# Patient Record
Sex: Male | Born: 1960
Health system: Southern US, Community
[De-identification: ages and names within clinical notes are randomized; demographics above are authoritative.]

## PROBLEM LIST (undated history)

## (undated) DIAGNOSIS — Z8709 Personal history of other diseases of the respiratory system: Secondary | ICD-10-CM

## (undated) DIAGNOSIS — J189 Pneumonia, unspecified organism: Secondary | ICD-10-CM

## (undated) DIAGNOSIS — C61 Malignant neoplasm of prostate: Secondary | ICD-10-CM

## (undated) DIAGNOSIS — A419 Sepsis, unspecified organism: Secondary | ICD-10-CM

## (undated) DIAGNOSIS — M1990 Unspecified osteoarthritis, unspecified site: Secondary | ICD-10-CM

## (undated) DIAGNOSIS — Z87440 Personal history of urinary (tract) infections: Secondary | ICD-10-CM

## (undated) DIAGNOSIS — K8020 Calculus of gallbladder without cholecystitis without obstruction: Secondary | ICD-10-CM

## (undated) DIAGNOSIS — R079 Chest pain, unspecified: Secondary | ICD-10-CM

## (undated) DIAGNOSIS — E7800 Pure hypercholesterolemia, unspecified: Secondary | ICD-10-CM

## (undated) DIAGNOSIS — Z87442 Personal history of urinary calculi: Secondary | ICD-10-CM

## (undated) DIAGNOSIS — G4730 Sleep apnea, unspecified: Secondary | ICD-10-CM

## (undated) HISTORY — DX: Chest pain, unspecified: R07.9

## (undated) HISTORY — DX: Pure hypercholesterolemia, unspecified: E78.00

---

## 2000-12-23 ENCOUNTER — Encounter: Admission: RE | Admit: 2000-12-23 | Discharge: 2000-12-23 | Payer: Self-pay | Admitting: Urology

## 2000-12-23 ENCOUNTER — Encounter: Payer: Self-pay | Admitting: Urology

## 2001-01-14 ENCOUNTER — Encounter: Payer: Self-pay | Admitting: Urology

## 2001-01-14 ENCOUNTER — Ambulatory Visit (HOSPITAL_COMMUNITY): Admission: RE | Admit: 2001-01-14 | Discharge: 2001-01-14 | Payer: Self-pay | Admitting: Urology

## 2006-07-14 ENCOUNTER — Ambulatory Visit (HOSPITAL_BASED_OUTPATIENT_CLINIC_OR_DEPARTMENT_OTHER): Admission: RE | Admit: 2006-07-14 | Discharge: 2006-07-14 | Payer: Self-pay | Admitting: Orthopedic Surgery

## 2013-07-03 ENCOUNTER — Encounter: Payer: Self-pay | Admitting: *Deleted

## 2013-07-03 ENCOUNTER — Ambulatory Visit (INDEPENDENT_AMBULATORY_CARE_PROVIDER_SITE_OTHER): Payer: 59 | Admitting: Cardiovascular Disease

## 2013-07-03 ENCOUNTER — Encounter: Payer: Self-pay | Admitting: Cardiovascular Disease

## 2013-07-03 ENCOUNTER — Ambulatory Visit (INDEPENDENT_AMBULATORY_CARE_PROVIDER_SITE_OTHER)
Admission: RE | Admit: 2013-07-03 | Discharge: 2013-07-03 | Disposition: A | Discharge status: Home / Self Care | Payer: 59 | Source: Ambulatory Visit | Attending: Cardiovascular Disease | Admitting: Cardiovascular Disease

## 2013-07-03 VITALS — BP 120/80 | HR 78 | Ht 73.0 in | Wt 234.0 lb

## 2013-07-03 DIAGNOSIS — R079 Chest pain, unspecified: Secondary | ICD-10-CM

## 2013-07-03 DIAGNOSIS — E7800 Pure hypercholesterolemia, unspecified: Secondary | ICD-10-CM | POA: Insufficient documentation

## 2013-07-03 DIAGNOSIS — F17200 Nicotine dependence, unspecified, uncomplicated: Secondary | ICD-10-CM

## 2013-07-03 NOTE — Assessment & Plan Note (Signed)
Cholesterol is at goal.  Continue current dose of statin and diet Rx.  No myalgias or side effects.  F/U  LFT's in 6 months. No results found for this basename: [place]  See HPI target ok if no vascular disease and ETT normal

## 2013-07-03 NOTE — Assessment & Plan Note (Signed)
Counseled for less than 10 minutes Difficult to quit f/u Alroy Dust CXR today

## 2013-07-03 NOTE — Progress Notes (Signed)
Patient ID: Xavier Goodwin, male   DOB: April 10, 1960, 53 y.o.   MRN: [id number]   53 yo referred by [name] for chest pain  Present for 2-3 weeks.  Sharp pressing pain radiates to left shoulder Occasionally accompanied by dyspnea Last only minutes.  Not always exertional No pleuritic or positional component No GI overtones.  CRF;s family history and elevated lipids on ASA and statin   Smoker has used e cigs to try to Quit but gave him headache.  Has not had CXR in a long time   Reviewed labs from 02/07/13  Normal LFTls  LDL 129  TC 191 HDL 45     ROS: Denies fever, malais, weight loss, blurry vision, decreased visual acuity, cough, sputum, SOB, hemoptysis, pleuritic pain, palpitaitons, heartburn, abdominal pain, melena, lower extremity edema, claudication, or rash.  All other systems reviewed and negative   General: Affect appropriate Healthy:  appears stated age 59: normal Neck supple with no adenopathy JVP normal no bruits no thyromegaly Lungs clear with no wheezing and good diaphragmatic motion Heart:  S1/S2 no murmur,rub, gallop or click PMI normal Abdomen: benighn, BS positve, no tenderness, no AAA no bruit.  No HSM or HJR Distal pulses intact with no bruits No edema Neuro non-focal Skin warm and dry No muscular weakness  Medications Current Outpatient Prescriptions  Medication Sig Dispense Refill  . aspirin 81 MG tablet Take 81 mg by mouth as needed for pain.      Marland Kitchen CRESTOR 40 MG tablet Take 1 tablet by mouth daily.       No current facility-administered medications for this visit.    Allergies Review of patient's allergies indicates no known allergies.  Family History: Family History  Problem Relation Age of Onset  . Hypertension Father   . Diabetes Mother   . Cancer Mother   . Lupus Sister     Social History: History   Social History  . Marital Status: Married    Spouse Name: N/A    Number of Children: N/A  . Years of Education: N/A    Occupational History  . Not on file.   Social History Main Topics  . Smoking status: Current Every Day Smoker  . Smokeless tobacco: Not on file  . Alcohol Use: Yes  . Drug Use: No  . Sexual Activity: Not on file   Other Topics Concern  . Not on file   Social History Narrative  . No narrative on file    Electrocardiogram:  SR rate 70 early repolarization   Assessment and Plan

## 2013-07-03 NOTE — Assessment & Plan Note (Signed)
Atypical multiple CRFls  F/u ETT since ECG is normal

## 2013-07-03 NOTE — Patient Instructions (Signed)
Your physician wants you to follow-up in: [place] will receive a reminder letter in the mail two months in advance. If you don't receive a letter, please call our office to schedule the follow-up appointment.  Your physician recommends that you continue on your current medications as directed. Please refer to the Current Medication list given to you today.  A chest x-ray takes a picture of the organs and structures inside the chest, including the heart, lungs, and blood vessels. This test can show several things, including, whether the heart is enlarges; whether fluid is building up in the lungs; and whether pacemaker / defibrillator leads are still in place.  Your physician has requested that you have an exercise tolerance test. For further information please visit [email or web address]. Please also follow instruction sheet, as given.

## 2013-07-05 ENCOUNTER — Ambulatory Visit (HOSPITAL_COMMUNITY)
Admission: RE | Admit: 2013-07-05 | Discharge: 2013-07-05 | Disposition: A | Discharge status: Home / Self Care | Payer: 59 | Source: Ambulatory Visit | Attending: Cardiovascular Disease | Admitting: Cardiovascular Disease

## 2013-07-05 DIAGNOSIS — R079 Chest pain, unspecified: Secondary | ICD-10-CM

## 2013-07-10 ENCOUNTER — Telehealth: Payer: Self-pay | Admitting: *Deleted

## 2013-07-10 DIAGNOSIS — R9439 Abnormal result of other cardiovascular function study: Secondary | ICD-10-CM

## 2013-07-10 NOTE — Telephone Encounter (Signed)
[name] ','<More Detail >>       Josue Hector, MD       Sent: Sun July 09, 2013 12:22 PM    To: Richmond Campbell, LPN                   Message     ETT abnormal F/U stress [facility] Xavier Goodwin  MRN: [id number]  07/05/2013 Hospital Visit         Patient Demographics and Encounter Information      Patient Demographics     Patient Name Sex DOB Age SSN Address Phone    Xavier Goodwin, Xavier Goodwin Male 1960-07-07 (53 y.o.) [id number] [place] [address] [phone] (Home) [phone] (Work)           Hospital Account# [id number]     [facility]           Progress Notes     No notes of this type exist for this encounter.          Encounter-Level Documents:     Electronic signature on 07/05/2013 3:13 PM    Electronic signature on 07/05/2013 3:14 PM    Electronic signature on 07/05/2013 3:14 PM           ED Chart Summary     ED Chart Summary Report           Reason for Admission     Chest pain  786.50           Admission Information     Attending Provider Admitting Provider Admission Type Admission Date/Time      Elective 07/05/13 1519     Discharge Date Hospital Service Auth/Cert Status Service Area    07/05/13   [place]     Unit Room/Bed Admission Status Referring Provider    [facility] Outpatient Visit (Completed) Josue Hector, MD     [facility]    Physician or Clinic Referral                Events     Date/Time Event Pt Class Unit Room/Bed Service    07/05/13 [facility] Outpatient Outpatient [facility]       07/05/13 2359 Discharge Outpatient [facility] [place]              Discharge Information - Patient Record Only     Discharge Date/Time Discharge Disposition Discharge Destination Discharge Provider Unit    07/05/2013  2359 01-home Or Self Care None None [facility] Im[facility]           Problem List Never Reviewed         ICD-9-CM Priority Class Noted - Resolved    Hypercholesteremia 272.0   Unknown - Present         Entered by Burnett Kanaris, RMA    Smoker 305.1   07/03/2013 - Present         Entered by Jenkins Rouge, MD    Chest pain 786.50   07/03/2013 - Present         Entered by Jenkins Rouge, MD           Final Diagnoses (ICD-9-CM)     Principal Code Name  POA CC HAC Affects DRG    [P] 786.50 Chest pain, unspecified               Treatment Team     Treatment Team            Documentation and Orders      Allergies as of 07/05/2013 Review Complete On: 07/03/2013 By: Burnett Kanaris, RMA     No Known Allergies          PTA Med History     PTA Med History Report           Medical Past Medical History Date Comments Source   Hypercholesteremia [272.0]   Provider   Chest pain [786.50]   Provider          Surgical **None**            Family Problem Relation Name Age of Onset Comments Source   Hypertension Father    Provider   Diabetes Mother    Provider   Cancer Mother    Provider   Lupus Sister    Provider          Family Status **None**            Tobacco Use Smoking Status Source Types Packs/day Years Used Comments Smoking Start Date Smoking Quit Date Smokeless Tobacco Status Smokeless Tobacco Quit Date   Current Every Day Smoker Provider  0.0 0.0    Unknown           Alcohol Use Alcohol Use Source Drinks/Week Alcohol/Wk Comments   Yes Provider             Drug Use Drug Use Source Types Frequency Comments   No Provider  0.00           Sexual Activity Sexually Active Source Birth Control Partners Comments   Not Asked Provider             Social ADL ADL Question Response Comments Source   **None**          Social Doc **None**            Occupational **None**            Socioeconomic Marital Status  Spouse Name Num of Children Years Education Source   Married       Preferred Language Ethnicity Race     English Not Hispanic or Latino Black or African American          Birth **None**         Discharge Summary Note     Discharge Summary          H&P Notes     No notes of this type exist for this encounter.          Procedure Notes     No notes of this type exist for this encounter.          Consult Notes     No notes of this type exist for this encounter.          Note Information     No notes recorded in this admission.          OR Perioperative Record     OR Perioperative Record          Anesthesia Record Report     Anesthesia Record          All Flowsheet Templates (all recorded)     None  Order Information     Orders Report Results Report          MAR History by Date Range      All administrations    No administration data available           Isolation     No Isolation          Care Plan     Care Plan Report          Patient Education     Patient Education Report          Documents on File       Status Date Received Description    Documents for the Patient    [place] HIPAA NOTICE OF PRIVACY - Scanned Not Received      [place] E-Signature HIPAA Notice of Privacy       Driver's License Not Received      Insurance Card Not Received      Advance Directives/Living Will/HCPOA/POA Not Received      Insurance Card       Insurance Card Received 07/03/13     [place] E-Signature HIPAA Notice of Privacy Received 07/03/13 [facility]/CARD    Documents for the Encounter    AOB (Assignment of Insurance Benefits) Not Received      E-signature AOB Received 07/05/13     MEDICARE RIGHTS Not Received      E-signature Medicare       E-Signature Physician Office Financial Release of Information Received 07/05/13     [facility] Received 07/05/13              Discharge Documentation and Orders      Discharge Orders     Discharge Orders          Discharge Instructions     Discharge Instructions          Discharge AVS Without Meds     Discharge AVS: Without Discharge Medications Frozen at Time of Last AVS Print           Scanned Information      Encounter-Level Documents:     There are no encounter-level documents.          Order-Level Documents - 07/05/13:     Stress Test - Scan on 07/06/2013 10:42 AM : protocol and patient data sheetsStress Test - Scan on 07/06/2013 10:42 AM : protocol and patient data sheets    Stress Test - Scan on 07/06/2013 10:42 AM : consent formStress Test - Scan on 07/06/2013 10:42 AM : consent form    Stress Test - Scan on 07/06/2013 10:43 AM : resting EKGStress Test - Scan on 07/06/2013 10:43 AM : resting EKG           Patient-Level Documents:     Insurance Card - Scan on 07/03/2013 10:32 AMInsurance Card - Scan on 07/03/2013 10:32 AM            Left message  For pt  To call back res  Stress results ./cy

## 2013-07-11 NOTE — Telephone Encounter (Signed)
Patient is returning your call, Please call back on his cell phone [phone]

## 2013-07-11 NOTE — Telephone Encounter (Signed)
Mail box full at [phone], unable to leave msg

## 2013-07-11 NOTE — Telephone Encounter (Signed)
Pt was called and stress test was explained, accepting of plan.

## 2013-07-11 NOTE — Telephone Encounter (Signed)
Called home number, wife took msg, our office will call today or tomorrow to schedule a stress test. She will update spouse. His mailbox was full/ unable to leave msg.  Paperwork taken to check out to schedule.

## 2013-07-11 NOTE — Telephone Encounter (Signed)
New problem   Pt returning a call from [employer] from yesterday.

## 2013-07-25 ENCOUNTER — Ambulatory Visit (HOSPITAL_COMMUNITY): Payer: 59 | Attending: Cardiovascular Disease | Admitting: Radiology

## 2013-07-25 VITALS — BP 98/73 | Ht 73.0 in | Wt 230.0 lb

## 2013-07-25 DIAGNOSIS — R079 Chest pain, unspecified: Secondary | ICD-10-CM

## 2013-07-25 DIAGNOSIS — R0602 Shortness of breath: Secondary | ICD-10-CM

## 2013-07-25 DIAGNOSIS — R9439 Abnormal result of other cardiovascular function study: Secondary | ICD-10-CM | POA: Insufficient documentation

## 2013-07-25 MED ORDER — TECHNETIUM TC 99M SESTAMIBI GENERIC - CARDIOLITE
30.0000 | Freq: Once | INTRAVENOUS | Status: AC | PRN
  Administered 2013-07-25: 30 via INTRAVENOUS

## 2013-07-25 MED ORDER — TECHNETIUM TC 99M SESTAMIBI GENERIC - CARDIOLITE
10.0000 | Freq: Once | INTRAVENOUS | Status: AC | PRN
  Administered 2013-07-25: 10 via INTRAVENOUS

## 2013-07-25 NOTE — Progress Notes (Signed)
Xavier Goodwin, Xavier Goodwin is a 53 y.o. male     MRN : [id number]     DOB: 1960-06-10  Procedure Date: 07/25/2013  Nuclear Med Background Indication for Stress Test:  Evaluation for Ischemia and 4/15 Abnormal GXT History:  No Hx of CAD Cardiac Risk Factors: Family History - CAD, Lipids and Smoker  Symptoms:  Chest Pain, DOE and SOB   Nuclear Pre-Procedure Caffeine/Decaff Intake:  None NPO After: 8:00am   Lungs:  clear O2 Sat: 95% on room air. IV 0.9% NS with Angio Cath:  22g  IV Site: R Antecubital  IV Started by:  Crissie Figures, RN  Chest Size (in):  46 Cup Size: n/a  Height: 6\' 1"  (1.854 m)  Weight:  230 lb (104.327 kg)  BMI:  Body mass index is 30.35 kg/(m^2). Tech Comments:  N/A    Nuclear Med Study 1 or 2 day study: 1 day  Stress Test Type:  Stress  Reading MD: N/A  Order Authorizing Provider:  Jenkins Rouge, MD  Resting Radionuclide: Technetium 76m Sestamibi  Resting Radionuclide Dose: 11.0 mCi   Stress Radionuclide:  Technetium 59m Sestamibi  Stress Radionuclide Dose: 33.0 mCi           Stress Protocol Rest HR: 72 Stress HR: 150  Rest BP: 98/73 Stress BP: 143/77  Exercise Time (min): 10:00 METS: 11.70   Predicted Max HR: 166 bpm % Max HR: 90.36 bpm Rate Pressure Product: 22500   Dose of Adenosine (mg):  n/a Dose of Lexiscan: n/a mg  Dose of Atropine (mg): n/a Dose of Dobutamine: n/a mcg/kg/min (at max HR)  Stress Test Technologist: Perrin Maltese, EMT-P  Nuclear Technologist:     Rest Procedure:  Myocardial perfusion imaging was performed at rest 45 minutes following the intravenous administration of Technetium 63m Sestamibi. Rest ECG: NSR - Normal EKG  Stress Procedure:  The patient exercised on the treadmill utilizing the Bruce Protocol for 10:00 minutes. The patient stopped due to fatigue and denied any chest pain.  Technetium 1m  Sestamibi was injected at peak exercise and myocardial perfusion imaging was performed after a brief delay. Stress ECG: No significant change from baseline ECG  QPS Raw Data Images:  There is interference from nuclear activity from structures below the diaphragm. This does not affect the ability to read the study. Stress Images:  Normal homogeneous uptake in all areas of the myocardium. Rest Images:  Normal homogeneous uptake in all areas of the myocardium. Subtraction (SDS):  No evidence of ischemia. Transient Ischemic Dilatation (Normal <1.22):  0.95 Lung/Heart Ratio (Normal <0.45):  0.34  Quantitative Gated Spect Images QGS EDV:  141 ml QGS ESV:  70 ml  Impression Exercise Capacity:  Good exercise capacity. BP Response:  Normal blood pressure response. Clinical Symptoms:  No significant symptoms noted. ECG Impression:  No significant ST segment change suggestive of ischemia. Comparison with Prior Nuclear Study: No images to compare  Overall Impression:  Normal stress nuclear study.  LV Ejection Fraction: 51%.  LV Wall Motion:  NL LV Function; NL Wall Motion  Xavier Goodwin 07/25/2013

## 2014-06-28 ENCOUNTER — Ambulatory Visit: Payer: Self-pay | Admitting: Cardiovascular Disease

## 2014-07-11 NOTE — Progress Notes (Signed)
Patient ID: Xavier Goodwin, male   DOB: 1960/07/21, 54 y.o.   MRN: [id number]   54 y.o.  referred by [name] for chest pain in 2015    Present for 2-3 weeks.  Sharp pressing pain radiates to left shoulder Occasionally accompanied by dyspnea Last only minutes.  Not always exertional No pleuritic or positional component No GI overtones.  CRF;s family history and elevated lipids on ASA and statin   Smoker has used e cigs to try to Quit but gave him headache.  Has not had CXR in a long time   Reviewed labs from 02/07/13  Normal LFTls  LDL 129  TC 191 HDL 45  Recent ones with Francesca Oman  F/U stress myovue  422/2015 reviewed and normal with EF 51%    Son lives in [place]  Will have first grand baby boy due next week   ROS: Denies fever, malais, weight loss, blurry vision, decreased visual acuity, cough, sputum, SOB, hemoptysis, pleuritic pain, palpitaitons, heartburn, abdominal pain, melena, lower extremity edema, claudication, or rash.  All other systems reviewed and negative   General: Affect appropriate Healthy:  appears stated age 54: normal Neck supple with no adenopathy JVP normal no bruits no thyromegaly Lungs clear with no wheezing and good diaphragmatic motion Heart:  S1/S2 no murmur,rub, gallop or click PMI normal Abdomen: benighn, BS positve, no tenderness, no AAA no bruit.  No HSM or HJR Distal pulses intact with no bruits No edema Neuro non-focal Skin warm and dry No muscular weakness  Medications Current Outpatient Prescriptions  Medication Sig Dispense Refill  . aspirin 81 MG tablet Take 81 mg by mouth as needed for pain.    Marland Kitchen CRESTOR 40 MG tablet Take 1 tablet by mouth daily.     No current facility-administered medications for this visit.    Allergies Review of patient's allergies indicates no known allergies.  Family History: Family History  Problem Relation Age of Onset  . Hypertension Father   . Diabetes Mother   . Cancer Mother   .  Lupus Sister     Social History: History   Social History  . Marital Status: Married    Spouse Name: N/A  . Number of Children: N/A  . Years of Education: N/A   Occupational History  . Not on file.   Social History Main Topics  . Smoking status: Current Every Day Smoker  . Smokeless tobacco: Not on file  . Alcohol Use: Yes  . Drug Use: No  . Sexual Activity: Not on file   Other Topics Concern  . Not on file   Social History Narrative  . No narrative on file    Electrocardiogram:   07/26/13  SR rate 70 early repolarization   07/12/14  NSR rate 79 normal   Assessment and Plan

## 2014-07-12 ENCOUNTER — Encounter: Payer: Self-pay | Admitting: Cardiovascular Disease

## 2014-07-12 ENCOUNTER — Ambulatory Visit (INDEPENDENT_AMBULATORY_CARE_PROVIDER_SITE_OTHER): Payer: 59 | Admitting: Cardiovascular Disease

## 2014-07-12 VITALS — BP 108/70 | HR 79 | Ht 73.0 in | Wt 236.6 lb

## 2014-07-12 DIAGNOSIS — R079 Chest pain, unspecified: Secondary | ICD-10-CM | POA: Diagnosis not present

## 2014-07-12 DIAGNOSIS — E7800 Pure hypercholesterolemia, unspecified: Secondary | ICD-10-CM

## 2014-07-12 NOTE — Assessment & Plan Note (Signed)
Resolved atypical  Normal exam and ECG  Stable no need for testing

## 2014-07-12 NOTE — Assessment & Plan Note (Signed)
Cholesterol is at goal.  Continue current dose of statin and diet Rx.  No myalgias or side effects.  F/U  LFT's in 6 months. No results found for: LDLCALC           

## 2014-07-12 NOTE — Patient Instructions (Signed)
Your physician wants you to follow-up in: YEAR WITH DR NISHAN  You will receive a reminder letter in the mail two months in advance. If you don't receive a letter, please call our office to schedule the follow-up appointment.  Your physician recommends that you continue on your current medications as directed. Please refer to the Current Medication list given to you today. 

## 2015-10-08 NOTE — Progress Notes (Signed)
Patient ID: Xavier Goodwin, male   DOB: Mar 02, 1961, 55 y.o.   MRN: [id number]   55 y.o.  referred by [name] for chest pain in 2015    Present for 2-3 weeks.  Sharp pressing pain radiates to left shoulder Occasionally accompanied by dyspnea Last only minutes.  Not always exertional No pleuritic or positional component No GI overtones.  CRF;s family history and elevated lipids on ASA and statin   Smoker has used e cigs to try to Quit but gave him headache.  Has not had CXR in a long time   Reviewed labs from 02/07/13  Normal LFTls  LDL 129  TC 191 HDL 45  Recent ones with Francesca Oman  F/U stress myovue  07/26/2013 reviewed and normal with EF 51%    Son lives in [place]  First grand baby 2016   ROS: Denies fever, malais, weight loss, blurry vision, decreased visual acuity, cough, sputum, SOB, hemoptysis, pleuritic pain, palpitaitons, heartburn, abdominal pain, melena, lower extremity edema, claudication, or rash.  All other systems reviewed and negative   General: Affect appropriate Healthy:  appears stated age 55: normal Neck supple with no adenopathy JVP normal no bruits no thyromegaly Lungs clear with no wheezing and good diaphragmatic motion Heart:  S1/S2 no murmur,rub, gallop or click PMI normal Abdomen: benighn, BS positve, no tenderness, no AAA no bruit.  No HSM or HJR Distal pulses intact with no bruits No edema Neuro non-focal Skin warm and dry No muscular weakness  Medications Current Outpatient Prescriptions  Medication Sig Dispense Refill  . aspirin 81 MG tablet Take 81 mg by mouth as needed for pain.    Marland Kitchen CRESTOR 40 MG tablet Take 1 tablet by mouth daily.    Marland Kitchen VIAGRA 100 MG tablet Take 25 mg by mouth daily as needed. For ED     No current facility-administered medications for this visit.    Allergies Review of patient's allergies indicates no known allergies.  Family History: Family History  Problem Relation Age of Onset  . Hypertension Father    . Heart attack Father   . Diabetes Mother   . Cancer Mother   . Lupus Sister     Social History: Social History   Social History  . Marital Status: Married    Spouse Name: N/A  . Number of Children: N/A  . Years of Education: N/A   Occupational History  . Not on file.   Social History Main Topics  . Smoking status: Current Every Day Smoker  . Smokeless tobacco: Not on file  . Alcohol Use: Yes  . Drug Use: No  . Sexual Activity: Not on file   Other Topics Concern  . Not on file   Social History Narrative    Electrocardiogram:   07/26/13  SR rate 70 early repolarization   07/12/14  NSR rate 79 normal 10/10/15 SR rate 73 insig Q waves infer and lateral   Assessment and Plan Chol:  On statin labs with Dr Alroy Dust Chest Pain: resolved normal stress myovue 07/25/13  see above discussed utility of calcium score he will schedule at future date Smoking:  counseled on cessation and risk of cancer and vascular disease  Jenkins Rouge

## 2015-10-10 ENCOUNTER — Ambulatory Visit (INDEPENDENT_AMBULATORY_CARE_PROVIDER_SITE_OTHER): Payer: Commercial Managed Care - HMO | Admitting: Cardiovascular Disease

## 2015-10-10 ENCOUNTER — Encounter: Payer: Self-pay | Admitting: Cardiovascular Disease

## 2015-10-10 VITALS — BP 110/80 | HR 82 | Ht 73.0 in | Wt 243.0 lb

## 2015-10-10 DIAGNOSIS — R0789 Other chest pain: Secondary | ICD-10-CM

## 2015-10-10 DIAGNOSIS — Z09 Encounter for follow-up examination after completed treatment for conditions other than malignant neoplasm: Secondary | ICD-10-CM

## 2015-10-10 NOTE — Patient Instructions (Addendum)
Medication Instructions:  Your physician recommends that you continue on your current medications as directed. Please refer to the Current Medication list given to you today.  Labwork: NONE  Testing/Procedures: Cardiac Ca Score CT scanning, (CAT scanning), is a noninvasive, special x-ray that produces cross-sectional images of the body using x-rays and a computer.   Follow-Up: Your physician wants you to follow-up in: 12 months with Dr. Johnsie Cancel. You will receive a reminder letter in the mail two months in advance. If you don't receive a letter, please call our office to schedule the follow-up appointment.   If you need a refill on your cardiac medications before your next appointment, please call your pharmacy.

## 2015-10-16 ENCOUNTER — Ambulatory Visit (INDEPENDENT_AMBULATORY_CARE_PROVIDER_SITE_OTHER)
Admission: RE | Admit: 2015-10-16 | Discharge: 2015-10-16 | Disposition: A | Discharge status: Home / Self Care | Payer: Self-pay | Source: Ambulatory Visit | Attending: Cardiovascular Disease | Admitting: Cardiovascular Disease

## 2015-10-16 DIAGNOSIS — R0789 Other chest pain: Secondary | ICD-10-CM

## 2015-10-22 ENCOUNTER — Telehealth: Payer: Self-pay | Admitting: Cardiovascular Disease

## 2015-10-22 NOTE — Telephone Encounter (Signed)
Follow Up:   Returning call from yesterday,she did not have a name.

## 2015-10-22 NOTE — Telephone Encounter (Signed)
Can f/u with me to discuss possible cath  I will be in office 10/29/15 as there are no TAVR;s

## 2015-10-22 NOTE — Telephone Encounter (Signed)
Called patient's wife (DPR) with results of Ca Score. Patient's wife stated patient is still having chest pain as often as twice a week, and she would like to know if there is anything else he can do about this chest pain. Wife states that patient is usually just sitting around when chest pain comes on. Patient is very active, and has not complained of chest pain during activity.  Will forward to Dr. Johnsie Cancel for further recommendations.   Notes Recorded by Josue Hector, MD on 10/21/2015 at 11:44 AM Calcium score 266 which is above average for age. Had normal stress test 07/2013 will repeat next year Continue statin and baby aspirin

## 2015-10-22 NOTE — Telephone Encounter (Addendum)
Called patient's wife back. Wife would like for me to call her back tomorrow, so they can discuss what they would like to do.   Called patient's wife back and patient is scheduled to come in 7/25 to discuss possible cath.

## 2015-10-28 NOTE — Progress Notes (Signed)
Patient ID: Xavier Goodwin, male   DOB: Dec 27, 1960, 55 y.o.   MRN: [id number]   55 y.o.  referred by [name] for chest pain in 2015    Present for 2-3 weeks.  Sharp pressing pain radiates to left shoulder Occasionally accompanied by dyspnea Last only minutes.  Not always exertional No pleuritic or positional component No GI overtones.  CRF;s family history and elevated lipids on ASA and statin   Smoker has used e cigs to try to Quit but gave him headache.  Has not had CXR in a long time   Reviewed labs from 02/07/13  Normal LFTls  LDL 129  TC 191 HDL 45  Recent ones with Francesca Oman  F/U stress myovue  07/26/2013 reviewed and normal with EF 51%    Son lives in [place]  First grand baby 2016   Calcium Score done 10/16/15 reviewed: 266 which was 81st percentile for age and sex  Drinks lots of sweet tea Discussed non sugar alternatives like crystal light    ROS: Denies fever, malais, weight loss, blurry vision, decreased visual acuity, cough, sputum, SOB, hemoptysis, pleuritic pain, palpitaitons, heartburn, abdominal pain, melena, lower extremity edema, claudication, or rash.  All other systems reviewed and negative   General: Affect appropriate Healthy:  appears stated age 56: normal Neck supple with no adenopathy JVP normal no bruits no thyromegaly Lungs clear with no wheezing and good diaphragmatic motion Heart:  S1/S2 no murmur,rub, gallop or click PMI normal Abdomen: benighn, BS positve, no tenderness, no AAA no bruit.  No HSM or HJR Distal pulses intact with no bruits No edema Neuro non-focal Skin warm and dry No muscular weakness  Medications Current Outpatient Prescriptions  Medication Sig Dispense Refill  . aspirin 81 MG tablet Take 81 mg by mouth as needed for pain.    Marland Kitchen CRESTOR 40 MG tablet Take 1 tablet by mouth daily.    Marland Kitchen VIAGRA 100 MG tablet Take 25 mg by mouth daily as needed. For ED     No current facility-administered medications for this  visit.     Allergies Review of patient's allergies indicates no known allergies.  Family History: Family History  Problem Relation Age of Onset  . Hypertension Father   . Heart attack Father   . Diabetes Mother   . Cancer Mother   . Lupus Sister     Social History: Social History   Social History  . Marital status: Married    Spouse name: N/A  . Number of children: N/A  . Years of education: N/A   Occupational History  . Not on file.   Social History Main Topics  . Smoking status: Current Every Day Smoker  . Smokeless tobacco: Not on file  . Alcohol use Yes  . Drug use: No  . Sexual activity: Not on file   Other Topics Concern  . Not on file   Social History Narrative  . No narrative on file    Electrocardiogram:   07/26/13  SR rate 70 early repolarization   07/12/14  NSR rate 79 normal 10/10/15 SR rate 73 insig Q waves infer and lateral   Assessment and Plan Chol:  On statin labs with Dr Alroy Dust Chest Pain: Atypical normal stress myovue 07/25/13  Elevated calcium score on statin and ASA F/u stress myovue  Smoking:  counseled on cessation and risk of cancer and vascular disease  Jenkins Rouge

## 2015-10-29 ENCOUNTER — Encounter: Payer: Self-pay | Admitting: Cardiovascular Disease

## 2015-10-29 ENCOUNTER — Ambulatory Visit (INDEPENDENT_AMBULATORY_CARE_PROVIDER_SITE_OTHER): Payer: Commercial Managed Care - HMO | Admitting: Cardiovascular Disease

## 2015-10-29 VITALS — BP 112/78 | HR 78 | Ht 73.0 in | Wt 245.0 lb

## 2015-10-29 DIAGNOSIS — R0789 Other chest pain: Secondary | ICD-10-CM

## 2015-10-29 NOTE — Patient Instructions (Addendum)

## 2015-11-05 ENCOUNTER — Telehealth (HOSPITAL_COMMUNITY): Payer: Self-pay | Admitting: *Deleted

## 2015-11-05 NOTE — Telephone Encounter (Signed)
Patient given detailed instructions per Myocardial Perfusion Study Information Sheet for the test on  11/07/15 Patient notified to arrive 15 minutes early and that it is imperative to arrive on time for appointment to keep from having the test rescheduled.  If you need to cancel or reschedule your appointment, please call the office within 24 hours of your appointment. Failure to do so may result in a cancellation of your appointment, and a $50 no show fee. Patient verbalized understanding. Hubbard Robinson, RN

## 2015-11-07 ENCOUNTER — Telehealth: Payer: Self-pay

## 2015-11-07 ENCOUNTER — Ambulatory Visit (HOSPITAL_COMMUNITY): Payer: Commercial Managed Care - HMO | Attending: Cardiovascular Disease

## 2015-11-07 DIAGNOSIS — R0789 Other chest pain: Secondary | ICD-10-CM | POA: Insufficient documentation

## 2015-11-07 DIAGNOSIS — R0609 Other forms of dyspnea: Secondary | ICD-10-CM | POA: Diagnosis not present

## 2015-11-07 DIAGNOSIS — R9439 Abnormal result of other cardiovascular function study: Secondary | ICD-10-CM | POA: Insufficient documentation

## 2015-11-07 DIAGNOSIS — Z01812 Encounter for preprocedural laboratory examination: Secondary | ICD-10-CM

## 2015-11-07 LAB — MYOCARDIAL PERFUSION IMAGING
CHL CUP NUCLEAR SDS: 7
CHL CUP RESTING HR STRESS: 62 {beats}/min
CSEPED: 10 min
CSEPEDS: 15 s
CSEPHR: 90 %
Estimated workload: 12.1 METS
LVDIAVOL: 159 mL (ref 62–150)
LVSYSVOL: 83 mL
MPHR: 163 {beats}/min
Peak HR: 148 {beats}/min
RATE: 0.23
SRS: 2
SSS: 9
TID: 1.03

## 2015-11-07 MED ORDER — NITROGLYCERIN 0.4 MG SL SUBL: 0.4000 mg | SUBLINGUAL_TABLET | SUBLINGUAL | 3 refills | Status: DC | PRN

## 2015-11-07 MED ORDER — TECHNETIUM TC 99M TETROFOSMIN IV KIT
10.8000 | PACK | Freq: Once | INTRAVENOUS | Status: AC | PRN
  Administered 2015-11-07: 11 via INTRAVENOUS
  Filled 2015-11-07: qty 11

## 2015-11-07 MED ORDER — TECHNETIUM TC 99M TETROFOSMIN IV KIT
32.1000 | PACK | Freq: Once | INTRAVENOUS | Status: AC | PRN
  Administered 2015-11-07: 32.1 via INTRAVENOUS
  Filled 2015-11-07: qty 32

## 2015-11-07 NOTE — Telephone Encounter (Signed)
Patient is aware of results of stress test. Sent in prescription for nitroglycerin. Informed patient that he cannot take Viagra with this medication. Patient verbalized understanding. Will call patient tomorrow with some dates to schedule heart cath.

## 2015-11-07 NOTE — Telephone Encounter (Signed)
-----   Message from Josue Hector, MD sent at 11/07/2015  5:46 PM EDT ----- Abnormal myovue call in nitro can arrange cath or have him see me to discuss

## 2015-11-08 NOTE — Telephone Encounter (Signed)
Left message for patient to call back. Need to go over instructions for heart cath with patient. Patient will need an appointment to have lab work done.

## 2015-11-11 NOTE — Telephone Encounter (Signed)
Follow Up Call  Returning Call . Please call Cel number

## 2015-11-11 NOTE — Telephone Encounter (Signed)
Spoke with pt and his wife. Pt given date and time of scheduled cardiac cath with Dr.Nishan 11/25/15 @ 7:30am. Pt adv to arrive at [facility] at 5:30am. Pt will come to the office on 8/16 for pre-procedure labs. Pt given verbal pre-procedure instructions, pt will pick up written instructions when he comes in for his lab appt.   Pt and pt's wife verbalized understanding to all instruction given, they have no questions at this time

## 2015-11-12 NOTE — Telephone Encounter (Signed)
Sent message to pre cert and Dr. Johnsie Cancel of date and procedure.

## 2015-11-13 ENCOUNTER — Other Ambulatory Visit: Payer: Self-pay | Admitting: Cardiovascular Disease

## 2015-11-13 DIAGNOSIS — I209 Angina pectoris, unspecified: Secondary | ICD-10-CM

## 2015-11-20 ENCOUNTER — Other Ambulatory Visit: Payer: Commercial Managed Care - HMO | Admitting: *Deleted

## 2015-11-20 DIAGNOSIS — Z01812 Encounter for preprocedural laboratory examination: Secondary | ICD-10-CM

## 2015-11-20 LAB — BASIC METABOLIC PANEL
BUN: 11 mg/dL (ref 7–25)
CO2: 25 mmol/L (ref 20–31)
Calcium: 9.7 mg/dL (ref 8.6–10.3)
Chloride: 110 mmol/L (ref 98–110)
Creat: 0.89 mg/dL (ref 0.70–1.33)
GLUCOSE: 96 mg/dL (ref 65–99)
Potassium: 3.9 mmol/L (ref 3.5–5.3)
Sodium: 141 mmol/L (ref 135–146)

## 2015-11-20 LAB — CBC WITH DIFFERENTIAL/PLATELET
BASOS ABS: 0 {cells}/uL (ref 0–200)
Basophils Relative: 0 %
EOS ABS: 85 {cells}/uL (ref 15–500)
Eosinophils Relative: 1 %
HEMATOCRIT: 40.9 % (ref 38.5–50.0)
Hemoglobin: 14.1 g/dL (ref 13.2–17.1)
LYMPHS PCT: 29 %
Lymphs Abs: 2465 cells/uL (ref 850–3900)
MCH: 34 pg — AB (ref 27.0–33.0)
MCHC: 34.5 g/dL (ref 32.0–36.0)
MCV: 98.6 fL (ref 80.0–100.0)
MONOS PCT: 8 %
MPV: 10.7 fL (ref 7.5–12.5)
Monocytes Absolute: 680 cells/uL (ref 200–950)
NEUTROS PCT: 62 %
Neutro Abs: 5270 cells/uL (ref 1500–7800)
Platelets: 182 10*3/uL (ref 140–400)
RBC: 4.15 MIL/uL — ABNORMAL LOW (ref 4.20–5.80)
RDW: 13.7 % (ref 11.0–15.0)
WBC: 8.5 10*3/uL (ref 3.8–10.8)

## 2015-11-20 LAB — PROTIME-INR
INR: 1
Prothrombin Time: 10.5 s (ref 9.0–11.5)

## 2015-11-21 LAB — BRAIN NATRIURETIC PEPTIDE: Brain Natriuretic Peptide: 10.5 pg/mL (ref <100)

## 2015-11-25 ENCOUNTER — Ambulatory Visit (HOSPITAL_COMMUNITY)
Admission: RE | Admit: 2015-11-25 | Discharge: 2015-11-25 | Disposition: A | Discharge status: Home / Self Care | Payer: Commercial Managed Care - HMO | Source: Ambulatory Visit | Attending: Cardiovascular Disease | Admitting: Cardiovascular Disease

## 2015-11-25 ENCOUNTER — Encounter (HOSPITAL_COMMUNITY): Payer: Self-pay | Admitting: Cardiovascular Disease

## 2015-11-25 ENCOUNTER — Ambulatory Visit (HOSPITAL_COMMUNITY)
Admission: RE | Disposition: A | Discharge status: Home / Self Care | Payer: Self-pay | Source: Ambulatory Visit | Attending: Cardiovascular Disease

## 2015-11-25 DIAGNOSIS — I2510 Atherosclerotic heart disease of native coronary artery without angina pectoris: Secondary | ICD-10-CM | POA: Insufficient documentation

## 2015-11-25 DIAGNOSIS — F17210 Nicotine dependence, cigarettes, uncomplicated: Secondary | ICD-10-CM | POA: Insufficient documentation

## 2015-11-25 DIAGNOSIS — Z833 Family history of diabetes mellitus: Secondary | ICD-10-CM | POA: Diagnosis not present

## 2015-11-25 DIAGNOSIS — I209 Angina pectoris, unspecified: Secondary | ICD-10-CM

## 2015-11-25 DIAGNOSIS — R0789 Other chest pain: Secondary | ICD-10-CM | POA: Diagnosis not present

## 2015-11-25 DIAGNOSIS — I2584 Coronary atherosclerosis due to calcified coronary lesion: Secondary | ICD-10-CM | POA: Insufficient documentation

## 2015-11-25 DIAGNOSIS — Z7982 Long term (current) use of aspirin: Secondary | ICD-10-CM | POA: Insufficient documentation

## 2015-11-25 DIAGNOSIS — Z8249 Family history of ischemic heart disease and other diseases of the circulatory system: Secondary | ICD-10-CM | POA: Diagnosis not present

## 2015-11-25 HISTORY — PX: CARDIAC CATHETERIZATION: SHX172

## 2015-11-25 LAB — GLUCOSE, CAPILLARY: GLUCOSE-CAPILLARY: 102 mg/dL — AB (ref 65–99)

## 2015-11-25 SURGERY — LEFT HEART CATH AND CORONARY ANGIOGRAPHY
Anesthesia: LOCAL

## 2015-11-25 MED ORDER — MIDAZOLAM HCL 2 MG/2ML IJ SOLN
INTRAMUSCULAR | Status: AC
  Filled 2015-11-25: qty 2

## 2015-11-25 MED ORDER — DIAZEPAM 2 MG PO TABS: 2.0000 mg | ORAL_TABLET | Freq: Four times a day (QID) | ORAL | Status: DC | PRN

## 2015-11-25 MED ORDER — FENTANYL CITRATE (PF) 100 MCG/2ML IJ SOLN
INTRAMUSCULAR | Status: DC | PRN
  Administered 2015-11-25: 50 ug via INTRAVENOUS

## 2015-11-25 MED ORDER — SODIUM CHLORIDE 0.9% FLUSH
3.0000 mL | Freq: Two times a day (BID) | INTRAVENOUS | Status: DC
Start: 2015-11-25 — End: 2015-11-25

## 2015-11-25 MED ORDER — HEPARIN SODIUM (PORCINE) 1000 UNIT/ML IJ SOLN
INTRAMUSCULAR | Status: AC
  Filled 2015-11-25: qty 1

## 2015-11-25 MED ORDER — HEPARIN (PORCINE) IN NACL 2-0.9 UNIT/ML-% IJ SOLN
INTRAMUSCULAR | Status: DC | PRN
  Administered 2015-11-25: 1500 mL

## 2015-11-25 MED ORDER — ASPIRIN 81 MG PO CHEW: 81.0000 mg | CHEWABLE_TABLET | ORAL | Status: DC

## 2015-11-25 MED ORDER — VERAPAMIL HCL 2.5 MG/ML IV SOLN
INTRAVENOUS | Status: DC | PRN
  Administered 2015-11-25: 10 mL via INTRA_ARTERIAL

## 2015-11-25 MED ORDER — IOPAMIDOL (ISOVUE-370) INJECTION 76%
INTRAVENOUS | Status: DC | PRN
  Administered 2015-11-25: 60 mL via INTRA_ARTERIAL

## 2015-11-25 MED ORDER — FENTANYL CITRATE (PF) 100 MCG/2ML IJ SOLN
INTRAMUSCULAR | Status: AC
  Filled 2015-11-25: qty 2

## 2015-11-25 MED ORDER — LIDOCAINE HCL (PF) 1 % IJ SOLN
INTRAMUSCULAR | Status: DC | PRN
  Administered 2015-11-25: 2 mL via INTRADERMAL

## 2015-11-25 MED ORDER — OXYCODONE-ACETAMINOPHEN 5-325 MG PO TABS: 1.0000 | ORAL_TABLET | ORAL | Status: DC | PRN

## 2015-11-25 MED ORDER — SODIUM CHLORIDE 0.9% FLUSH: 3.0000 mL | Freq: Two times a day (BID) | INTRAVENOUS | Status: DC

## 2015-11-25 MED ORDER — SODIUM CHLORIDE 0.9 % IV SOLN: 250.0000 mL | INTRAVENOUS | Status: DC | PRN

## 2015-11-25 MED ORDER — NITROGLYCERIN 1 MG/10 ML FOR IR/CATH LAB
INTRA_ARTERIAL | Status: AC
  Filled 2015-11-25: qty 10

## 2015-11-25 MED ORDER — SODIUM CHLORIDE 0.9 % WEIGHT BASED INFUSION
3.0000 mL/kg/h | INTRAVENOUS | Status: AC
  Administered 2015-11-25: 3 mL/kg/h via INTRAVENOUS

## 2015-11-25 MED ORDER — SODIUM CHLORIDE 0.9 % WEIGHT BASED INFUSION: 3.0000 mL/kg/h | INTRAVENOUS | Status: DC

## 2015-11-25 MED ORDER — SODIUM CHLORIDE 0.9 % WEIGHT BASED INFUSION: 1.0000 mL/kg/h | INTRAVENOUS | Status: DC

## 2015-11-25 MED ORDER — MIDAZOLAM HCL 2 MG/2ML IJ SOLN
INTRAMUSCULAR | Status: DC | PRN
  Administered 2015-11-25: 2 mg via INTRAVENOUS

## 2015-11-25 MED ORDER — SODIUM CHLORIDE 0.9% FLUSH: 3.0000 mL | INTRAVENOUS | Status: DC | PRN

## 2015-11-25 MED ORDER — LIDOCAINE HCL (PF) 1 % IJ SOLN
INTRAMUSCULAR | Status: AC
  Filled 2015-11-25: qty 30

## 2015-11-25 MED ORDER — HEPARIN SODIUM (PORCINE) 1000 UNIT/ML IJ SOLN
INTRAMUSCULAR | Status: DC | PRN
  Administered 2015-11-25: 4500 [IU] via INTRAVENOUS

## 2015-11-25 MED ORDER — HEPARIN (PORCINE) IN NACL 2-0.9 UNIT/ML-% IJ SOLN
INTRAMUSCULAR | Status: AC
  Filled 2015-11-25: qty 1000

## 2015-11-25 SURGICAL SUPPLY — 11 items
CATH INFINITI 5 FR JL3.5 (CATHETERS) ×1 IMPLANT
CATH INFINITI 5FR ANG PIGTAIL (CATHETERS) ×1 IMPLANT
CATH INFINITI JR4 5F (CATHETERS) ×1 IMPLANT
DEVICE RAD COMP TR BAND LRG (VASCULAR PRODUCTS) ×1 IMPLANT
GLIDESHEATH SLEND SS 6F .021 (SHEATH) ×1 IMPLANT
KIT HEART LEFT (KITS) ×2 IMPLANT
PACK CARDIAC CATHETERIZATION (CUSTOM PROCEDURE TRAY) ×2 IMPLANT
SYR MEDRAD MARK V 150ML (SYRINGE) ×2 IMPLANT
TRANSDUCER W/STOPCOCK (MISCELLANEOUS) ×2 IMPLANT
TUBING CIL FLEX 10 FLL-RA (TUBING) ×2 IMPLANT
WIRE SAFE-T 1.5MM-J .035X260CM (WIRE) ×1 IMPLANT

## 2015-11-25 NOTE — Discharge Instructions (Signed)
Radial Site Care °Refer to this sheet in the next few weeks. These instructions provide you with information about caring for yourself after your procedure. Your health care provider may also give you more specific instructions. Your treatment has been planned according to current medical practices, but problems sometimes occur. Call your health care provider if you have any problems or questions after your procedure. °WHAT TO EXPECT AFTER THE PROCEDURE °After your procedure, it is typical to have the following: °· Bruising at the radial site that usually fades within 1-2 weeks. °· Blood collecting in the tissue (hematoma) that may be painful to the touch. It should usually decrease in size and tenderness within 1-2 weeks. °HOME CARE INSTRUCTIONS °· Take medicines only as directed by your health care provider. °· You may shower 24-48 hours after the procedure or as directed by your health care provider. Remove the bandage (dressing) and gently wash the site with plain soap and water. Pat the area dry with a clean towel. Do not rub the site, because this may cause bleeding. °· Do not take baths, swim, or use a hot tub until your health care provider approves. °· Check your insertion site every day for redness, swelling, or drainage. °· Do not apply powder or lotion to the site. °· Do not flex or bend the affected arm for 24 hours or as directed by your health care provider. °· Do not push or pull heavy objects with the affected arm for 24 hours or as directed by your health care provider. °· Do not lift over 10 lb (4.5 kg) for 5 days after your procedure or as directed by your health care provider. °· Ask your health care provider when it is okay to: °¨ Return to work or school. °¨ Resume usual physical activities or sports. °¨ Resume sexual activity. °· Do not drive home if you are discharged the same day as the procedure. Have someone else drive you. °· You may drive 24 hours after the procedure unless otherwise  instructed by your health care provider. °· Do not operate machinery or power tools for 24 hours after the procedure. °· If your procedure was done as an outpatient procedure, which means that you went home the same day as your procedure, a responsible adult should be with you for the first 24 hours after you arrive home. °· Keep all follow-up visits as directed by your health care provider. This is important. °SEEK MEDICAL CARE IF: °· You have a fever. °· You have chills. °· You have increased bleeding from the radial site. Hold pressure on the site. °SEEK IMMEDIATE MEDICAL CARE IF: °· You have unusual pain at the radial site. °· You have redness, warmth, or swelling at the radial site. °· You have drainage (other than a small amount of blood on the dressing) from the radial site. °· The radial site is bleeding, and the bleeding does not stop after 30 minutes of holding steady pressure on the site. °· Your arm or hand becomes pale, cool, tingly, or numb. °  °This information is not intended to replace advice given to you by your health care provider. Make sure you discuss any questions you have with your health care provider. °  °Document Released: 04/25/2010 Document Revised: 04/13/2014 Document Reviewed: 10/09/2013 °Elsevier Interactive Patient Education ©2016 [employer] ° °

## 2015-11-25 NOTE — Interval H&P Note (Signed)
History and Physical Interval Note:  11/25/2015 7:28 AM  Xavier Goodwin  has presented today for surgery, with the diagnosis of abnormal myoview  The various methods of treatment have been discussed with the patient and family. After consideration of risks, benefits and other options for treatment, the patient has consented to  Procedure(s): Left Heart Cath and Coronary Angiography (N/A) as a surgical intervention .  The patient's history has been reviewed, patient examined, no change in status, stable for surgery.  I have reviewed the patient's chart and labs.  Questions were answered to the patient's satisfaction.     Jenkins Rouge

## 2015-11-25 NOTE — H&P (View-Only) (Signed)
Patient ID: Xavier Goodwin, male   DOB: 1961/04/01, 55 y.o.   MRN: [id number]   55 y.o.  referred by [name] for chest pain in 2015    Present for 2-3 weeks.  Sharp pressing pain radiates to left shoulder Occasionally accompanied by dyspnea Last only minutes.  Not always exertional No pleuritic or positional component No GI overtones.  CRF;s family history and elevated lipids on ASA and statin   Smoker has used e cigs to try to Quit but gave him headache.  Has not had CXR in a long time   Reviewed labs from 02/07/13  Normal LFTls  LDL 129  TC 191 HDL 45  Recent ones with Francesca Oman  F/U stress myovue  07/26/2013 reviewed and normal with EF 51%    Son lives in [place]  First grand baby 2016   Calcium Score done 10/16/15 reviewed: 266 which was 81st percentile for age and sex  Drinks lots of sweet tea Discussed non sugar alternatives like crystal light    ROS: Denies fever, malais, weight loss, blurry vision, decreased visual acuity, cough, sputum, SOB, hemoptysis, pleuritic pain, palpitaitons, heartburn, abdominal pain, melena, lower extremity edema, claudication, or rash.  All other systems reviewed and negative   General: Affect appropriate Healthy:  appears stated age 23: normal Neck supple with no adenopathy JVP normal no bruits no thyromegaly Lungs clear with no wheezing and good diaphragmatic motion Heart:  S1/S2 no murmur,rub, gallop or click PMI normal Abdomen: benighn, BS positve, no tenderness, no AAA no bruit.  No HSM or HJR Distal pulses intact with no bruits No edema Neuro non-focal Skin warm and dry No muscular weakness  Medications Current Outpatient Prescriptions  Medication Sig Dispense Refill  . aspirin 81 MG tablet Take 81 mg by mouth as needed for pain.    Marland Kitchen CRESTOR 40 MG tablet Take 1 tablet by mouth daily.    Marland Kitchen VIAGRA 100 MG tablet Take 25 mg by mouth daily as needed. For ED     No current facility-administered medications for this  visit.     Allergies Review of patient's allergies indicates no known allergies.  Family History: Family History  Problem Relation Age of Onset  . Hypertension Father   . Heart attack Father   . Diabetes Mother   . Cancer Mother   . Lupus Sister     Social History: Social History   Social History  . Marital status: Married    Spouse name: N/A  . Number of children: N/A  . Years of education: N/A   Occupational History  . Not on file.   Social History Main Topics  . Smoking status: Current Every Day Smoker  . Smokeless tobacco: Not on file  . Alcohol use Yes  . Drug use: No  . Sexual activity: Not on file   Other Topics Concern  . Not on file   Social History Narrative  . No narrative on file    Electrocardiogram:   07/26/13  SR rate 70 early repolarization   07/12/14  NSR rate 79 normal 10/10/15 SR rate 73 insig Q waves infer and lateral   Assessment and Plan Chol:  On statin labs with Dr Alroy Dust Chest Pain: Atypical normal stress myovue 07/25/13  Elevated calcium score on statin and ASA F/u stress myovue  Smoking:  counseled on cessation and risk of cancer and vascular disease  Jenkins Rouge

## 2015-11-25 NOTE — CV Procedure (Signed)
See full note in Epic cath section During this procedure the patient is administered a total of Versed 2 mg and Fentanyl 50 mg to achieve and maintain moderate conscious sedation.  The patient's heart rate, blood pressure, and oxygen saturation are monitored continuously during the procedure. The period of conscious sedation is 25 minutes, of which I was present face-to-face 100% of this time.

## 2015-11-25 NOTE — Research (Signed)
[facility] STUDY Informed Consent   Subject Name: Xavier Goodwin [facility]  Subject met inclusion and exclusion criteria.  The informed consent form, study requirements and expectations were reviewed with the subject and questions and concerns were addressed prior to the signing of the consent form.  The subject verbalized understanding of the trial requirements.  The subject agreed to participate in the [place] trial and signed the informed consent.  The informed consent was obtained prior to performance of any protocol-specific procedures for the subject.  A copy of the signed informed consent was given to the subject and a copy was placed in the subject's medical record.  Marlana Salvage 11/25/2015, 7:05 AM

## 2015-11-28 ENCOUNTER — Encounter: Payer: Self-pay | Admitting: *Deleted

## 2015-11-28 MED FILL — Nitroglycerin IV Soln 100 MCG/ML in D5W: INTRA_ARTERIAL | Qty: 10 | Status: AC

## 2016-04-20 DIAGNOSIS — N401 Enlarged prostate with lower urinary tract symptoms: Secondary | ICD-10-CM | POA: Diagnosis not present

## 2016-04-20 DIAGNOSIS — R350 Frequency of micturition: Secondary | ICD-10-CM | POA: Diagnosis not present

## 2016-04-20 DIAGNOSIS — R3915 Urgency of urination: Secondary | ICD-10-CM | POA: Diagnosis not present

## 2016-04-20 DIAGNOSIS — R972 Elevated prostate specific antigen [PSA]: Secondary | ICD-10-CM | POA: Diagnosis not present

## 2016-05-18 DIAGNOSIS — R972 Elevated prostate specific antigen [PSA]: Secondary | ICD-10-CM | POA: Diagnosis not present

## 2016-05-25 ENCOUNTER — Other Ambulatory Visit (HOSPITAL_COMMUNITY): Payer: Self-pay | Admitting: Urology

## 2016-05-25 DIAGNOSIS — C61 Malignant neoplasm of prostate: Secondary | ICD-10-CM | POA: Diagnosis not present

## 2016-05-27 DIAGNOSIS — C61 Malignant neoplasm of prostate: Secondary | ICD-10-CM | POA: Diagnosis not present

## 2016-06-02 ENCOUNTER — Other Ambulatory Visit: Payer: Self-pay | Admitting: Urology

## 2016-06-03 ENCOUNTER — Encounter (HOSPITAL_COMMUNITY)
Admission: RE | Admit: 2016-06-03 | Discharge: 2016-06-03 | Disposition: A | Discharge status: Home / Self Care | Payer: Commercial Managed Care - HMO | Source: Ambulatory Visit | Attending: Urology | Admitting: Urology

## 2016-06-03 DIAGNOSIS — C61 Malignant neoplasm of prostate: Secondary | ICD-10-CM

## 2016-06-03 MED ORDER — TECHNETIUM TC 99M MEDRONATE IV KIT
20.5000 | PACK | Freq: Once | INTRAVENOUS | Status: AC | PRN
  Administered 2016-06-03: 20.5 via INTRAVENOUS

## 2016-06-08 DIAGNOSIS — C61 Malignant neoplasm of prostate: Secondary | ICD-10-CM | POA: Diagnosis not present

## 2016-06-08 DIAGNOSIS — M6281 Muscle weakness (generalized): Secondary | ICD-10-CM | POA: Diagnosis not present

## 2016-06-22 DIAGNOSIS — R278 Other lack of coordination: Secondary | ICD-10-CM | POA: Diagnosis not present

## 2016-06-22 DIAGNOSIS — M6281 Muscle weakness (generalized): Secondary | ICD-10-CM | POA: Diagnosis not present

## 2016-06-22 DIAGNOSIS — C61 Malignant neoplasm of prostate: Secondary | ICD-10-CM | POA: Diagnosis not present

## 2016-06-23 DIAGNOSIS — C61 Malignant neoplasm of prostate: Secondary | ICD-10-CM | POA: Diagnosis not present

## 2016-06-26 NOTE — Progress Notes (Signed)
LOV cardio Dr Johnsie Cancel 10-29-15 epic Stress test 11-07-15 epic EKG 11-25-15 epic

## 2016-06-26 NOTE — Patient Instructions (Addendum)
Xavier Goodwin  06/26/2016   Your procedure is scheduled on: 07-02-16  Report to [facility] Main  Entrance take [facility]  elevators to 3rd floor to  [place] at 500AM.  Call this number if you have problems the morning of surgery [phone]   Remember: ONLY 1 PERSON MAY GO WITH YOU TO SHORT STAY TO GET  READY MORNING OF [place].  Do not eat food After Midnight Tuesday 06-30-16. Drink plenty of clear liquids all day Wednesday 07-01-16 and follow all of Dr. Lynne Logan bowel prep instructions. Nothing by mouth after midnight Wednesday!!      Take these medicines the morning of surgery with A SIP OF WATER: rosuvastatin(Crestor)                                 You may not have any metal on your body including hair pins and              piercings  Do not wear jewelry, make-up, lotions, powders or perfumes, deodorant             Do not wear nail polish.  Do not shave  48 hours prior to surgery.              Men may shave face and neck.   Do not bring valuables to the hospital. [place].  Contacts, dentures or bridgework may not be worn into surgery.  Leave suitcase in the car. After surgery it may be brought to your room.                Please read over the following fact sheets you were given: _____________________________________________________________________     CLEAR LIQUID DIET   Foods Allowed                                                                     Foods Excluded  Coffee and tea, regular and decaf                             liquids that you cannot  Plain Jell-O in any flavor                                             see through such as: Fruit ices (not with fruit pulp)                                     milk, soups, orange juice  Iced Popsicles                                    All solid food Carbonated beverages, regular and diet  Cranberry, grape and apple juices Sports drinks like Gatorade Lightly seasoned clear broth or consume(fat free) Sugar, honey syrup  Sample Menu Breakfast                                Lunch                                     Supper Cranberry juice                    Beef broth                            Chicken broth Jell-O                                     Grape juice                           Apple juice Coffee or tea                        Jell-O                                      Popsicle                                                Coffee or tea                        Coffee or tea  _____________________________________________________________________  [facility] - Preparing for Surgery Before surgery, you can play an important role.  Because skin is not sterile, your skin needs to be as free of germs as possible.  You can reduce the number of germs on your skin by washing with CHG (chlorahexidine gluconate) soap before surgery.  CHG is an antiseptic cleaner which kills germs and bonds with the skin to continue killing germs even after washing. Please DO NOT use if you have an allergy to CHG or antibacterial soaps.  If your skin becomes reddened/irritated stop using the CHG and inform your nurse when you arrive at Short Stay. Do not shave (including legs and underarms) for at least 48 hours prior to the first CHG shower.  You may shave your face/neck. Please follow these instructions carefully:  1.  Shower with CHG Soap the night before surgery and the  morning of Surgery.  2.  If you choose to wash your hair, wash your hair first as usual with your  normal  shampoo.  3.  After you shampoo, rinse your hair and body thoroughly to remove the  shampoo.                           4.  Use CHG as you would any other liquid soap.  You can apply chg directly  to the skin and wash  Gently with a scrungie or clean washcloth.  5.  Apply the CHG Soap to your body ONLY  FROM THE NECK DOWN.   Do not use on face/ open                           Wound or open sores. Avoid contact with eyes, ears mouth and genitals (private parts).                       Wash face,  Genitals (private parts) with your normal soap.             6.  Wash thoroughly, paying special attention to the area where your surgery  will be performed.  7.  Thoroughly rinse your body with warm water from the neck down.  8.  DO NOT shower/wash with your normal soap after using and rinsing off  the CHG Soap.                9.  Pat yourself dry with a clean towel.            10.  Wear clean pajamas.            11.  Place clean sheets on your bed the night of your first shower and do not  sleep with pets. Day of Surgery : Do not apply any lotions/deodorants the morning of surgery.  Please wear clean clothes to the hospital/surgery center.  FAILURE TO FOLLOW THESE INSTRUCTIONS MAY RESULT IN THE CANCELLATION OF YOUR SURGERY PATIENT SIGNATURE_________________________________  NURSE SIGNATURE__________________________________  ________________________________________________________________________   Xavier Goodwin  An incentive spirometer is a tool that can help keep your lungs clear and active. This tool measures how well you are filling your lungs with each breath. Taking long deep breaths may help reverse or decrease the chance of developing breathing (pulmonary) problems (especially infection) following:  A long period of time when you are unable to move or be active. BEFORE THE PROCEDURE   If the spirometer includes an indicator to show your best effort, your nurse or respiratory therapist will set it to a desired goal.  If possible, sit up straight or lean slightly forward. Try not to slouch.  Hold the incentive spirometer in an upright position. INSTRUCTIONS FOR USE  1. Sit on the edge of your bed if possible, or sit up as far as you can in bed or on a chair. 2. Hold the incentive  spirometer in an upright position. 3. Breathe out normally. 4. Place the mouthpiece in your mouth and seal your lips tightly around it. 5. Breathe in slowly and as deeply as possible, raising the piston or the ball toward the top of the column. 6. Hold your breath for 3-5 seconds or for as long as possible. Allow the piston or ball to fall to the bottom of the column. 7. Remove the mouthpiece from your mouth and breathe out normally. 8. Rest for a few seconds and repeat Steps 1 through 7 at least 10 times every 1-2 hours when you are awake. Take your time and take a few normal breaths between deep breaths. 9. The spirometer may include an indicator to show your best effort. Use the indicator as a goal to work toward during each repetition. 10. After each set of 10 deep breaths, practice coughing to be sure your lungs are clear. If you have an incision (the cut made at the time of  surgery), support your incision when coughing by placing a pillow or rolled up towels firmly against it. Once you are able to get out of bed, walk around indoors and cough well. You may stop using the incentive spirometer when instructed by your caregiver.  RISKS AND COMPLICATIONS  Take your time so you do not get dizzy or light-headed.  If you are in pain, you may need to take or ask for pain medication before doing incentive spirometry. It is harder to take a deep breath if you are having pain. AFTER USE  Rest and breathe slowly and easily.  It can be helpful to keep track of a log of your progress. Your caregiver can provide you with a simple table to help with this. If you are using the spirometer at home, follow these instructions: [place] IF:   You are having difficultly using the spirometer.  You have trouble using the spirometer as often as instructed.  Your pain medication is not giving enough relief while using the spirometer.  You develop fever of 100.5 F (38.1 C) or higher. SEEK  IMMEDIATE MEDICAL CARE IF:   You cough up bloody sputum that had not been present before.  You develop fever of 102 F (38.9 C) or greater.  You develop worsening pain at or near the incision site. MAKE SURE YOU:   Understand these instructions.  Will watch your condition.  Will get help right away if you are not doing well or get worse. Document Released: 08/03/2006 Document Revised: 06/15/2011 Document Reviewed: 10/04/2006 ExitCare Patient Information 2014 ExitCare, [place].   ________________________________________________________________________  WHAT IS A BLOOD TRANSFUSION? Blood Transfusion Information  A transfusion is the replacement of blood or some of its parts. Blood is made up of multiple cells which provide different functions.  Red blood cells carry oxygen and are used for blood loss replacement.  White blood cells fight against infection.  Platelets control bleeding.  Plasma helps clot blood.  Other blood products are available for specialized needs, such as hemophilia or other clotting disorders. BEFORE THE TRANSFUSION  Who gives blood for transfusions?   Healthy volunteers who are fully evaluated to make sure their blood is safe. This is blood bank blood. Transfusion therapy is the safest it has ever been in the practice of medicine. Before blood is taken from a donor, a complete history is taken to make sure that person has no history of diseases nor engages in risky social behavior (examples are intravenous drug use or sexual activity with multiple partners). The donor's travel history is screened to minimize risk of transmitting infections, such as malaria. The donated blood is tested for signs of infectious diseases, such as HIV and hepatitis. The blood is then tested to be sure it is compatible with you in order to minimize the chance of a transfusion reaction. If you or a relative donates blood, this is often done in anticipation of surgery and is not  appropriate for emergency situations. It takes many days to process the donated blood. RISKS AND COMPLICATIONS Although transfusion therapy is very safe and saves many lives, the main dangers of transfusion include:   Getting an infectious disease.  Developing a transfusion reaction. This is an allergic reaction to something in the blood you were given. Every precaution is taken to prevent this. The decision to have a blood transfusion has been considered carefully by your caregiver before blood is given. Blood is not given unless the benefits outweigh the risks. AFTER THE  TRANSFUSION  Right after receiving a blood transfusion, you will usually feel much better and more energetic. This is especially true if your red blood cells have gotten low (anemic). The transfusion raises the level of the red blood cells which carry oxygen, and this usually causes an energy increase.  The nurse administering the transfusion will monitor you carefully for complications. HOME CARE INSTRUCTIONS  No special instructions are needed after a transfusion. You may find your energy is better. Speak with your caregiver about any limitations on activity for underlying diseases you may have. SEEK MEDICAL CARE IF:   Your condition is not improving after your transfusion.  You develop redness or irritation at the intravenous (IV) site. SEEK IMMEDIATE MEDICAL CARE IF:  Any of the following symptoms occur over the next 12 hours:  Shaking chills.  You have a temperature by mouth above 102 F (38.9 C), not controlled by medicine.  Chest, back, or muscle pain.  People around you feel you are not acting correctly or are confused.  Shortness of breath or difficulty breathing.  Dizziness and fainting.  You get a rash or develop hives.  You have a decrease in urine output.  Your urine turns a dark color or changes to pink, red, or brown. Any of the following symptoms occur over the next 10 days:  You have a  temperature by mouth above 102 F (38.9 C), not controlled by medicine.  Shortness of breath.  Weakness after normal activity.  The white part of the eye turns yellow (jaundice).  You have a decrease in the amount of urine or are urinating less often.  Your urine turns a dark color or changes to pink, red, or brown. Document Released: 03/20/2000 Document Revised: 06/15/2011 Document Reviewed: 11/07/2007 [facility] Patient Information 2014 [place], [place].  _______________________________________________________________________

## 2016-06-29 ENCOUNTER — Encounter (HOSPITAL_COMMUNITY): Payer: Self-pay

## 2016-06-29 ENCOUNTER — Ambulatory Visit (HOSPITAL_COMMUNITY)
Admission: RE | Admit: 2016-06-29 | Discharge: 2016-06-29 | Disposition: A | Discharge status: Home / Self Care | Payer: Commercial Managed Care - HMO | Source: Ambulatory Visit | Attending: Urology | Admitting: Urology

## 2016-06-29 ENCOUNTER — Encounter (HOSPITAL_COMMUNITY)
Admission: RE | Admit: 2016-06-29 | Discharge: 2016-06-29 | Disposition: A | Discharge status: Home / Self Care | Payer: Commercial Managed Care - HMO | Source: Ambulatory Visit | Attending: Urology | Admitting: Urology

## 2016-06-29 DIAGNOSIS — Z01818 Encounter for other preprocedural examination: Secondary | ICD-10-CM | POA: Insufficient documentation

## 2016-06-29 DIAGNOSIS — R9389 Abnormal findings on diagnostic imaging of other specified body structures: Secondary | ICD-10-CM

## 2016-06-29 DIAGNOSIS — R938 Abnormal findings on diagnostic imaging of other specified body structures: Secondary | ICD-10-CM | POA: Diagnosis present

## 2016-06-29 DIAGNOSIS — C61 Malignant neoplasm of prostate: Secondary | ICD-10-CM | POA: Insufficient documentation

## 2016-06-29 DIAGNOSIS — Z01812 Encounter for preprocedural laboratory examination: Secondary | ICD-10-CM | POA: Insufficient documentation

## 2016-06-29 HISTORY — DX: Personal history of urinary calculi: Z87.442

## 2016-06-29 LAB — BASIC METABOLIC PANEL
ANION GAP: 4 — AB (ref 5–15)
BUN: 9 mg/dL (ref 6–20)
CO2: 27 mmol/L (ref 22–32)
Calcium: 9.2 mg/dL (ref 8.9–10.3)
Chloride: 111 mmol/L (ref 101–111)
Creatinine, Ser: 0.9 mg/dL (ref 0.61–1.24)
GFR calc Af Amer: >60 mL/min (ref >60)
GLUCOSE: 106 mg/dL — AB (ref 65–99)
POTASSIUM: 3.7 mmol/L (ref 3.5–5.1)
SODIUM: 142 mmol/L (ref 135–145)

## 2016-06-29 LAB — CBC
HEMATOCRIT: 39.7 % (ref 39.0–52.0)
HEMOGLOBIN: 13.6 g/dL (ref 13.0–17.0)
MCH: 32.3 pg (ref 26.0–34.0)
MCHC: 34.3 g/dL (ref 30.0–36.0)
MCV: 94.3 fL (ref 78.0–100.0)
Platelets: 192 10*3/uL (ref 150–400)
RBC: 4.21 MIL/uL — ABNORMAL LOW (ref 4.22–5.81)
RDW: 13.3 % (ref 11.5–15.5)
WBC: 8.3 10*3/uL (ref 4.0–10.5)

## 2016-06-29 LAB — ABO/RH: ABO/RH(D): O POS

## 2016-07-01 NOTE — H&P (Signed)
Office Visit Report     06/23/2016   --------------------------------------------------------------------------------   Xavier Goodwin. Schaber  MRN: [id number]  PRIMARY CARE:  Donavan Burnet, MD  DOB: [birth year], 56 year old Male  REFERRING:  L [name], MD  SSN: [id number]  PROVIDER:  Rana Snare, M.D.    TREATING:  Raynelle Bring, M.D.    LOCATION:  [facility] [phone]   --------------------------------------------------------------------------------   CC/HPI: CC: Prostate Cancer   Physician requesting consult: Dr. Rana Snare  PCP: Dr. [name]   Xavier Goodwin is a 56 year old gentleman who was found to have an elevated PSA of 13.7 prompting a TRUS biopsy of the prostate on 05/18/16 that confirmed Gleason 4+3=7 adenocarcinoma with 3 out of 12 biopsy cores positive for malignancy.   Family history: None.   Imaging studies:  CT scan of the pelvis (07/03/16) - Non-pathologically enlarged left pelvic lymph nodes. No concerning findings for metastatic disease.  Bone scan (2016-07-03) - Negative for metastatic disease.   PMH: He has a history of hypercholesterolemia. He did develop atypical chest pain last summer and had an abnormal stress test. This prompted a cardiac catheterization that did not demonstrate any clinically significant stenoses. His calculated ejection fraction on his stress test was 48%.  PSH: No abdominal surgeries.   TNM stage: cT1c Nx Mx  PSA: 13.7  Gleason score: 4+3=7  Biopsy (05/18/16): 3/12 cores positive  Left: L mid (60%, 4+3=7), L lateral base (20%, 4+3=7, PNI), L base 90%, 4+3=7, PNI)  Right: Benign  Prostate volume: 42 cc   Nomogram  OC disease: 30%  EPE: 65%  SVI: 8%  LNI: 9%  PFS (5 year, 10 year): 54%,39%   Urinary function: IPSS is 9. He was recently started on alpha-blocker therapy and has found this to be quite helpful with his voiding symptoms.  Erectile function: SHIM score is 23.     ALLERGIES: No Known  Allergies    MEDICATIONS: Aspirin 81 mg tablet, chewable  Rapaflo 4 mg capsule 1 capsule PO Daily  Tamsulosin Hcl 0.4 mg capsule, ext release 24 hr 1 capsule PO Q HS  Rosuvastatin Calcium 20 mg tablet     GU PSH: ESWL, [place] - 2006 Locm 300-399Mg /Ml Iodine,1Ml - July 03, 2016 Prostate Needle Biopsy - 05/18/2016    NON-GU PSH: Colonoscopy - 2016 Elbow Surgery (Unspecified), Left - 1976 Surgical Pathology, Gross And Microscopic Examination For Prostate Needle - 05/18/2016    GU PMH: Prostate Cancer - 05/25/2016 BPH w/LUTS - 04/20/2016 Elevated PSA - 04/20/2016 Urinary Frequency - 04/20/2016 Urinary Urgency - 04/20/2016 Kidney Stone      PMH Notes: Sickle Cell Trait   NON-GU PMH: Muscle weakness (generalized) - 06/08/2016 Hypercholesterolemia    FAMILY HISTORY: Atrial Fibrillation - Mother Congestive Heart Failure - Mother Diabetes - Mother father deceased at age 58 - Father Heart Attack - Mother, Father Hypertension - Father, Mother kidney disease - Mother Lung Cancer - Sister lupus - Sister sickle cell trait - Runs in Family   SOCIAL HISTORY: Marital Status: Married Current Smoking Status: Patient smokes. Has smoked since 04/06/1986. Smokes 1 pack per day.   Tobacco Use Assessment Completed: Used Tobacco in last 30 days? Has never drank.  Does not drink caffeine. Patient's occupation is/was Retired- [employer].    REVIEW OF SYSTEMS:    GU Review Male:   Patient denies frequent urination, hard to postpone urination, burning/ pain with urination, get up at night to urinate, leakage of urine,  stream starts and stops, trouble starting your streams, and have to strain to urinate .  Gastrointestinal (Upper):   Patient denies nausea and vomiting.  Gastrointestinal (Lower):   Patient denies diarrhea and constipation.  Constitutional:   Patient denies fever, night sweats, weight loss, and fatigue.  Skin:   Patient denies itching and skin rash/ lesion.  Eyes:   Patient denies  blurred vision and double vision.  Ears/ Nose/ Throat:   Patient denies sore throat and sinus problems.  Hematologic/Lymphatic:   Patient denies swollen glands and easy bruising.  Cardiovascular:   Patient denies leg swelling and chest pains.  Respiratory:   Patient denies cough and shortness of breath.  Endocrine:   Patient denies excessive thirst.  Musculoskeletal:   Patient denies back pain and joint pain.  Neurological:   Patient denies headaches and dizziness.  Psychologic:   Patient denies depression and anxiety.   VITAL SIGNS:      06/23/2016 08:36 AM  Weight 235 lb / 106.59 kg  Height 73 in / 185.42 cm  BP 114/78 mmHg  Pulse 63 /min  BMI 31.0 kg/m   GU PHYSICAL EXAMINATION:    Prostate: His exam is difficult due to his body habitus. However, no nodularity or induration is appreciated for the apical aspect of the prostate.   MULTI-SYSTEM PHYSICAL EXAMINATION:    Constitutional: Well-nourished. No physical deformities. Normally developed. Good grooming.  Neck: Neck symmetrical, not swollen. Normal tracheal position.  Respiratory: No labored breathing, no use of accessory muscles. Clear bilaterally.  Cardiovascular: Normal temperature, normal extremity pulses, no swelling, no varicosities. Regular rate and rhythm.  Lymphatic: No enlargement of neck, axillae, groin.  Skin: No paleness, no jaundice, no cyanosis. No lesion, no ulcer, no rash.  Neurologic / Psychiatric: Oriented to time, oriented to place, oriented to person. No depression, no anxiety, no agitation.  Gastrointestinal: No mass, no tenderness, no rigidity, non obese abdomen.  Eyes: Normal conjunctivae. Normal eyelids.  Ears, Nose, Mouth, and Throat: Left ear no scars, no lesions, no masses. Right ear no scars, no lesions, no masses. Nose no scars, no lesions, no masses. Normal hearing. Normal lips.  Musculoskeletal: Normal gait and station of head and neck.     PAST DATA REVIEWED:  Source Of History:  Patient   Lab Test Review:   PSA  Records Review:   Pathology Reports  Urine Test Review:   Urinalysis  X-Ray Review: C.T. Pelvis: Reviewed Films.  Bone Scan: Reviewed Films.     04/20/16  PSA  Total PSA 13.70 ng/dl    PROCEDURES:          Urinalysis Dipstick Dipstick Cont'd  Color: Yellow Bilirubin: Neg  Appearance: Clear Ketones: Neg  Specific Gravity: 1.025 Blood: Neg  pH: 6.0 Protein: Neg  Glucose: Neg Urobilinogen: 0.2    Nitrites: Neg    Leukocyte Esterase: Neg    ASSESSMENT:      ICD-10 Details  1 GU:   Prostate Cancer - C61    PLAN:           Schedule Return Visit/Planned Activity: Keep Scheduled Appointment          Document Letter(s):  Created for Patient: Clinical Summary         Notes:   1. Prostate cancer: I had a detailed discussion with Mr. Coor and his wife today regarding his prostate cancer diagnosis in the context of his life expectancy and disease parameters. He has made the decision to proceed with surgical therapy  and is seen today in preparation for that treatment next week.   The patient was counseled about the natural history of prostate cancer and the standard treatment options that are available for prostate cancer. It was explained to him how his age and life expectancy, clinical stage, Gleason score, and PSA affect his prognosis, the decision to proceed with additional staging studies, as well as how that information influences recommended treatment strategies. We discussed the roles for active surveillance, radiation therapy, surgical therapy, androgen deprivation, as well as ablative therapy options for the treatment of prostate cancer as appropriate to his individual cancer situation. We discussed the risks and benefits of these options with regard to their impact on cancer control and also in terms of potential adverse events, complications, and impact on quality of life particularly related to urinary and sexual function. The patient was encouraged  to ask questions throughout the discussion today and all questions were answered to his stated satisfaction. In addition, the patient was provided with and/or directed to appropriate resources and literature for further education about prostate cancer and treatment options.   We discussed surgical therapy for prostate cancer including the different available surgical approaches. We discussed, in detail, the risks and expectations of surgery with regard to cancer control, urinary control, and erectile function as well as the expected postoperative recovery process. Additional risks of surgery including but not limited to bleeding, infection, hernia formation, nerve damage, lymphocele formation, bowel/rectal injury potentially necessitating colostomy, damage to the urinary tract resulting in urine leakage, urethral stricture, and the cardiopulmonary risks such as myocardial infarction, stroke, death, venothromboembolism, etc. were explained. The risk of open surgical conversion for robotic/laparoscopic prostatectomy was also discussed.   He is scheduled to proceed with a bilateral nerve sparing robot-assisted laparoscopic radical prostatectomy and bilateral pelvic lymphadenectomy next week.   Cc: Dr. [name]  Dr. Rana Snare          E & M CODE: I spent at least 60 minutes face to face with the patient, more than 50% of that time was spent on counseling and/or coordinating care.     * Signed by Raynelle Bring, M.D. on 06/23/16 at 11:19 AM (EDT)*

## 2016-07-02 ENCOUNTER — Ambulatory Visit (HOSPITAL_COMMUNITY): Payer: Commercial Managed Care - HMO | Admitting: Anesthesiology

## 2016-07-02 ENCOUNTER — Observation Stay (HOSPITAL_COMMUNITY)
Admission: RE | Admit: 2016-07-02 | Discharge: 2016-07-03 | Disposition: A | Discharge status: Home / Self Care | Payer: Commercial Managed Care - HMO | Source: Ambulatory Visit | Attending: Urology | Admitting: Urology

## 2016-07-02 ENCOUNTER — Encounter (HOSPITAL_COMMUNITY): Payer: Self-pay | Admitting: *Deleted

## 2016-07-02 ENCOUNTER — Encounter (HOSPITAL_COMMUNITY)
Admission: RE | Disposition: A | Discharge status: Home / Self Care | Payer: Self-pay | Source: Ambulatory Visit | Attending: Urology

## 2016-07-02 DIAGNOSIS — C61 Malignant neoplasm of prostate: Secondary | ICD-10-CM | POA: Diagnosis not present

## 2016-07-02 DIAGNOSIS — Z7982 Long term (current) use of aspirin: Secondary | ICD-10-CM | POA: Insufficient documentation

## 2016-07-02 DIAGNOSIS — E7800 Pure hypercholesterolemia, unspecified: Secondary | ICD-10-CM | POA: Insufficient documentation

## 2016-07-02 DIAGNOSIS — F17210 Nicotine dependence, cigarettes, uncomplicated: Secondary | ICD-10-CM | POA: Insufficient documentation

## 2016-07-02 DIAGNOSIS — Z79899 Other long term (current) drug therapy: Secondary | ICD-10-CM | POA: Insufficient documentation

## 2016-07-02 DIAGNOSIS — R079 Chest pain, unspecified: Secondary | ICD-10-CM | POA: Diagnosis not present

## 2016-07-02 HISTORY — PX: ROBOT ASSISTED LAPAROSCOPIC RADICAL PROSTATECTOMY: SHX5141

## 2016-07-02 HISTORY — PX: PELVIC LYMPH NODE DISSECTION: SHX6543

## 2016-07-02 LAB — TYPE AND SCREEN
ABO/RH(D): O POS
Antibody Screen: NEGATIVE

## 2016-07-02 LAB — HEMOGLOBIN AND HEMATOCRIT, BLOOD
HEMATOCRIT: 39.5 % (ref 39.0–52.0)
Hemoglobin: 13.9 g/dL (ref 13.0–17.0)

## 2016-07-02 SURGERY — XI ROBOTIC ASSISTED LAPAROSCOPIC RADICAL PROSTATECTOMY LEVEL 2
Anesthesia: General

## 2016-07-02 MED ORDER — LIDOCAINE 2% (20 MG/ML) 5 ML SYRINGE
INTRAMUSCULAR | Status: AC
  Filled 2016-07-02: qty 5

## 2016-07-02 MED ORDER — KETOROLAC TROMETHAMINE 30 MG/ML IJ SOLN
INTRAMUSCULAR | Status: DC | PRN
  Administered 2016-07-02: 30 mg via INTRAVENOUS

## 2016-07-02 MED ORDER — MEPERIDINE HCL 50 MG/ML IJ SOLN: 6.2500 mg | INTRAMUSCULAR | Status: DC | PRN

## 2016-07-02 MED ORDER — CEFAZOLIN SODIUM-DEXTROSE 2-4 GM/100ML-% IV SOLN
INTRAVENOUS | Status: AC
  Filled 2016-07-02: qty 100

## 2016-07-02 MED ORDER — ROCURONIUM BROMIDE 50 MG/5ML IV SOSY
PREFILLED_SYRINGE | INTRAVENOUS | Status: AC
  Filled 2016-07-02: qty 5

## 2016-07-02 MED ORDER — DEXAMETHASONE SODIUM PHOSPHATE 10 MG/ML IJ SOLN
INTRAMUSCULAR | Status: AC
  Filled 2016-07-02: qty 1

## 2016-07-02 MED ORDER — ONDANSETRON HCL 4 MG/2ML IJ SOLN
INTRAMUSCULAR | Status: DC | PRN
  Administered 2016-07-02: 4 mg via INTRAVENOUS

## 2016-07-02 MED ORDER — ROCURONIUM BROMIDE 50 MG/5ML IV SOSY
PREFILLED_SYRINGE | INTRAVENOUS | Status: DC | PRN
  Administered 2016-07-02: 10 mg via INTRAVENOUS
  Administered 2016-07-02: 5 mg via INTRAVENOUS
  Administered 2016-07-02 (×2): 10 mg via INTRAVENOUS
  Administered 2016-07-02: 45 mg via INTRAVENOUS
  Administered 2016-07-02: 10 mg via INTRAVENOUS

## 2016-07-02 MED ORDER — FENTANYL CITRATE (PF) 100 MCG/2ML IJ SOLN
25.0000 ug | INTRAMUSCULAR | Status: DC | PRN
  Administered 2016-07-02 (×3): 50 ug via INTRAVENOUS

## 2016-07-02 MED ORDER — ACETAMINOPHEN 325 MG PO TABS: 650.0000 mg | ORAL_TABLET | ORAL | Status: DC | PRN

## 2016-07-02 MED ORDER — HYDROCODONE-ACETAMINOPHEN 5-325 MG PO TABS: 1.0000 | ORAL_TABLET | Freq: Four times a day (QID) | ORAL | 0 refills | Status: DC | PRN

## 2016-07-02 MED ORDER — LACTATED RINGERS IV SOLN
INTRAVENOUS | Status: DC | PRN
  Administered 2016-07-02 (×2): via INTRAVENOUS

## 2016-07-02 MED ORDER — KCL IN DEXTROSE-NACL 20-5-0.45 MEQ/L-%-% IV SOLN
INTRAVENOUS | Status: DC
  Administered 2016-07-02 – 2016-07-03 (×3): via INTRAVENOUS
  Filled 2016-07-02 (×3): qty 1000

## 2016-07-02 MED ORDER — CEFAZOLIN SODIUM-DEXTROSE 2-4 GM/100ML-% IV SOLN
2.0000 g | INTRAVENOUS | Status: AC
  Administered 2016-07-02: 2 g via INTRAVENOUS

## 2016-07-02 MED ORDER — BELLADONNA ALKALOIDS-OPIUM 16.2-60 MG RE SUPP
1.0000 | Freq: Once | RECTAL | Status: AC
  Administered 2016-07-02: 1 via RECTAL
  Filled 2016-07-02: qty 1

## 2016-07-02 MED ORDER — SODIUM CHLORIDE 0.9 % IR SOLN
Status: DC | PRN
  Administered 2016-07-02: 1000 mL

## 2016-07-02 MED ORDER — KETOROLAC TROMETHAMINE 30 MG/ML IJ SOLN
INTRAMUSCULAR | Status: AC
  Filled 2016-07-02: qty 1

## 2016-07-02 MED ORDER — BUPIVACAINE-EPINEPHRINE 0.25% -1:200000 IJ SOLN
INTRAMUSCULAR | Status: DC | PRN
  Administered 2016-07-02: 25 mL

## 2016-07-02 MED ORDER — SULFAMETHOXAZOLE-TRIMETHOPRIM 800-160 MG PO TABS: 1.0000 | ORAL_TABLET | Freq: Two times a day (BID) | ORAL | 0 refills | Status: DC

## 2016-07-02 MED ORDER — HYDROMORPHONE HCL 1 MG/ML IJ SOLN
INTRAMUSCULAR | Status: DC | PRN
  Administered 2016-07-02 (×5): .2 mg via INTRAVENOUS

## 2016-07-02 MED ORDER — DOCUSATE SODIUM 100 MG PO CAPS
100.0000 mg | ORAL_CAPSULE | Freq: Two times a day (BID) | ORAL | Status: DC
  Administered 2016-07-02 – 2016-07-03 (×2): 100 mg via ORAL
  Filled 2016-07-02 (×2): qty 1

## 2016-07-02 MED ORDER — PROPOFOL 10 MG/ML IV BOLUS
INTRAVENOUS | Status: AC
  Filled 2016-07-02: qty 20

## 2016-07-02 MED ORDER — DIPHENHYDRAMINE HCL 12.5 MG/5ML PO ELIX: 12.5000 mg | ORAL_SOLUTION | Freq: Four times a day (QID) | ORAL | Status: DC | PRN

## 2016-07-02 MED ORDER — FENTANYL CITRATE (PF) 250 MCG/5ML IJ SOLN
INTRAMUSCULAR | Status: DC | PRN
  Administered 2016-07-02: 100 ug via INTRAVENOUS
  Administered 2016-07-02 (×3): 50 ug via INTRAVENOUS

## 2016-07-02 MED ORDER — ROSUVASTATIN CALCIUM 20 MG PO TABS
20.0000 mg | ORAL_TABLET | Freq: Every day | ORAL | Status: DC
  Administered 2016-07-03: 20 mg via ORAL
  Filled 2016-07-02: qty 1

## 2016-07-02 MED ORDER — HEPARIN SODIUM (PORCINE) 1000 UNIT/ML IJ SOLN
INTRAMUSCULAR | Status: AC
  Filled 2016-07-02: qty 1

## 2016-07-02 MED ORDER — MIDAZOLAM HCL 2 MG/2ML IJ SOLN
INTRAMUSCULAR | Status: DC | PRN
  Administered 2016-07-02: 2 mg via INTRAVENOUS

## 2016-07-02 MED ORDER — DIPHENHYDRAMINE HCL 50 MG/ML IJ SOLN: 12.5000 mg | Freq: Four times a day (QID) | INTRAMUSCULAR | Status: DC | PRN

## 2016-07-02 MED ORDER — ONDANSETRON HCL 4 MG/2ML IJ SOLN
INTRAMUSCULAR | Status: AC
  Filled 2016-07-02: qty 2

## 2016-07-02 MED ORDER — SUGAMMADEX SODIUM 500 MG/5ML IV SOLN
INTRAVENOUS | Status: DC | PRN
  Administered 2016-07-02: 250 mg via INTRAVENOUS

## 2016-07-02 MED ORDER — METOCLOPRAMIDE HCL 5 MG/ML IJ SOLN
INTRAMUSCULAR | Status: AC
  Filled 2016-07-02: qty 2

## 2016-07-02 MED ORDER — METOCLOPRAMIDE HCL 5 MG/ML IJ SOLN: 10.0000 mg | Freq: Once | INTRAMUSCULAR | Status: DC | PRN

## 2016-07-02 MED ORDER — DEXAMETHASONE SODIUM PHOSPHATE 10 MG/ML IJ SOLN
INTRAMUSCULAR | Status: DC | PRN
  Administered 2016-07-02: 10 mg via INTRAVENOUS

## 2016-07-02 MED ORDER — SUGAMMADEX SODIUM 200 MG/2ML IV SOLN
INTRAVENOUS | Status: AC
  Filled 2016-07-02: qty 2

## 2016-07-02 MED ORDER — KETOROLAC TROMETHAMINE 15 MG/ML IJ SOLN
15.0000 mg | Freq: Four times a day (QID) | INTRAMUSCULAR | Status: DC
  Administered 2016-07-02 – 2016-07-03 (×4): 15 mg via INTRAVENOUS
  Filled 2016-07-02 (×4): qty 1

## 2016-07-02 MED ORDER — MIDAZOLAM HCL 2 MG/2ML IJ SOLN
INTRAMUSCULAR | Status: AC
  Filled 2016-07-02: qty 2

## 2016-07-02 MED ORDER — SODIUM CHLORIDE 0.9 % IJ SOLN
INTRAMUSCULAR | Status: AC
  Filled 2016-07-02: qty 10

## 2016-07-02 MED ORDER — LIDOCAINE 2% (20 MG/ML) 5 ML SYRINGE
INTRAMUSCULAR | Status: DC | PRN
  Administered 2016-07-02: 100 mg via INTRAVENOUS

## 2016-07-02 MED ORDER — LACTATED RINGERS IV SOLN
INTRAVENOUS | Status: DC | PRN
  Administered 2016-07-02: 1 mL

## 2016-07-02 MED ORDER — SUGAMMADEX SODIUM 500 MG/5ML IV SOLN
INTRAVENOUS | Status: AC
  Filled 2016-07-02: qty 5

## 2016-07-02 MED ORDER — MORPHINE SULFATE (PF) 4 MG/ML IV SOLN
2.0000 mg | INTRAVENOUS | Status: DC | PRN
Start: 2016-07-02 — End: 2016-07-03
  Administered 2016-07-02 (×3): 2 mg via INTRAVENOUS
  Administered 2016-07-03: 4 mg via INTRAVENOUS
  Filled 2016-07-02 (×4): qty 1

## 2016-07-02 MED ORDER — SUCCINYLCHOLINE CHLORIDE 200 MG/10ML IV SOSY
PREFILLED_SYRINGE | INTRAVENOUS | Status: AC
  Filled 2016-07-02: qty 10

## 2016-07-02 MED ORDER — FENTANYL CITRATE (PF) 100 MCG/2ML IJ SOLN
INTRAMUSCULAR | Status: AC
  Filled 2016-07-02: qty 2

## 2016-07-02 MED ORDER — LACTATED RINGERS IV SOLN: INTRAVENOUS | Status: DC

## 2016-07-02 MED ORDER — SODIUM CHLORIDE 0.9 % IV BOLUS (SEPSIS)
1000.0000 mL | Freq: Once | INTRAVENOUS | Status: AC
  Administered 2016-07-02: 1000 mL via INTRAVENOUS

## 2016-07-02 MED ORDER — CEFAZOLIN IN D5W 1 GM/50ML IV SOLN
1.0000 g | Freq: Three times a day (TID) | INTRAVENOUS | Status: AC
  Administered 2016-07-02 (×2): 1 g via INTRAVENOUS
  Filled 2016-07-02 (×2): qty 50

## 2016-07-02 MED ORDER — HYDROMORPHONE HCL 2 MG/ML IJ SOLN
INTRAMUSCULAR | Status: AC
  Filled 2016-07-02: qty 1

## 2016-07-02 MED ORDER — FENTANYL CITRATE (PF) 250 MCG/5ML IJ SOLN
INTRAMUSCULAR | Status: AC
  Filled 2016-07-02: qty 5

## 2016-07-02 MED ORDER — BUPIVACAINE HCL (PF) 0.25 % IJ SOLN
INTRAMUSCULAR | Status: AC
  Filled 2016-07-02: qty 30

## 2016-07-02 MED ORDER — PROPOFOL 10 MG/ML IV BOLUS
INTRAVENOUS | Status: DC | PRN
  Administered 2016-07-02: 200 mg via INTRAVENOUS

## 2016-07-02 SURGICAL SUPPLY — 52 items
ADH SKN CLS APL DERMABOND .7 (GAUZE/BANDAGES/DRESSINGS) ×2
APPLICATOR COTTON TIP 6IN STRL (MISCELLANEOUS) ×4 IMPLANT
CATH FOLEY 2WAY SLVR 18FR 30CC (CATHETERS) ×4 IMPLANT
CATH ROBINSON RED A/P 16FR (CATHETERS) ×4 IMPLANT
CATH ROBINSON RED A/P 8FR (CATHETERS) ×4 IMPLANT
CATH TIEMANN FOLEY 18FR 5CC (CATHETERS) ×4 IMPLANT
CHLORAPREP W/TINT 26ML (MISCELLANEOUS) ×4 IMPLANT
CLIP LIGATING HEM O LOK PURPLE (MISCELLANEOUS) ×8 IMPLANT
COVER SURGICAL LIGHT HANDLE (MISCELLANEOUS) ×4 IMPLANT
COVER TIP SHEARS 8 DVNC (MISCELLANEOUS) ×2 IMPLANT
COVER TIP SHEARS 8MM DA VINCI (MISCELLANEOUS) ×2
CUTTER ECHEON FLEX ENDO 45 340 (ENDOMECHANICALS) ×4 IMPLANT
DECANTER SPIKE VIAL GLASS SM (MISCELLANEOUS) ×4 IMPLANT
DERMABOND ADVANCED (GAUZE/BANDAGES/DRESSINGS) ×2
DERMABOND ADVANCED .7 DNX12 (GAUZE/BANDAGES/DRESSINGS) IMPLANT
DRAPE ARM DVNC X/XI (DISPOSABLE) ×8 IMPLANT
DRAPE COLUMN DVNC XI (DISPOSABLE) ×2 IMPLANT
DRAPE DA VINCI XI ARM (DISPOSABLE) ×8
DRAPE DA VINCI XI COLUMN (DISPOSABLE) ×2
DRAPE SURG IRRIG POUCH 19X23 (DRAPES) ×4 IMPLANT
DRSG TEGADERM 4X4.75 (GAUZE/BANDAGES/DRESSINGS) ×4 IMPLANT
ELECT REM PT RETURN 15FT ADLT (MISCELLANEOUS) ×4 IMPLANT
GLOVE BIO SURGEON STRL SZ 6.5 (GLOVE) ×3 IMPLANT
GLOVE BIO SURGEONS STRL SZ 6.5 (GLOVE) ×1
GLOVE BIOGEL M STRL SZ7.5 (GLOVE) ×8 IMPLANT
GOWN STRL REUS W/TWL LRG LVL3 (GOWN DISPOSABLE) ×12 IMPLANT
HOLDER FOLEY CATH W/STRAP (MISCELLANEOUS) ×4 IMPLANT
IRRIG SUCT STRYKERFLOW 2 WTIP (MISCELLANEOUS) ×4
IRRIGATION SUCT STRKRFLW 2 WTP (MISCELLANEOUS) ×2 IMPLANT
IV LACTATED RINGERS 1000ML (IV SOLUTION) ×4 IMPLANT
NDL SAFETY ECLIPSE 18X1.5 (NEEDLE) ×2 IMPLANT
NEEDLE HYPO 18GX1.5 SHARP (NEEDLE) ×4
PACK ROBOT UROLOGY CUSTOM (CUSTOM PROCEDURE TRAY) ×4 IMPLANT
RELOAD GREEN ECHELON 45 (STAPLE) ×4 IMPLANT
SEAL CANN UNIV 5-8 DVNC XI (MISCELLANEOUS) ×8 IMPLANT
SEAL XI 5MM-8MM UNIVERSAL (MISCELLANEOUS) ×8
SOLUTION ELECTROLUBE (MISCELLANEOUS) ×4 IMPLANT
SUT ETHILON 3 0 PS 1 (SUTURE) ×4 IMPLANT
SUT MNCRL 3 0 RB1 (SUTURE) ×2 IMPLANT
SUT MNCRL 3 0 VIOLET RB1 (SUTURE) ×2 IMPLANT
SUT MNCRL AB 4-0 PS2 18 (SUTURE) ×10 IMPLANT
SUT MONOCRYL 3 0 RB1 (SUTURE) ×4
SUT VIC AB 0 CT1 27 (SUTURE) ×4
SUT VIC AB 0 CT1 27XBRD ANTBC (SUTURE) ×2 IMPLANT
SUT VIC AB 0 UR5 27 (SUTURE) ×4 IMPLANT
SUT VIC AB 2-0 SH 27 (SUTURE) ×4
SUT VIC AB 2-0 SH 27X BRD (SUTURE) ×2 IMPLANT
SUT VICRYL 0 UR6 27IN ABS (SUTURE) ×8 IMPLANT
SYR 27GX1/2 1ML LL SAFETY (SYRINGE) ×4 IMPLANT
TOWEL OR 17X26 10 PK STRL BLUE (TOWEL DISPOSABLE) ×4 IMPLANT
TOWEL OR NON WOVEN STRL DISP B (DISPOSABLE) ×4 IMPLANT
WATER STERILE IRR 1500ML POUR (IV SOLUTION) ×8 IMPLANT

## 2016-07-02 NOTE — Interval H&P Note (Signed)
History and Physical Interval Note:  07/02/2016 6:59 AM  Xavier Goodwin  has presented today for surgery, with the diagnosis of PROSTATE CANCER  The various methods of treatment have been discussed with the patient and family. After consideration of risks, benefits and other options for treatment, the patient has consented to  Procedure(s): XI ROBOTIC ASSISTED LAPAROSCOPIC RADICAL PROSTATECTOMY LEVEL 2 (N/A) BILATERAL PELVIC LYMPH NODE DISSECTION (Bilateral) as a surgical intervention .  The patient's history has been reviewed, patient examined, no change in status, stable for surgery.  I have reviewed the patient's chart and labs.  Questions were answered to the patient's satisfaction.     Daxtyn Rottenberg,LES

## 2016-07-02 NOTE — Progress Notes (Signed)
Post-op note  Subjective: The patient is doing well.  No complaints. Sleepy  Objective: Vital signs in last 24 hours: Temp:  [97.9 F (36.6 C)-99.1 F (37.3 C)] 98.3 F (36.8 C) (03/29 1233) Pulse Rate:  [77-95] 77 (03/29 1233) Resp:  [12-23] 14 (03/29 1233) BP: (119-159)/(72-98) 125/87 (03/29 1233) SpO2:  [97 %-100 %] 99 % (03/29 1233) Weight:  [111.6 kg (246 lb)] 111.6 kg (246 lb) (03/29 0528)  Intake/Output from previous day: No intake/output data recorded. Intake/Output this shift: Total I/O In: 2400 [I.V.:1400; IV Piggyback:1000] Out: 560 [Urine:300; Drains:60; Blood:200]  Physical Exam:  General: Alert and oriented. Abdomen: Soft, Nondistended. Incisions: Clean and dry. Urine: red  Lab Results:  Recent Labs  07/02/16 1119  HGB 13.9  HCT 39.5    Assessment/Plan: POD#0   1) Continue to monitor  2) DVT prophy, clears, IS, amb, pain control   LOS: 0 days   Xavier Goodwin 07/02/2016, 2:37 PM

## 2016-07-02 NOTE — Anesthesia Postprocedure Evaluation (Signed)
Anesthesia Post Note  Patient: Xavier Goodwin  Procedure(s) Performed: Procedure(s) (LRB): XI ROBOTIC ASSISTED LAPAROSCOPIC RADICAL PROSTATECTOMY LEVEL 2 (N/A) BILATERAL PELVIC LYMPH NODE DISSECTION (Bilateral)  Patient location during evaluation: PACU Anesthesia Type: General Level of consciousness: awake and alert Pain management: pain level controlled Vital Signs Assessment: post-procedure vital signs reviewed and stable Respiratory status: spontaneous breathing, nonlabored ventilation, respiratory function stable and patient connected to nasal cannula oxygen Cardiovascular status: blood pressure returned to baseline and stable Postop Assessment: no signs of nausea or vomiting Anesthetic complications: no       Last Vitals:  Vitals:   07/02/16 1233 07/02/16 1624  BP: 125/87 113/74  Pulse: 77 78  Resp: 14 18  Temp: 36.8 C 37.2 C    Last Pain:  Vitals:   07/02/16 1624  TempSrc: Oral  PainSc:                  Xavier Goodwin

## 2016-07-02 NOTE — Discharge Instructions (Signed)

## 2016-07-02 NOTE — Anesthesia Procedure Notes (Addendum)
Procedure Name: Intubation Date/Time: 07/02/2016 7:31 AM Performed by: Dione Booze Pre-anesthesia Checklist: Patient identified, Emergency Drugs available, Suction available and Patient being monitored Patient Re-evaluated:Patient Re-evaluated prior to inductionOxygen Delivery Method: Circle system utilized Preoxygenation: Pre-oxygenation with 100% oxygen Intubation Type: IV induction Ventilation: Oral airway inserted - appropriate to patient size and Two handed mask ventilation required Laryngoscope Size: Mac and 4 Grade View: Grade II Tube type: Oral Tube size: 7.5 mm Number of attempts: 1 Placement Confirmation: ETT inserted through vocal cords under direct vision,  positive ETCO2 and breath sounds checked- equal and bilateral Secured at: 23 cm Tube secured with: Tape Dental Injury: Teeth and Oropharynx as per pre-operative assessment  Comments: Intubated by Laurey Arrow, SRNA

## 2016-07-02 NOTE — Op Note (Signed)
Preoperative diagnosis: Clinically localized adenocarcinoma of the prostate (clinical stage T1c Nx Mx)  Postoperative diagnosis: Clinically localized adenocarcinoma of the prostate (clinical stage T1c Nx Mx)  Procedure:  1. Robotic assisted laparoscopic radical prostatectomy (bilateral nerve sparing) 2. Bilateral robotic assisted laparoscopic pelvic lymphadenectomy  Surgeon: Pryor Curia. M.D.  Assistant: Debbrah Alar, PA-C  An assistant was required for this surgical procedure.  The duties of the assistant included but were not limited to suctioning, passing suture, camera manipulation, retraction. This procedure would not be able to be performed without an Environmental consultant.  Anesthesia: General  Complications: None  EBL: 200 mL  IVF:  1200 mL crystalloid  Specimens: 1. Prostate and seminal vesicles 2. Right pelvic lymph nodes 3. Left pelvic lymph nodes  Disposition of specimens: Pathology  Drains: 1. 20 Fr coude catheter 2. # 19 Blake pelvic drain  Indication: Xavier Goodwin is a 56 y.o. year old patient with clinically localized prostate cancer.  After a thorough review of the management options for treatment of prostate cancer, he elected to proceed with surgical therapy and the above procedure(s).  We have discussed the potential benefits and risks of the procedure, side effects of the proposed treatment, the likelihood of the patient achieving the goals of the procedure, and any potential problems that might occur during the procedure or recuperation. Informed consent has been obtained.  Description of procedure:  The patient was taken to the operating room and a general anesthetic was administered. He was given preoperative antibiotics, placed in the dorsal lithotomy position, and prepped and draped in the usual sterile fashion. Next a preoperative timeout was performed. A urethral catheter was placed into the bladder and a site was selected near the umbilicus for  placement of the camera port. This was placed using a standard open Hassan technique which allowed entry into the peritoneal cavity under direct vision and without difficulty. An 8 mm robotic port was placed and a pneumoperitoneum established. The camera was then used to inspect the abdomen and there was no evidence of any intra-abdominal injuries or other abnormalities. The remaining abdominal ports were then placed. 8 mm robotic ports were placed in the right lower quadrant, left lower quadrant, and far left lateral abdominal wall. A 5 mm port was placed in the right upper quadrant and a 12 mm port was placed in the right lateral abdominal wall for laparoscopic assistance. All ports were placed under direct vision without difficulty. The surgical cart was then docked.   Utilizing the cautery scissors, the bladder was reflected posteriorly allowing entry into the space of Retzius and identification of the endopelvic fascia and prostate. The periprostatic fat was then removed from the prostate allowing full exposure of the endopelvic fascia. The endopelvic fascia was then incised from the apex back to the base of the prostate bilaterally and the underlying levator muscle fibers were swept laterally off the prostate thereby isolating the dorsal venous complex. The dorsal vein was then stapled and divided with a 45 mm Flex Echelon stapler. Attention then turned to the bladder neck which was divided anteriorly thereby allowing entry into the bladder and exposure of the urethral catheter. The catheter balloon was deflated and the catheter was brought into the operative field and used to retract the prostate anteriorly. The posterior bladder neck was then examined and was divided allowing further dissection between the bladder and prostate posteriorly until the vasa deferentia and seminal vessels were identified. The vasa deferentia were isolated, divided, and lifted anteriorly.  The seminal vesicles were dissected down  to their tips with care to control the seminal vascular arterial blood supply. These structures were then lifted anteriorly and the space between Denonvillier's fascia and the anterior rectum was developed with a combination of sharp and blunt dissection. This isolated the vascular pedicles of the prostate.  The lateral prostatic fascia was then sharply incised allowing release of the neurovascular bundles bilaterally. The vascular pedicles of the prostate were then ligated with Weck clips between the prostate and neurovascular bundles and divided with sharp cold scissor dissection resulting in neurovascular bundle preservation. The neurovascular bundles were then separated off the apex of the prostate and urethra bilaterally.  The urethra was then sharply transected allowing the prostate specimen to be disarticulated. The pelvis was copiously irrigated and hemostasis was ensured. There was no evidence for rectal injury.  Attention then turned to the right pelvic sidewall. The fibrofatty tissue between the external iliac vein, confluence of the iliac vessels, hypogastric artery, and Cooper's ligament was dissected free from the pelvic sidewall with care to preserve the obturator nerve. Weck clips were used for lymphostasis and hemostasis. An identical procedure was performed on the contralateral side and the lymphatic packets were removed for permanent pathologic analysis.  Attention then turned to the urethral anastomosis. A 2-0 Vicryl slip knot was placed between Denonvillier's fascia, the posterior bladder neck, and the posterior urethra to reapproximate these structures. A double-armed 3-0 Monocryl suture was then used to perform a 360 running tension-free anastomosis between the bladder neck and urethra. A new urethral catheter was then placed into the bladder and irrigated. There were no blood clots within the bladder and the anastomosis appeared to be watertight. A #19 Blake drain was then brought  through the left lateral 8 mm port site and positioned appropriately within the pelvis. It was secured to the skin with a nylon suture. The surgical cart was then undocked. The right lateral 12 mm port site was closed at the fascial level with a 0 Vicryl suture placed laparoscopically. All remaining ports were then removed under direct vision. The prostate specimen was removed intact within the Endopouch retrieval bag via the periumbilical camera port site. This fascial opening was closed with two running 0 Vicryl sutures. 0.25% Marcaine was then injected into all port sites and all incisions were reapproximated at the skin level with 4-0 Monocryl subcuticular sutures and Liquiband. The patient appeared to tolerate the procedure well and without complications. The patient was able to be extubated and transferred to the recovery unit in satisfactory condition.   Pryor Curia MD

## 2016-07-02 NOTE — Transfer of Care (Signed)
Immediate Anesthesia Transfer of Care Note  Patient: Xavier Goodwin  Procedure(s) Performed: Procedure(s): XI ROBOTIC ASSISTED LAPAROSCOPIC RADICAL PROSTATECTOMY LEVEL 2 (N/A) BILATERAL PELVIC LYMPH NODE DISSECTION (Bilateral)  Patient Location: PACU  Anesthesia Type:General  Level of Consciousness: awake, alert , oriented and patient cooperative  Airway & Oxygen Therapy: Patient Spontanous Breathing and Patient connected to face mask oxygen  Post-op Assessment: Report given to RN, Post -op Vital signs reviewed and stable and Patient moving all extremities  Post vital signs: Reviewed and stable  Last Vitals:  Vitals:   07/02/16 0511  BP: 119/72  Pulse: 86  Resp: 18  Temp: 36.6 C    Last Pain:  Vitals:   07/02/16 0528  TempSrc:   PainSc: 0-No pain      Patients Stated Pain Goal: 3 (15/86/82 5749)  Complications: No apparent anesthesia complications

## 2016-07-02 NOTE — Progress Notes (Signed)
Hgb. And Hct. Drawn by lab. 

## 2016-07-02 NOTE — Progress Notes (Signed)
Hgb. 13.9- Hct. 39.5- results noted

## 2016-07-02 NOTE — Anesthesia Preprocedure Evaluation (Addendum)
Anesthesia Evaluation  Patient identified by MRN, date of birth, ID band Patient awake    Reviewed: Allergy & Precautions, NPO status , Patient's Chart, lab work & pertinent test results  Airway Mallampati: II  TM Distance: >3 FB Neck ROM: Full    Dental no notable dental hx.    Pulmonary Current Smoker,    Pulmonary exam normal breath sounds clear to auscultation       Cardiovascular negative cardio ROS Normal cardiovascular exam Rhythm:Regular Rate:Normal     Neuro/Psych negative neurological ROS  negative psych ROS   GI/Hepatic negative GI ROS, Neg liver ROS,   Endo/Other  negative endocrine ROS  Renal/GU negative Renal ROS  negative genitourinary   Musculoskeletal negative musculoskeletal ROS (+)   Abdominal   Peds negative pediatric ROS (+)  Hematology negative hematology ROS (+)   Anesthesia Other Findings   Reproductive/Obstetrics negative OB ROS                            Anesthesia Physical Anesthesia Plan  ASA: II  Anesthesia Plan: General   Post-op Pain Management:    Induction: Intravenous  Airway Management Planned: Oral ETT  Additional Equipment:   Intra-op Plan:   Post-operative Plan: Extubation in OR  Informed Consent: I have reviewed the patients History and Physical, chart, labs and discussed the procedure including the risks, benefits and alternatives for the proposed anesthesia with the patient or authorized representative who has indicated his/her understanding and acceptance.   Dental advisory given  Plan Discussed with: CRNA  Anesthesia Plan Comments:         Anesthesia Quick Evaluation

## 2016-07-03 ENCOUNTER — Encounter (HOSPITAL_COMMUNITY): Payer: Self-pay | Admitting: Urology

## 2016-07-03 DIAGNOSIS — C61 Malignant neoplasm of prostate: Secondary | ICD-10-CM | POA: Diagnosis not present

## 2016-07-03 LAB — HEMOGLOBIN AND HEMATOCRIT, BLOOD
HEMATOCRIT: 35.9 % — AB (ref 39.0–52.0)
Hemoglobin: 12.9 g/dL — ABNORMAL LOW (ref 13.0–17.0)

## 2016-07-03 MED ORDER — HYDROCODONE-ACETAMINOPHEN 5-325 MG PO TABS
1.0000 | ORAL_TABLET | Freq: Four times a day (QID) | ORAL | Status: DC | PRN
  Administered 2016-07-03: 1 via ORAL
  Filled 2016-07-03: qty 1

## 2016-07-03 MED ORDER — BISACODYL 10 MG RE SUPP
10.0000 mg | Freq: Once | RECTAL | Status: AC
  Administered 2016-07-03: 10 mg via RECTAL
  Filled 2016-07-03: qty 1

## 2016-07-03 NOTE — Progress Notes (Signed)
Patient is stable for discharge. Discharge instructions and medications have been reviewed with the patient and his wife and all questions answered. Reviewed foley/leg bag teaching with the patient and his wife. They both demonstrated and verbalized understanding of foley care and post-op discharge instructions.   Othella Boyer [facility]

## 2016-07-03 NOTE — Care Management Note (Signed)
Case Management Note  Patient Details  Name: Xavier Goodwin MRN: [id number] Date of Birth: 03/24/1961  Subjective/Objective:55 y/o m admitted w/Prostate Ca. s/p Lap rad prostatectomy, From home.                    Action/Plan:d/c home.   Expected Discharge Date:  07/03/16               Expected Discharge Plan:  Home/Self Care  In-House Referral:     Discharge planning Services  CM Consult  Post Acute Care Choice:    Choice offered to:     DME Arranged:    DME Agency:     HH Arranged:    HH Agency:     Status of Service:  Completed, signed off  If discussed at [employer] of Stay Meetings, dates discussed:    Additional Comments:  Dessa Phi, RN 07/03/2016, 10:28 AM

## 2016-07-03 NOTE — Discharge Summary (Signed)
Date of admission: 07/02/2016  Date of discharge: 07/03/2016  Admission diagnosis: Prostate Cancer  Discharge diagnosis: Prostate Cancer  History and Physical: For full details, please see admission history and physical. Briefly, Xavier Goodwin is a 55 y.o. gentleman with localized prostate cancer.  After discussing management/treatment options, he elected to proceed with surgical treatment.  Hospital Course: Xavier Goodwin was taken to the operating room on 07/02/2016 and underwent a robotic assisted laparoscopic radical prostatectomy. He tolerated this procedure well and without complications. Postoperatively, he was able to be transferred to a regular hospital room following recovery from anesthesia.  He was able to begin ambulating the night of surgery. He remained hemodynamically stable overnight.  He had excellent urine output with appropriately minimal output from his pelvic drain and his pelvic drain was removed on POD #1.  He was transitioned to oral pain medication, tolerated a clear liquid diet, and had met all discharge criteria and was able to be discharged home later on POD#1.  Laboratory values:  Recent Labs  07/02/16 1119 07/03/16 0449  HGB 13.9 12.9*  HCT 39.5 35.9*    Disposition: Home  Discharge instruction: He was instructed to be ambulatory but to refrain from heavy lifting, strenuous activity, or driving. He was instructed on urethral catheter care.  Discharge medications:  Allergies as of 07/03/2016   No Known Allergies     Medication List    STOP taking these medications   aspirin 81 MG tablet   RAPAFLO 4 MG Caps capsule Generic drug:  silodosin     TAKE these medications   HYDROcodone-acetaminophen 5-325 MG tablet Commonly known as:  NORCO Take 1-2 tablets by mouth every 6 (six) hours as needed for moderate pain or severe pain.   nitroGLYCERIN 0.4 MG SL tablet Commonly known as:  NITROSTAT Place 1 tablet (0.4 mg total) under the tongue every 5  (five) minutes as needed for chest pain. Do not take more than 3 tablets   rosuvastatin 20 MG tablet Commonly known as:  CRESTOR Take 20 mg by mouth daily.   sulfamethoxazole-trimethoprim 800-160 MG tablet Commonly known as:  BACTRIM DS,SEPTRA DS Take 1 tablet by mouth 2 (two) times daily. Start the day prior to foley removal appointment       Followup: He will followup in 1 week for catheter removal and to discuss his surgical pathology results.   

## 2016-07-14 DIAGNOSIS — C61 Malignant neoplasm of prostate: Secondary | ICD-10-CM | POA: Diagnosis not present

## 2016-08-03 DIAGNOSIS — M6281 Muscle weakness (generalized): Secondary | ICD-10-CM | POA: Diagnosis not present

## 2016-08-04 DIAGNOSIS — K8020 Calculus of gallbladder without cholecystitis without obstruction: Secondary | ICD-10-CM

## 2016-08-04 DIAGNOSIS — A419 Sepsis, unspecified organism: Secondary | ICD-10-CM

## 2016-08-04 HISTORY — DX: Sepsis, unspecified organism: A41.9

## 2016-08-04 HISTORY — DX: Calculus of gallbladder without cholecystitis without obstruction: K80.20

## 2016-08-06 DIAGNOSIS — H524 Presbyopia: Secondary | ICD-10-CM | POA: Diagnosis not present

## 2016-08-09 ENCOUNTER — Inpatient Hospital Stay (HOSPITAL_COMMUNITY): Payer: Commercial Managed Care - HMO

## 2016-08-09 ENCOUNTER — Inpatient Hospital Stay (HOSPITAL_COMMUNITY)
Admission: EM | Admit: 2016-08-09 | Discharge: 2016-08-12 | DRG: 871 | Disposition: A | Discharge status: Home / Self Care | Payer: Commercial Managed Care - HMO | Attending: Internal Medicine | Admitting: Internal Medicine

## 2016-08-09 ENCOUNTER — Emergency Department (HOSPITAL_COMMUNITY): Payer: Commercial Managed Care - HMO

## 2016-08-09 ENCOUNTER — Encounter (HOSPITAL_COMMUNITY): Payer: Self-pay

## 2016-08-09 DIAGNOSIS — R109 Unspecified abdominal pain: Secondary | ICD-10-CM | POA: Diagnosis not present

## 2016-08-09 DIAGNOSIS — C61 Malignant neoplasm of prostate: Secondary | ICD-10-CM | POA: Diagnosis not present

## 2016-08-09 DIAGNOSIS — N1330 Unspecified hydronephrosis: Secondary | ICD-10-CM

## 2016-08-09 DIAGNOSIS — N179 Acute kidney failure, unspecified: Secondary | ICD-10-CM | POA: Diagnosis not present

## 2016-08-09 DIAGNOSIS — A419 Sepsis, unspecified organism: Secondary | ICD-10-CM | POA: Diagnosis not present

## 2016-08-09 DIAGNOSIS — R7989 Other specified abnormal findings of blood chemistry: Secondary | ICD-10-CM

## 2016-08-09 DIAGNOSIS — A4159 Other Gram-negative sepsis: Principal | ICD-10-CM | POA: Diagnosis present

## 2016-08-09 DIAGNOSIS — D649 Anemia, unspecified: Secondary | ICD-10-CM | POA: Diagnosis present

## 2016-08-09 DIAGNOSIS — R740 Nonspecific elevation of levels of transaminase and lactic acid dehydrogenase [LDH]: Secondary | ICD-10-CM | POA: Diagnosis not present

## 2016-08-09 DIAGNOSIS — Z79899 Other long term (current) drug therapy: Secondary | ICD-10-CM | POA: Diagnosis not present

## 2016-08-09 DIAGNOSIS — N19 Unspecified kidney failure: Secondary | ICD-10-CM

## 2016-08-09 DIAGNOSIS — D696 Thrombocytopenia, unspecified: Secondary | ICD-10-CM | POA: Diagnosis present

## 2016-08-09 DIAGNOSIS — B9689 Other specified bacterial agents as the cause of diseases classified elsewhere: Secondary | ICD-10-CM

## 2016-08-09 DIAGNOSIS — Z9079 Acquired absence of other genital organ(s): Secondary | ICD-10-CM | POA: Diagnosis not present

## 2016-08-09 DIAGNOSIS — E7800 Pure hypercholesterolemia, unspecified: Secondary | ICD-10-CM | POA: Diagnosis not present

## 2016-08-09 DIAGNOSIS — R17 Unspecified jaundice: Secondary | ICD-10-CM | POA: Diagnosis not present

## 2016-08-09 DIAGNOSIS — R6520 Severe sepsis without septic shock: Secondary | ICD-10-CM | POA: Diagnosis present

## 2016-08-09 DIAGNOSIS — N136 Pyonephrosis: Secondary | ICD-10-CM | POA: Diagnosis present

## 2016-08-09 DIAGNOSIS — F17210 Nicotine dependence, cigarettes, uncomplicated: Secondary | ICD-10-CM | POA: Diagnosis present

## 2016-08-09 DIAGNOSIS — R112 Nausea with vomiting, unspecified: Secondary | ICD-10-CM | POA: Diagnosis not present

## 2016-08-09 DIAGNOSIS — E876 Hypokalemia: Secondary | ICD-10-CM | POA: Diagnosis not present

## 2016-08-09 DIAGNOSIS — E872 Acidosis: Secondary | ICD-10-CM | POA: Diagnosis not present

## 2016-08-09 DIAGNOSIS — N132 Hydronephrosis with renal and ureteral calculous obstruction: Secondary | ICD-10-CM | POA: Diagnosis not present

## 2016-08-09 DIAGNOSIS — R7881 Bacteremia: Secondary | ICD-10-CM | POA: Diagnosis not present

## 2016-08-09 DIAGNOSIS — K8020 Calculus of gallbladder without cholecystitis without obstruction: Secondary | ICD-10-CM | POA: Diagnosis not present

## 2016-08-09 DIAGNOSIS — N170 Acute kidney failure with tubular necrosis: Secondary | ICD-10-CM | POA: Diagnosis present

## 2016-08-09 DIAGNOSIS — N390 Urinary tract infection, site not specified: Secondary | ICD-10-CM | POA: Diagnosis not present

## 2016-08-09 DIAGNOSIS — N201 Calculus of ureter: Secondary | ICD-10-CM | POA: Diagnosis not present

## 2016-08-09 LAB — BLOOD CULTURE ID PANEL (REFLEXED)
ACINETOBACTER BAUMANNII: NOT DETECTED
CANDIDA ALBICANS: NOT DETECTED
CANDIDA GLABRATA: NOT DETECTED
CANDIDA TROPICALIS: NOT DETECTED
Candida krusei: NOT DETECTED
Candida parapsilosis: NOT DETECTED
Carbapenem resistance: NOT DETECTED
ENTEROBACTER CLOACAE COMPLEX: DETECTED — AB
ESCHERICHIA COLI: NOT DETECTED
Enterobacteriaceae species: DETECTED — AB
Enterococcus species: NOT DETECTED
Haemophilus influenzae: NOT DETECTED
Klebsiella oxytoca: NOT DETECTED
Klebsiella pneumoniae: NOT DETECTED
LISTERIA MONOCYTOGENES: NOT DETECTED
NEISSERIA MENINGITIDIS: NOT DETECTED
PROTEUS SPECIES: NOT DETECTED
Pseudomonas aeruginosa: NOT DETECTED
STREPTOCOCCUS AGALACTIAE: NOT DETECTED
STREPTOCOCCUS PNEUMONIAE: NOT DETECTED
Serratia marcescens: NOT DETECTED
Staphylococcus aureus (BCID): NOT DETECTED
Staphylococcus species: NOT DETECTED
Streptococcus pyogenes: NOT DETECTED
Streptococcus species: NOT DETECTED

## 2016-08-09 LAB — URINALYSIS, ROUTINE W REFLEX MICROSCOPIC
BILIRUBIN URINE: NEGATIVE
Glucose, UA: NEGATIVE mg/dL
Ketones, ur: NEGATIVE mg/dL
NITRITE: NEGATIVE
PH: 5 (ref 5.0–8.0)
Protein, ur: 30 mg/dL — AB
SPECIFIC GRAVITY, URINE: 1.015 (ref 1.005–1.030)

## 2016-08-09 LAB — COMPREHENSIVE METABOLIC PANEL
ALK PHOS: 74 U/L (ref 38–126)
ALT: 25 U/L (ref 17–63)
AST: 31 U/L (ref 15–41)
Albumin: 3.5 g/dL (ref 3.5–5.0)
Anion gap: 10 (ref 5–15)
BILIRUBIN TOTAL: 1.9 mg/dL — AB (ref 0.3–1.2)
BUN: 14 mg/dL (ref 6–20)
CALCIUM: 9.1 mg/dL (ref 8.9–10.3)
CO2: 19 mmol/L — AB (ref 22–32)
CREATININE: 1.76 mg/dL — AB (ref 0.61–1.24)
Chloride: 108 mmol/L (ref 101–111)
GFR, EST AFRICAN AMERICAN: 48 mL/min — AB (ref >60)
GFR, EST NON AFRICAN AMERICAN: 41 mL/min — AB (ref >60)
Glucose, Bld: 193 mg/dL — ABNORMAL HIGH (ref 65–99)
Potassium: 3.2 mmol/L — ABNORMAL LOW (ref 3.5–5.1)
Sodium: 137 mmol/L (ref 135–145)
Total Protein: 7.1 g/dL (ref 6.5–8.1)

## 2016-08-09 LAB — CBC WITH DIFFERENTIAL/PLATELET
BASOS PCT: 0 %
Basophils Absolute: 0 10*3/uL (ref 0.0–0.1)
EOS ABS: 0 10*3/uL (ref 0.0–0.7)
Eosinophils Relative: 0 %
HCT: 38 % — ABNORMAL LOW (ref 39.0–52.0)
Hemoglobin: 13.3 g/dL (ref 13.0–17.0)
Lymphocytes Relative: 7 %
Lymphs Abs: 0.8 10*3/uL (ref 0.7–4.0)
MCH: 33 pg (ref 26.0–34.0)
MCHC: 35 g/dL (ref 30.0–36.0)
MCV: 94.3 fL (ref 78.0–100.0)
MONO ABS: 0.2 10*3/uL (ref 0.1–1.0)
MONOS PCT: 2 %
Neutro Abs: 10 10*3/uL — ABNORMAL HIGH (ref 1.7–7.7)
Neutrophils Relative %: 91 %
PLATELETS: 170 10*3/uL (ref 150–400)
RBC: 4.03 MIL/uL — ABNORMAL LOW (ref 4.22–5.81)
RDW: 13.2 % (ref 11.5–15.5)
WBC: 11 10*3/uL — ABNORMAL HIGH (ref 4.0–10.5)

## 2016-08-09 LAB — I-STAT CG4 LACTIC ACID, ED
LACTIC ACID, VENOUS: 1.4 mmol/L (ref 0.5–1.9)
Lactic Acid, Venous: 2.4 mmol/L (ref 0.5–1.9)

## 2016-08-09 LAB — PROTIME-INR
INR: 1.26
PROTHROMBIN TIME: 15.9 s — AB (ref 11.4–15.2)

## 2016-08-09 MED ORDER — ACETAMINOPHEN 325 MG PO TABS
650.0000 mg | ORAL_TABLET | Freq: Four times a day (QID) | ORAL | Status: DC | PRN
  Administered 2016-08-09 – 2016-08-10 (×3): 650 mg via ORAL
  Filled 2016-08-09 (×3): qty 2

## 2016-08-09 MED ORDER — KETOROLAC TROMETHAMINE 15 MG/ML IJ SOLN
15.0000 mg | Freq: Once | INTRAMUSCULAR | Status: AC
Start: 2016-08-09 — End: 2016-08-09
  Administered 2016-08-09: 15 mg via INTRAVENOUS
  Filled 2016-08-09: qty 1

## 2016-08-09 MED ORDER — ACETAMINOPHEN 650 MG RE SUPP: 650.0000 mg | Freq: Four times a day (QID) | RECTAL | Status: DC | PRN

## 2016-08-09 MED ORDER — HEPARIN SODIUM (PORCINE) 5000 UNIT/ML IJ SOLN
5000.0000 [IU] | Freq: Three times a day (TID) | INTRAMUSCULAR | Status: DC
  Administered 2016-08-09 – 2016-08-10 (×5): 5000 [IU] via SUBCUTANEOUS
  Filled 2016-08-09 (×6): qty 1

## 2016-08-09 MED ORDER — NICOTINE 21 MG/24HR TD PT24
21.0000 mg | MEDICATED_PATCH | Freq: Every day | TRANSDERMAL | Status: DC
  Administered 2016-08-09 – 2016-08-12 (×4): 21 mg via TRANSDERMAL
  Filled 2016-08-09 (×4): qty 1

## 2016-08-09 MED ORDER — SODIUM CHLORIDE 0.9 % IV SOLN
INTRAVENOUS | Status: DC
  Administered 2016-08-09: 11:00:00 via INTRAVENOUS
  Administered 2016-08-10: 100 mL/h via INTRAVENOUS
  Administered 2016-08-10: 04:00:00 via INTRAVENOUS

## 2016-08-09 MED ORDER — SODIUM CHLORIDE 0.9 % IV BOLUS (SEPSIS)
1000.0000 mL | Freq: Once | INTRAVENOUS | Status: AC
  Administered 2016-08-09: 1000 mL via INTRAVENOUS

## 2016-08-09 MED ORDER — ROSUVASTATIN CALCIUM 10 MG PO TABS
20.0000 mg | ORAL_TABLET | Freq: Every day | ORAL | Status: DC
  Administered 2016-08-09 – 2016-08-11 (×2): 20 mg via ORAL
  Filled 2016-08-09 (×2): qty 2

## 2016-08-09 MED ORDER — SODIUM CHLORIDE 0.9 % IV SOLN
1000.0000 mL | INTRAVENOUS | Status: DC
  Administered 2016-08-09: 1000 mL via INTRAVENOUS

## 2016-08-09 MED ORDER — ONDANSETRON HCL 4 MG/2ML IJ SOLN
4.0000 mg | Freq: Four times a day (QID) | INTRAMUSCULAR | Status: DC | PRN
Start: 2016-08-09 — End: 2016-08-12
  Administered 2016-08-09: 4 mg via INTRAVENOUS
  Filled 2016-08-09: qty 2

## 2016-08-09 MED ORDER — ASPIRIN EC 81 MG PO TBEC
81.0000 mg | DELAYED_RELEASE_TABLET | Freq: Every day | ORAL | Status: DC
  Administered 2016-08-09 – 2016-08-12 (×4): 81 mg via ORAL
  Filled 2016-08-09 (×4): qty 1

## 2016-08-09 MED ORDER — SODIUM CHLORIDE 0.9 % IV BOLUS (SEPSIS)
2000.0000 mL | Freq: Once | INTRAVENOUS | Status: AC
  Administered 2016-08-09: 2000 mL via INTRAVENOUS

## 2016-08-09 MED ORDER — DEXTROSE 5 % IV SOLN
2.0000 g | Freq: Two times a day (BID) | INTRAVENOUS | Status: DC
  Administered 2016-08-09 – 2016-08-11 (×4): 2 g via INTRAVENOUS
  Filled 2016-08-09 (×4): qty 2

## 2016-08-09 MED ORDER — DEXTROSE 5 % IV SOLN
2.0000 g | Freq: Once | INTRAVENOUS | Status: AC
  Administered 2016-08-09: 2 g via INTRAVENOUS
  Filled 2016-08-09: qty 2

## 2016-08-09 MED ORDER — FAMOTIDINE 20 MG PO TABS
20.0000 mg | ORAL_TABLET | Freq: Two times a day (BID) | ORAL | Status: DC
  Administered 2016-08-09 – 2016-08-11 (×4): 20 mg via ORAL
  Filled 2016-08-09 (×4): qty 1

## 2016-08-09 MED ORDER — POTASSIUM CHLORIDE CRYS ER 20 MEQ PO TBCR
40.0000 meq | EXTENDED_RELEASE_TABLET | Freq: Once | ORAL | Status: AC
  Administered 2016-08-09: 40 meq via ORAL
  Filled 2016-08-09: qty 2

## 2016-08-09 NOTE — Progress Notes (Signed)
Pharmacy Antibiotic Note  Xavier Goodwin is a 56 y.o. male s/p prostatectomy on 3/9 now with abdominal pain and vomiting admitted on 08/09/2016 with UTI.  Pharmacy has been consulted for cefepime dosing.  Plan: Cefepime 2 Gm IV q12h  Height: 6\' 1"  (185.4 cm) Weight: 235 lb (106.6 kg) IBW/kg (Calculated) : 79.9  Temp (24hrs), Avg:99.7 F (37.6 C), Min:99.2 F (37.3 C), Max:100.1 F (37.8 C)   Recent Labs Lab 08/09/16 0354  WBC 11.0*  CREATININE 1.76*    Estimated Creatinine Clearance: 59.3 mL/min (A) (by C-G formula based on SCr of 1.76 mg/dL (H)).    No Known Allergies  Antimicrobials this admission: 5/6 cefepime >>    >>   Dose adjustments this admission:   Microbiology results:  BCx:   UCx:    Sputum:   MRSA PCR:   Thank you for allowing pharmacy to be a part of this patient's care.  Dorrene German 08/09/2016 5:32 AM

## 2016-08-09 NOTE — ED Notes (Signed)
Pt took 2 extra strength tylenol about 70 minutes ago

## 2016-08-09 NOTE — Progress Notes (Signed)
Notified physician patient Temp 100.6, BP 96/57, and P 107.  Physician putting in order for bolus.

## 2016-08-09 NOTE — ED Notes (Signed)
Hospitalist at bedside 

## 2016-08-09 NOTE — ED Provider Notes (Signed)
[facility] DEPT Provider Note   CSN: [id number] Arrival date & time: 08/09/16  4008     History   Chief Complaint Fever  HPI Xavier Goodwin is a 56 y.o. male.  He had onset about 10:30 AM yesterday of subjective fever with associated chills. He also had some suprapubic abdominal pain which did radiate up to the upper abdomen. Pain is rated at 9/10. This was also associated with nausea and vomiting. Pain and nausea have since resolved. However, he continues to have chills and sweats. He is 5 weeks status post radical prostatectomy. He did not notice any dysuria or urinary urgency or frequency. He denies cough. He did have a brief episode of lightheadedness which has resolved. He did take a dose of hydrocodone at home which did give relief of pain.   The history is provided by the patient and the spouse.    Past Medical History:  Diagnosis Date  . Cancer [facility])    prostate cancer  . Chest pain    none recently since "i stopped drinking the "big gulp" of caffeine   . History of kidney stones   . Hypercholesteremia     Patient Active Problem List   Diagnosis Date Noted  . Prostate cancer ([place]) 07/02/2016  . Smoker 07/03/2013  . Chest pain 07/03/2013  . Hypercholesteremia     Past Surgical History:  Procedure Laterality Date  . CARDIAC CATHETERIZATION N/A 11/25/2015   Procedure: Left Heart Cath and Coronary Angiography;  Surgeon: Josue Hector, MD;  Location: [facility];  Service: Cardiovascular;  Laterality: N/A;  . NO PAST SURGERIES    . PELVIC LYMPH NODE DISSECTION Bilateral 07/02/2016   Procedure: BILATERAL PELVIC LYMPH NODE DISSECTION;  Surgeon: Raynelle Bring, MD;  Location: [facility];  Service: Urology;  Laterality: Bilateral;  . ROBOT ASSISTED LAPAROSCOPIC RADICAL PROSTATECTOMY N/A 07/02/2016   Procedure: XI ROBOTIC ASSISTED LAPAROSCOPIC RADICAL PROSTATECTOMY LEVEL 2;  Surgeon: Raynelle Bring, MD;  Location: [facility];  Service: Urology;  Laterality: N/A;        Home Medications    Prior to Admission medications   Medication Sig Start Date End Date Taking? Authorizing Provider  aspirin EC 81 MG tablet Take 81 mg by mouth daily.   Yes [provider]  HYDROcodone-acetaminophen (NORCO) 5-325 MG tablet Take 1-2 tablets by mouth every 6 (six) hours as needed for moderate pain or severe pain. 07/02/16  Yes Dancy, Estill Bamberg, PA-C  nitroGLYCERIN (NITROSTAT) 0.4 MG SL tablet Place 1 tablet (0.4 mg total) under the tongue every 5 (five) minutes as needed for chest pain. Do not take more than 3 tablets 11/07/15 08/09/16 Yes Josue Hector, MD  rosuvastatin (CRESTOR) 20 MG tablet Take 20 mg by mouth daily.   Yes [provider]  sildenafil (VIAGRA) 25 MG tablet Take 25 mg by mouth daily as needed for erectile dysfunction.   Yes [provider]  sulfamethoxazole-trimethoprim (BACTRIM DS,SEPTRA DS) 800-160 MG tablet Take 1 tablet by mouth 2 (two) times daily. Start the day prior to foley removal appointment Patient not taking: Reported on 08/09/2016 07/02/16   Debbrah Alar, PA-C    Family History Family History  Problem Relation Age of Onset  . Hypertension Father   . Heart attack Father   . Diabetes Mother   . Cancer Mother   . Lupus Sister     Social History Social History  Substance Use Topics  . Smoking status: Current Every Day Smoker    Packs/day: 1.00  Types: Cigarettes  . Smokeless tobacco: Never Used     Comment: greater than 20 years   . Alcohol use No     Allergies   Patient has no known allergies.   Review of Systems Review of Systems  All other systems reviewed and are negative.    Physical Exam Updated Vital Signs BP (!) 113/99 (BP Location: Right Arm)   Pulse 100   Temp 99.2 F (37.3 C) (Oral)   Resp 18   Ht 6\' 1"  (1.854 m)   Wt 235 lb (106.6 kg)   SpO2 100%   BMI 31.00 kg/m   Physical Exam  Nursing note and vitals reviewed.  56 year old male, resting comfortably and in no acute  distress. Vital signs are normal. Oxygen saturation is 100%, which is normal. He is totally nontoxic in appearance. Head is normocephalic and atraumatic. PERRLA, EOMI. Oropharynx is clear. Neck is nontender and supple without adenopathy or JVD. Back is nontender and there is no CVA tenderness. Lungs are clear without rales, wheezes, or rhonchi. Chest is nontender. Heart has regular rate and rhythm without murmur. Abdomen is soft, flat, with mild suprapubic tenderness. There is no rebound or guarding. There are no masses or hepatosplenomegaly and peristalsis is normoactive. Extremities have no cyanosis or edema, full range of motion is present. Skin is warm and dry without rash. Neurologic: Mental status is normal, cranial nerves are intact, there are no motor or sensory deficits.  ED Treatments / Results  Labs (all labs ordered are listed, but only abnormal results are displayed) Labs Reviewed  COMPREHENSIVE METABOLIC PANEL - Abnormal; Notable for the following:       Result Value   Potassium 3.2 (*)    CO2 19 (*)    Glucose, Bld 193 (*)    Creatinine, Ser 1.76 (*)    Total Bilirubin 1.9 (*)    GFR calc non Af Amer 41 (*)    GFR calc Af Amer 48 (*)    All other components within normal limits  CBC WITH DIFFERENTIAL/PLATELET - Abnormal; Notable for the following:    WBC 11.0 (*)    RBC 4.03 (*)    HCT 38.0 (*)    Neutro Abs 10.0 (*)    All other components within normal limits  PROTIME-INR - Abnormal; Notable for the following:    Prothrombin Time 15.9 (*)    All other components within normal limits  URINALYSIS, ROUTINE W REFLEX MICROSCOPIC - Abnormal; Notable for the following:    APPearance CLOUDY (*)    Hgb urine dipstick MODERATE (*)    Protein, ur 30 (*)    Leukocytes, UA LARGE (*)    Bacteria, UA RARE (*)    Squamous Epithelial / LPF 0-5 (*)    All other components within normal limits  I-STAT CG4 LACTIC ACID, ED - Abnormal; Notable for the following:    Lactic Acid,  Venous 2.40 (*)    All other components within normal limits  CULTURE, BLOOD (ROUTINE X 2)  CULTURE, BLOOD (ROUTINE X 2)  URINE CULTURE  I-STAT CG4 LACTIC ACID, ED  I-STAT CG4 LACTIC ACID, ED   Radiology Dg Chest 2 View  Result Date: 08/09/2016 CLINICAL DATA:  Mid abdominal pain and vomiting since 10:00 a.m. EXAM: CHEST  2 VIEW COMPARISON:  06/29/2016 FINDINGS: Minimal linear scarring or atelectasis in the left lung. The right lung is clear. No pleural effusions. Normal hilar, mediastinal and cardiac contours. IMPRESSION: Minimal linear scarring or atelectasis in  the left base. Otherwise unremarkable. Electronically Signed   By: Andreas Newport M.D.   On: 08/09/2016 04:47    Procedures Procedures (including critical care time) CRITICAL CARE Performed by: XBMWU,[email or web address] Total critical care time: 50 minutes Critical care time was exclusive of separately billable procedures and treating other patients. Critical care was necessary to treat or prevent imminent or life-threatening deterioration. Critical care was time spent personally by me on the following activities: development of treatment plan with patient and/or surrogate as well as nursing, discussions with consultants, evaluation of patient's response to treatment, examination of patient, obtaining history from patient or surrogate, ordering and performing treatments and interventions, ordering and review of laboratory studies, ordering and review of radiographic studies, pulse oximetry and re-evaluation of patient's condition.  Medications Ordered in ED Medications  0.9 %  sodium chloride infusion (1,000 mLs Intravenous New Bag/Given 08/09/16 0543)  ceFEPIme (MAXIPIME) 2 g in dextrose 5 % 50 mL IVPB (not administered)  sodium chloride 0.9 % bolus 2,000 mL (not administered)  sodium chloride 0.9 % bolus 1,000 mL (1,000 mLs Intravenous New Bag/Given 08/09/16 0543)  ceFEPIme (MAXIPIME) 2 g in dextrose 5 % 50 mL IVPB (0 g Intravenous Stopped  08/09/16 0629)  potassium chloride SA (K-DUR,KLOR-CON) CR tablet 40 mEq (40 mEq Oral Given 08/09/16 0544)     Initial Impression / Assessment and Plan / ED Course  I have reviewed the triage vital signs and the nursing notes.  Pertinent labs & imaging results that were available during my care of the patient were reviewed by me and considered in my medical decision making (see chart for details).  Fever and chills in patient who is postop radical prostatectomy. Old records are reviewed confirming radical prostatectomy done approximately 5 weeks ago. Although his current vital signs are normal, he did have 2 blood pressures recorded with systolic blood pressure in the mid 80s. He has never been tachycardic. Screening labs to show a rise in serum creatinine over baseline and leukocytosis with left shift. Also incidentally noted is mild elevation of bilirubin of uncertain cause - possible Gilbert's Disease. Other hepatic function tests are normal. Urinalysis is consistent with UTI. Lactic acid level is pending. Initial hypotension is concerning for sepsis, but has normalized without any treatment. He is given an initial bolus of normal saline. If lactic acid level is significantly elevated, will proceed with full early goal-directed fluid that treatment. He is started on antibiotics for healthcare associated urinary tract infection.  Actiq acid level is come back mildly elevated at 2.4. He continues to run mildly low blood pressures but not in the overtly hypotensive range. He is not tachycardic. However, based on initial vital signs in ongoing borderline hypertension, it is elected to give him the rest of the fluid bolus for early goal-directed therapy. Case is discussed with Dr. Cruzita Lederer [facility] who agrees to admit the patient.  Final Clinical Impressions(s) / ED Diagnoses   Final diagnoses:  Urinary tract infection without hematuria, site unspecified  Acute kidney injury (nontraumatic)  (HCC)  Elevated lactic acid level    New Prescriptions New Prescriptions   No medications on file     Delora Fuel, MD 04/07/70 [phone]

## 2016-08-09 NOTE — H&P (Addendum)
History and Physical    Xavier Goodwin DOB: 1960-07-23 DOA: 08/09/2016  I have briefly reviewed the patient's prior medical records in [facility]  PCP: Alroy Dust, L.Marlou Sa, MD  Patient coming from: Home  Chief Complaint: Lower abdominal pain, fever and chills  HPI: Xavier Goodwin is a 56 y.o. male with medical history significant of hyperlipidemia, tobacco abuse, prostate cancer status post recent robotic assisted radical prostatectomy about 6 weeks ago by Dr. Alinda Money, presents to the emergency room with chief complaint of lower abdominal pain, fever and chills over the last 24 hours as well as nausea and vomiting.  Patient states that after his surgery, he had a Foley catheter for about a week, and has been having intermittent dysuria and discomfort over the last several weeks.  He denies any fever or chills at that time.  He has no chest pain or shortness of breath currently.  His symptoms started Saturday morning.  He has been feeling lightheaded with ambulation throughout the day yesterday.  His wife, who is at bedside, so the patient that was breathing fast this morning and had Reiger's, and decided to bring him to the emergency room.  Patient denies prior history of urinary tract infections.  ED Course: On presentation in the ED, he was febrile to 100.1, his heart rate was 120s and he was hypotensive with a blood pressure of 84/50.  He was satting well on room air.  With fluids, his blood pressure improved within normal range. He was given cefepime, cultures were obtained as per sepsis protocol and TRH was asked for admission.  Review of Systems: As per HPI otherwise 10 point review of systems negative.   Past Medical History:  Diagnosis Date  . Cancer [facility])    prostate cancer  . Chest pain    none recently since "i stopped drinking the "big gulp" of caffeine   . History of kidney stones   . Hypercholesteremia     Past Surgical History:  Procedure Laterality Date   . CARDIAC CATHETERIZATION N/A 11/25/2015   Procedure: Left Heart Cath and Coronary Angiography;  Surgeon: Josue Hector, MD;  Location: [facility];  Service: Cardiovascular;  Laterality: N/A;  . NO PAST SURGERIES    . PELVIC LYMPH NODE DISSECTION Bilateral 07/02/2016   Procedure: BILATERAL PELVIC LYMPH NODE DISSECTION;  Surgeon: Raynelle Bring, MD;  Location: [facility];  Service: Urology;  Laterality: Bilateral;  . ROBOT ASSISTED LAPAROSCOPIC RADICAL PROSTATECTOMY N/A 07/02/2016   Procedure: XI ROBOTIC ASSISTED LAPAROSCOPIC RADICAL PROSTATECTOMY LEVEL 2;  Surgeon: Raynelle Bring, MD;  Location: [facility];  Service: Urology;  Laterality: N/A;     reports that he has been smoking Cigarettes.  He has been smoking about 1.00 pack per day. He has never used smokeless tobacco. He reports that he does not drink alcohol or use drugs.  No Known Allergies  Family History  Problem Relation Age of Onset  . Hypertension Father   . Heart attack Father   . Diabetes Mother   . Cancer Mother   . Lupus Sister     Prior to Admission medications   Medication Sig Start Date End Date Taking? Authorizing Provider  aspirin EC 81 MG tablet Take 81 mg by mouth daily.   Yes [provider]  HYDROcodone-acetaminophen (NORCO) 5-325 MG tablet Take 1-2 tablets by mouth every 6 (six) hours as needed for moderate pain or severe pain. 07/02/16  Yes Dancy, Estill Bamberg, PA-C  nitroGLYCERIN (NITROSTAT) 0.4 MG  SL tablet Place 1 tablet (0.4 mg total) under the tongue every 5 (five) minutes as needed for chest pain. Do not take more than 3 tablets 11/07/15 08/09/16 Yes Josue Hector, MD  rosuvastatin (CRESTOR) 20 MG tablet Take 20 mg by mouth daily.   Yes [provider]  sildenafil (VIAGRA) 25 MG tablet Take 25 mg by mouth daily as needed for erectile dysfunction.   Yes [provider]  sulfamethoxazole-trimethoprim (BACTRIM DS,SEPTRA DS) 800-160 MG tablet Take 1 tablet by mouth 2 (two) times daily. Start  the day prior to foley removal appointment Patient not taking: Reported on 08/09/2016 07/02/16   Debbrah Alar, PA-C    Physical Exam: Vitals:   08/09/16 0543 08/09/16 0600 08/09/16 0613 08/09/16 0702  BP:  104/74 104/74 100/71  Pulse:  94 (!) 102 93  Resp:  (!) 32 (!) 25 (!) 32  Temp:    99 F (37.2 C)  TempSrc:    Oral  SpO2:  97% 97% 96%  Weight: 106.6 kg (235 lb)     Height: 6\' 1"  (1.854 m)      Constitutional: NAD, calm, comfortable Eyes: PERRL, lids and conjunctivae normal ENMT: Mucous membranes are moist. Posterior pharynx clear of any exudate or lesions.Normal dentition.  Neck: normal, supple, no masses, no thyromegaly Respiratory: clear to auscultation bilaterally, no wheezing, no crackles. Normal respiratory effort. No accessory muscle use.  Cardiovascular: Regular rate and rhythm, no murmurs / rubs / gallops. No extremity edema. 2+ pedal pulses.  Abdomen: no tenderness, no masses palpated. Bowel sounds positive.  Musculoskeletal: no clubbing / cyanosis. Normal muscle tone.  Skin: no rashes, lesions, ulcers. No induration Neurologic: CN 2-12 grossly intact. Strength 5/5 in all 4.  Psychiatric: Normal judgment and insight. Alert and oriented x 3. Normal mood.   Labs on Admission: I have personally reviewed following labs and imaging studies  CBC:  Recent Labs Lab 08/09/16 0354  WBC 11.0*  NEUTROABS 10.0*  HGB 13.3  HCT 38.0*  MCV 94.3  PLT 381   Basic Metabolic Panel:  Recent Labs Lab 08/09/16 0354  NA 137  K 3.2*  CL 108  CO2 19*  GLUCOSE 193*  BUN 14  CREATININE 1.76*  CALCIUM 9.1   GFR: Estimated Creatinine Clearance: 59.3 mL/min (A) (by C-G formula based on SCr of 1.76 mg/dL (H)). Liver Function Tests:  Recent Labs Lab 08/09/16 0354  AST 31  ALT 25  ALKPHOS 74  BILITOT 1.9*  PROT 7.1  ALBUMIN 3.5   No results for input(s): LIPASE, AMYLASE in the last 168 hours. No results for input(s): AMMONIA in the last 168 hours. Coagulation  Profile:  Recent Labs Lab 08/09/16 0354  INR 1.26   Cardiac Enzymes: No results for input(s): CKTOTAL, CKMB, CKMBINDEX, TROPONINI in the last 168 hours. BNP (last 3 results) No results for input(s): PROBNP in the last 8760 hours. HbA1C: No results for input(s): HGBA1C in the last 72 hours. CBG: No results for input(s): GLUCAP in the last 168 hours. Lipid Profile: No results for input(s): CHOL, HDL, LDLCALC, TRIG, CHOLHDL, LDLDIRECT in the last 72 hours. Thyroid Function Tests: No results for input(s): TSH, T4TOTAL, FREET4, T3FREE, THYROIDAB in the last 72 hours. Anemia Panel: No results for input(s): VITAMINB12, FOLATE, FERRITIN, TIBC, IRON, RETICCTPCT in the last 72 hours. Urine analysis:    Component Value Date/Time   COLORURINE YELLOW 08/09/2016 0354   APPEARANCEUR CLOUDY (A) 08/09/2016 0354   LABSPEC 1.015 08/09/2016 0354   PHURINE 5.0 08/09/2016  [place] 08/09/2016 0354   HGBUR MODERATE (A) 08/09/2016 0354   BILIRUBINUR NEGATIVE 08/09/2016 0354   KETONESUR NEGATIVE 08/09/2016 0354   PROTEINUR 30 (A) 08/09/2016 0354   NITRITE NEGATIVE 08/09/2016 0354   LEUKOCYTESUR LARGE (A) 08/09/2016 0354     Radiological Exams on Admission: Dg Chest 2 View  Result Date: 08/09/2016 CLINICAL DATA:  Mid abdominal pain and vomiting since 10:00 a.m. EXAM: CHEST  2 VIEW COMPARISON:  06/29/2016 FINDINGS: Minimal linear scarring or atelectasis in the left lung. The right lung is clear. No pleural effusions. Normal hilar, mediastinal and cardiac contours. IMPRESSION: Minimal linear scarring or atelectasis in the left base. Otherwise unremarkable. Electronically Signed   By: Andreas Newport M.D.   On: 08/09/2016 04:47   Assessment/Plan Active Problems:   Hypercholesteremia   Prostate cancer ([place])   Sepsis secondary to UTI ([place])   AKI (acute kidney injury) ([place])   Sepsis secondary to UTI -Patient nontoxic-appearing, his blood pressure has improved with fluids, will  admit to regular floor, continue cefepime as started in the ED. -Blood cultures and urine cultures are pending  Acute kidney injury -Possibly in the setting of acute infectious process, will rule out postobstructive kidney injury with renal ultrasound  Hyperlipidemia -Continue statin  Tobacco abuse -Nicotine patch  Prostate cancer -Status post radical prostatectomy  Addendum Renal ultrasound shows mild hydronephrosis on the right, no left hydronephrosis.  Discussed with urology on-call, Dr. Karsten Ro, who reviewed the images, and recommended continued medical management for now, if patient does not respond to antibiotics initially may need a CT scan to reevaluate  DVT prophylaxis: Lovenox  Code Status: Full code  Family Communication: wife bedside Disposition Plan: admit to [place], home when ready  Consults called: none      Admission status: Inpatient   At the time of admission, it appears that the appropriate admission status for this patient is INPATIENT. This is judged to be reasonable and necessary in order to provide the required high service intensity to ensure the patient's safety given the presenting symptoms, physical exam findings, and initial radiographic and laboratory data in the context of their chronic comorbidities. Current circumstances are sepsis, renal failure, and it is felt to place patient at high risk for further clinical deterioration threatening life, limb, or organ. Moreover, it is my clinical judgment that the patient will require inpatient hospital care spanning beyond 2 midnights from the point of admission and that early discharge would result in unnecessary risk of decompensation and readmission or threat to life, limb or bodily function.   Marzetta Board, MD [facility] Pager [phone]  If 7PM-7AM, please contact night-coverage [email or web address] Password [email or web address]  08/09/2016, 8:12 AM

## 2016-08-09 NOTE — Progress Notes (Signed)
Page: RM [phone] Scholz's temp is 101.8 and he can't have Tylenol again for another 2hrs. Can we get an order for Ibuprofen to alternate with the Tylenol?

## 2016-08-09 NOTE — ED Notes (Signed)
Called floor to give report, RN unavailable at this time. Awaiting a call back.  

## 2016-08-09 NOTE — ED Notes (Addendum)
Pt had mid abdominal pain and vomiting around 10am yesterday. Pt had chills and felt hot to touch at home. Denies diarrhea. Has been able to keep fluids down. Had a prostatectomy march 9th.

## 2016-08-10 ENCOUNTER — Inpatient Hospital Stay (HOSPITAL_COMMUNITY): Payer: Commercial Managed Care - HMO | Admitting: Anesthesiology

## 2016-08-10 ENCOUNTER — Encounter (HOSPITAL_COMMUNITY)
Admission: EM | Disposition: A | Discharge status: Home / Self Care | Payer: Self-pay | Source: Home / Self Care | Attending: Internal Medicine

## 2016-08-10 ENCOUNTER — Encounter (HOSPITAL_COMMUNITY): Payer: Self-pay | Admitting: Certified Registered"

## 2016-08-10 ENCOUNTER — Inpatient Hospital Stay (HOSPITAL_COMMUNITY): Payer: Commercial Managed Care - HMO

## 2016-08-10 DIAGNOSIS — E7800 Pure hypercholesterolemia, unspecified: Secondary | ICD-10-CM

## 2016-08-10 DIAGNOSIS — N1330 Unspecified hydronephrosis: Secondary | ICD-10-CM

## 2016-08-10 DIAGNOSIS — R17 Unspecified jaundice: Secondary | ICD-10-CM

## 2016-08-10 DIAGNOSIS — R7881 Bacteremia: Secondary | ICD-10-CM

## 2016-08-10 DIAGNOSIS — R7989 Other specified abnormal findings of blood chemistry: Secondary | ICD-10-CM

## 2016-08-10 DIAGNOSIS — E876 Hypokalemia: Secondary | ICD-10-CM

## 2016-08-10 DIAGNOSIS — B9689 Other specified bacterial agents as the cause of diseases classified elsewhere: Secondary | ICD-10-CM

## 2016-08-10 HISTORY — PX: CYSTOSCOPY W/ URETERAL STENT PLACEMENT: SHX1429

## 2016-08-10 LAB — HIV ANTIBODY (ROUTINE TESTING W REFLEX): HIV Screen 4th Generation wRfx: NONREACTIVE

## 2016-08-10 LAB — COMPREHENSIVE METABOLIC PANEL
ALT: 24 U/L (ref 17–63)
ALT: 25 U/L (ref 17–63)
ANION GAP: 6 (ref 5–15)
AST: 27 U/L (ref 15–41)
AST: 26 U/L (ref 15–41)
Albumin: 2.7 g/dL — ABNORMAL LOW (ref 3.5–5.0)
Albumin: 2.9 g/dL — ABNORMAL LOW (ref 3.5–5.0)
Alkaline Phosphatase: 65 U/L (ref 38–126)
Alkaline Phosphatase: 69 U/L (ref 38–126)
Anion gap: 7 (ref 5–15)
BILIRUBIN TOTAL: 2.7 mg/dL — AB (ref 0.3–1.2)
BUN: 19 mg/dL (ref 6–20)
BUN: 20 mg/dL (ref 6–20)
CHLORIDE: 110 mmol/L (ref 101–111)
CHLORIDE: 110 mmol/L (ref 101–111)
CO2: 23 mmol/L (ref 22–32)
CO2: 23 mmol/L (ref 22–32)
CREATININE: 1.94 mg/dL — AB (ref 0.61–1.24)
Calcium: 8.2 mg/dL — ABNORMAL LOW (ref 8.9–10.3)
Calcium: 8.3 mg/dL — ABNORMAL LOW (ref 8.9–10.3)
Creatinine, Ser: 1.91 mg/dL — ABNORMAL HIGH (ref 0.61–1.24)
GFR calc Af Amer: 42 mL/min — ABNORMAL LOW (ref >60)
GFR calc Af Amer: 43 mL/min — ABNORMAL LOW (ref >60)
GFR, EST NON AFRICAN AMERICAN: 37 mL/min — AB (ref >60)
GFR, EST NON AFRICAN AMERICAN: 37 mL/min — AB (ref >60)
Glucose, Bld: 113 mg/dL — ABNORMAL HIGH (ref 65–99)
Glucose, Bld: 107 mg/dL — ABNORMAL HIGH (ref 65–99)
POTASSIUM: 3.7 mmol/L (ref 3.5–5.1)
Potassium: 4.1 mmol/L (ref 3.5–5.1)
Sodium: 140 mmol/L (ref 135–145)
Sodium: 139 mmol/L (ref 135–145)
TOTAL PROTEIN: 6 g/dL — AB (ref 6.5–8.1)
Total Bilirubin: 2.5 mg/dL — ABNORMAL HIGH (ref 0.3–1.2)
Total Protein: 6.4 g/dL — ABNORMAL LOW (ref 6.5–8.1)

## 2016-08-10 LAB — CBC
HCT: 30.7 % — ABNORMAL LOW (ref 39.0–52.0)
Hemoglobin: 11.1 g/dL — ABNORMAL LOW (ref 13.0–17.0)
MCH: 33.7 pg (ref 26.0–34.0)
MCHC: 36.2 g/dL — AB (ref 30.0–36.0)
MCV: 93.3 fL (ref 78.0–100.0)
PLATELETS: 105 10*3/uL — AB (ref 150–400)
RBC: 3.29 MIL/uL — ABNORMAL LOW (ref 4.22–5.81)
RDW: 13.6 % (ref 11.5–15.5)
WBC: 17.3 10*3/uL — AB (ref 4.0–10.5)

## 2016-08-10 LAB — GRAM STAIN

## 2016-08-10 SURGERY — CYSTOSCOPY, WITH RETROGRADE PYELOGRAM AND URETERAL STENT INSERTION
Anesthesia: General | Laterality: Right

## 2016-08-10 MED ORDER — FENTANYL CITRATE (PF) 100 MCG/2ML IJ SOLN: 25.0000 ug | INTRAMUSCULAR | Status: DC | PRN

## 2016-08-10 MED ORDER — FENTANYL CITRATE (PF) 100 MCG/2ML IJ SOLN
INTRAMUSCULAR | Status: DC | PRN
  Administered 2016-08-10: 100 ug via INTRAVENOUS
  Administered 2016-08-10: 50 ug via INTRAVENOUS

## 2016-08-10 MED ORDER — ACETAMINOPHEN 500 MG PO TABS
1000.0000 mg | ORAL_TABLET | ORAL | Status: DC | PRN
  Administered 2016-08-10 – 2016-08-11 (×2): 1000 mg via ORAL
  Filled 2016-08-10 (×2): qty 2

## 2016-08-10 MED ORDER — IOPAMIDOL (ISOVUE-300) INJECTION 61%
75.0000 mL | Freq: Once | INTRAVENOUS | Status: AC | PRN
  Administered 2016-08-10: 75 mL via INTRAVENOUS

## 2016-08-10 MED ORDER — LIDOCAINE 2% (20 MG/ML) 5 ML SYRINGE
INTRAMUSCULAR | Status: AC
  Filled 2016-08-10: qty 5

## 2016-08-10 MED ORDER — PROPOFOL 10 MG/ML IV BOLUS
INTRAVENOUS | Status: AC
  Filled 2016-08-10: qty 20

## 2016-08-10 MED ORDER — 0.9 % SODIUM CHLORIDE (POUR BTL) OPTIME
TOPICAL | Status: DC | PRN
  Administered 2016-08-10: 1000 mL

## 2016-08-10 MED ORDER — FENTANYL CITRATE (PF) 100 MCG/2ML IJ SOLN
INTRAMUSCULAR | Status: AC
  Filled 2016-08-10: qty 2

## 2016-08-10 MED ORDER — PROMETHAZINE HCL 25 MG/ML IJ SOLN: 6.2500 mg | INTRAMUSCULAR | Status: DC | PRN

## 2016-08-10 MED ORDER — MIDAZOLAM HCL 5 MG/5ML IJ SOLN
INTRAMUSCULAR | Status: DC | PRN
  Administered 2016-08-10: 2 mg via INTRAVENOUS

## 2016-08-10 MED ORDER — PROPOFOL 10 MG/ML IV BOLUS
INTRAVENOUS | Status: DC | PRN
  Administered 2016-08-10: 200 mg via INTRAVENOUS

## 2016-08-10 MED ORDER — SODIUM CHLORIDE 0.9 % IV BOLUS (SEPSIS)
500.0000 mL | Freq: Once | INTRAVENOUS | Status: AC
  Administered 2016-08-10: 500 mL via INTRAVENOUS

## 2016-08-10 MED ORDER — IOPAMIDOL (ISOVUE-300) INJECTION 61%
INTRAVENOUS | Status: AC
  Administered 2016-08-10: 14:00:00
  Filled 2016-08-10: qty 75

## 2016-08-10 MED ORDER — IOPAMIDOL (ISOVUE-300) INJECTION 61%
INTRAVENOUS | Status: AC
  Administered 2016-08-10: 15 mL
  Filled 2016-08-10: qty 30

## 2016-08-10 MED ORDER — SUCCINYLCHOLINE CHLORIDE 200 MG/10ML IV SOSY
PREFILLED_SYRINGE | INTRAVENOUS | Status: AC
  Filled 2016-08-10: qty 10

## 2016-08-10 MED ORDER — LIDOCAINE 2% (20 MG/ML) 5 ML SYRINGE
INTRAMUSCULAR | Status: DC | PRN
  Administered 2016-08-10: 100 mg via INTRAVENOUS

## 2016-08-10 MED ORDER — IOPAMIDOL (ISOVUE-300) INJECTION 61%: 15.0000 mL | Freq: Once | INTRAVENOUS | Status: DC | PRN

## 2016-08-10 MED ORDER — SODIUM CHLORIDE 0.9 % IR SOLN
Status: DC | PRN
  Administered 2016-08-10: 3000 mL via INTRAVESICAL

## 2016-08-10 MED ORDER — MIDAZOLAM HCL 2 MG/2ML IJ SOLN
INTRAMUSCULAR | Status: AC
  Filled 2016-08-10: qty 2

## 2016-08-10 MED ORDER — SUCCINYLCHOLINE CHLORIDE 200 MG/10ML IV SOSY
PREFILLED_SYRINGE | INTRAVENOUS | Status: DC | PRN
  Administered 2016-08-10: 160 mg via INTRAVENOUS

## 2016-08-10 SURGICAL SUPPLY — 14 items
BAG URO CATCHER STRL LF (MISCELLANEOUS) ×3 IMPLANT
CATH FOLEY 2WAY 5CC 16FR (CATHETERS) ×3
CATH URET 5FR 28IN OPEN ENDED (CATHETERS) IMPLANT
CATH URTH STD 16FR FL 2W DRN (CATHETERS) IMPLANT
CLOTH BEACON ORANGE TIMEOUT ST (SAFETY) ×3 IMPLANT
COVER SURGICAL LIGHT HANDLE (MISCELLANEOUS) ×3 IMPLANT
GLOVE SURG SS PI 8.0 STRL IVOR (GLOVE) IMPLANT
GOWN STRL REUS W/TWL XL LVL3 (GOWN DISPOSABLE) ×3 IMPLANT
GUIDEWIRE STR DUAL SENSOR (WIRE) ×3 IMPLANT
MANIFOLD NEPTUNE II (INSTRUMENTS) ×3 IMPLANT
PACK CYSTO (CUSTOM PROCEDURE TRAY) ×3 IMPLANT
STENT URET 6FRX26 CONTOUR (STENTS) ×2 IMPLANT
TUBING CONNECTING 10 (TUBING) ×2 IMPLANT
TUBING CONNECTING 10' (TUBING) ×1

## 2016-08-10 NOTE — Anesthesia Preprocedure Evaluation (Signed)
Anesthesia Evaluation  Patient identified by MRN, date of birth, ID band Patient awake    Reviewed: Allergy & Precautions, NPO status , Patient's Chart, lab work & pertinent test resultsPreop documentation limited or incomplete due to emergent nature of procedure.  Airway Mallampati: II  TM Distance: >3 FB Neck ROM: Full    Dental  (+) Teeth Intact, Dental Advisory Given   Pulmonary Current Smoker,    Pulmonary exam normal breath sounds clear to auscultation       Cardiovascular negative cardio ROS Normal cardiovascular exam Rhythm:Regular Rate:Normal     Neuro/Psych negative neurological ROS  negative psych ROS   GI/Hepatic   Endo/Other  Obesity   Renal/GU Renal Insufficiency and ARFRenal diseaseUreteral stone with sepsis   Prostate cancer    Musculoskeletal   Abdominal   Peds  Hematology  (+) Blood dyscrasia (Thrombocytopenia), anemia ,   Anesthesia Other Findings   Reproductive/Obstetrics                             Anesthesia Physical Anesthesia Plan  ASA: II and emergent  Anesthesia Plan: General   Post-op Pain Management:    Induction: Intravenous, Rapid sequence and Cricoid pressure planned  Airway Management Planned: Oral ETT  Additional Equipment:   Intra-op Plan:   Post-operative Plan: Extubation in OR  Informed Consent: I have reviewed the patients History and Physical, chart, labs and discussed the procedure including the risks, benefits and alternatives for the proposed anesthesia with the patient or authorized representative who has indicated his/her understanding and acceptance.   Dental advisory given  Plan Discussed with: CRNA  Anesthesia Plan Comments: (Risks/benefits of general anesthesia discussed with patient including risk of damage to teeth, lips, gum, and tongue, nausea/vomiting, allergic reactions to medications, and the possibility of heart  attack, stroke and death.  All patient questions answered.  Patient wishes to proceed.)        Anesthesia Quick Evaluation

## 2016-08-10 NOTE — Care Management Note (Signed)
Case Management Note  Patient Details  Name: KYREE ADRIANO MRN: [id number] Date of Birth: 1960/04/24  Subjective/Objective:    Sepsis and aki/hydronephrosis                Action/Plan: Date:  Aug 10, 2016 Chart reviewed for concurrent status and case management needs. Will continue to follow patient progress. Discharge Planning: following for needs Expected discharge date: [id number] Velva Harman, BSN, [place], [place]  Expected Discharge Date:  08/12/16               Expected Discharge Plan:  Home/Self Care  In-House Referral:     Discharge planning Services  CM Consult  Post Acute Care Choice:    Choice offered to:     DME Arranged:    DME Agency:     HH Arranged:    HH Agency:     Status of Service:  In process, will continue to follow  If discussed at Long Length of Stay Meetings, dates discussed:    Additional Comments:  Leeroy Cha, RN 08/10/2016, 8:35 AM

## 2016-08-10 NOTE — Progress Notes (Signed)
Pt transported to radiology, temp 103.1, Dr Wynelle Cleveland made aware, no new orders at this time

## 2016-08-10 NOTE — Op Note (Signed)
Preoperative Diagnosis: Right proximal ureteral stone with sepsis  Postoperative Diagnosis:  Same  Procedure(s) Performed:   1. Cystourethroscopy 2. Right ureteral stent placement, 6 Fr x 26 cm double J type, without string 3. Foley catheter placement, simple 4. Intraoperative fluoroscopy with interpretation <1hr  Surgeons:   Primary: Irine Seal, MD Resident - Assisting: Rosamaria Lints, MD   Anesthesia:  General via endotracheal tube Anesthesiologist: Catalina Gravel, MD CRNA: Noralyn Pick D, CRNA   Fluids:  See anesthesia record  Estimated blood loss:  Minimal  Specimens:  None  Cultures:  Right renal pelvis culture and gram stain  Drains:  16Fr Coude, 10cc balloon  Implant(s): 6 Fr x 26 cm double J ureteral stent, RIGHT ureter.  Complications:  None  Indications: 56 y.o. year-old male with history of prostate cancer s/p RALP 07/02/16 by Dr. Alinda Money who presented with acute sepsis and findings of an obstructing right ureteral stone. He was previously seen in pre-op holding where he provided written informed consent for the procedure after discussion of the risks, benefits, and alternatives. All questions answered. He is eager to proceed.  Findings:  S/p radical prostatectomy with intact and healthy appearing urethral anastomosis. Successful right ureteral stent placement. Purulent urine draining upon placement.   Description:  The patient was correctly identified in the preop holding area where written informed consent as well potential risk and complication reviewed. He agreed. They were brought back to the operative suite where a preinduction timeout was performed. Once correct information was verified, anesthesia was induced  via endotracheal tube. They were then gently placed into dorsal lithotomy position with SCDs in place for VTE prophylaxis. They were prepped and draped in the usual sterile fashion. He had already been given appropriate antibiotics with Cefepime.  A second timeout was then performed.   We gently inserted a 21F rigid cystoscope per urethra with copious lubrication and normal saline irrigation running. This demonstrated findings as described above. We advanced an open ended ureteral catheter and Sensor wire past the stone into the right renal pelvis under fluoroscopic guidance. We removed the wire. Dark purulent urine drained which was collected for gram stain and culture. The wire was replaced and catheter removed. We advanced a 6 Fr x 26 cm double J type ureteral stent without string over the wire and up to the kidney again under fluoroscopic guidance. Removal of the wire demonstrated appropriate curls proximally and distally. The cystoscope was removed. We advanced a 16 Fr Coude catheter into the bladder without resistance. The balloon was inflated with 10cc water. The patient was awoken from anesthesia and taken to PACU in stable condition.   Post Op Plan:   1. Return to inpatient bed under care of medicine team with urology following.  2. Foley catheter at least overnight until afebrile and stable.  3. Continue targeted antibiotic therapy. Follow-up new urine gram stain and culture; broaden antibiotics prn.  4. Patient will require ureteral stone extraction to be set up with Dr. Alinda Money after discharge.   Attestation:  Dr. Jeffie Pollock was present for the entire procedure.    Joesph Fillers. Jonny Ruiz, [facility]

## 2016-08-10 NOTE — Progress Notes (Signed)
Informed CT patient finished drinking contrast

## 2016-08-10 NOTE — Progress Notes (Signed)
Temp elevated at 101.9, shivering - Tylenol 650mg  po given - started drinking isovue - MD made aware of temp

## 2016-08-10 NOTE — Progress Notes (Signed)
PROGRESS NOTE    Xavier Goodwin   DEY:[id number]  DOB: 1960/04/24  DOA: 08/09/2016 PCP: Alroy Dust, L.Marlou Sa, MD   Brief Narrative:  Xavier Goodwin is a 56 y.o. male with medical history significant of hyperlipidemia, tobacco abuse, prostate cancer status post recent robotic assisted radical prostatectomy about 6 weeks ago by Dr. Alinda Money.  He developed severe abdominal pain (woke him up) on Sat AM. It resolved with some pain medications but he subsequently vomited 2 x and then started having rigors. He has been having dysuria as well.  Tem 100.1 in ER, HR 120, hypotensive 84/50, UA + for UTI.   Subjective: abdominal pan nearly resolved. No vomiting or further rigors. Dysuria has resolved. Wifes state he had 2 watery stools last night. Urinating quite a lot but wife states it is very dark urine. He is otherwise feeling better.  Assessment & Plan:   Principal Problem:   Severe Sepsis secondary to UTI   - Bacteremia due to Enterobacter species per BCID- UA still pending - fever documented at 102.7, tachycardia, lactic acidosis better, WBC rising- symptoms improving - cont Cefepime and await further culture results  Active Problems:    AKI (acute kidney injury)  - baseline Cr 0.86-1 - currently 1.94- likely ATN from AKI- follow - mild right sided Hydronephrosis    Prostate cancer s/p radical prostatectomy   - have notified Dr Jeffie Pollock who will consult- he is recommended a CT abd/pelvis with contrast to r/o absecss  Hyperbilirubinemia - ? Due to sepsis- other LFTs stable- follow  Acute anemia and thrombocytopenia - baseline Hb 13-14 - now 11.1 baseline platelets 150-200- now 105 - may be dilutional- received at least 3 L of fluid so far per I and O - follow    Hypokalemia - replaced  HLD - statin  DVT prophylaxis: Heparin Code Status: Full code Family Communication: wife Disposition Plan: home when stable Consultants:   none Procedures:   none Antimicrobials:    Anti-infectives    Start     Dose/Rate Route Frequency Ordered Stop   08/09/16 1800  ceFEPIme (MAXIPIME) 2 g in dextrose 5 % 50 mL IVPB     2 g 100 mL/hr over 30 Minutes Intravenous Every 12 hours 08/09/16 0550     08/09/16 0530  ceFEPIme (MAXIPIME) 2 g in dextrose 5 % 50 mL IVPB     2 g 100 mL/hr over 30 Minutes Intravenous  Once 08/09/16 0520 08/09/16 0629       Objective: Vitals:   08/09/16 2141 08/09/16 2300 08/10/16 0554 08/10/16 0800  BP: 96/74  (!) 99/58 97/60  Pulse: (!) 112  82 90  Resp: 18  18   Temp: (!) 101.8 F (38.8 C) 98.6 F (37 C) 98.5 F (36.9 C) 99.2 F (37.3 C)  TempSrc: Oral Oral Oral Oral  SpO2: 100%  92%   Weight:      Height:        Intake/Output Summary (Last 24 hours) at 08/10/16 1041 Last data filed at 08/10/16 0959  Gross per 24 hour  Intake             1440 ml  Output                1 ml  Net             1439 ml   Filed Weights   08/09/16 0343 08/09/16 0543  Weight: 106.6 kg (235 lb) 106.6 kg (235 lb)    Examination:  General exam: Appears comfortable  HEENT: PERRLA, oral mucosa moist, no sclera icterus or thrush Respiratory system: Clear to auscultation. Respiratory effort normal. Cardiovascular system: S1 & S2 heard, RRR.  No murmurs  Gastrointestinal system: Abdomen soft,  tender in LLQ and suprapubic area, nondistended. Normal bowel sound. No organomegaly Central nervous system: Alert and oriented. No focal neurological deficits. Extremities: No cyanosis, clubbing or edema Skin: No rashes or ulcers Psychiatry:  Mood & affect appropriate.     Data Reviewed: I have personally reviewed following labs and imaging studies  CBC:  Recent Labs Lab 08/09/16 0354 08/10/16 0542  WBC 11.0* 17.3*  NEUTROABS 10.0*  --   HGB 13.3 11.1*  HCT 38.0* 30.7*  MCV 94.3 93.3  PLT 170 710*   Basic Metabolic Panel:  Recent Labs Lab 08/09/16 0354 08/10/16 0542  NA 137 140  K 3.2* 4.1  CL 108 110  CO2 19* 23  GLUCOSE 193* 113*   BUN 14 19  CREATININE 1.76* 1.94*  CALCIUM 9.1 8.2*   GFR: Estimated Creatinine Clearance: 53.8 mL/min (A) (by C-G formula based on SCr of 1.94 mg/dL (H)). Liver Function Tests:  Recent Labs Lab 08/09/16 0354 08/10/16 0542  AST 31 27  ALT 25 24  ALKPHOS 74 65  BILITOT 1.9* 2.7*  PROT 7.1 6.0*  ALBUMIN 3.5 2.7*   No results for input(s): LIPASE, AMYLASE in the last 168 hours. No results for input(s): AMMONIA in the last 168 hours. Coagulation Profile:  Recent Labs Lab 08/09/16 0354  INR 1.26   Cardiac Enzymes: No results for input(s): CKTOTAL, CKMB, CKMBINDEX, TROPONINI in the last 168 hours. BNP (last 3 results) No results for input(s): PROBNP in the last 8760 hours. HbA1C: No results for input(s): HGBA1C in the last 72 hours. CBG: No results for input(s): GLUCAP in the last 168 hours. Lipid Profile: No results for input(s): CHOL, HDL, LDLCALC, TRIG, CHOLHDL, LDLDIRECT in the last 72 hours. Thyroid Function Tests: No results for input(s): TSH, T4TOTAL, FREET4, T3FREE, THYROIDAB in the last 72 hours. Anemia Panel: No results for input(s): VITAMINB12, FOLATE, FERRITIN, TIBC, IRON, RETICCTPCT in the last 72 hours. Urine analysis:    Component Value Date/Time   COLORURINE YELLOW 08/09/2016 0354   APPEARANCEUR CLOUDY (A) 08/09/2016 0354   LABSPEC 1.015 08/09/2016 0354   PHURINE 5.0 08/09/2016 0354   GLUCOSEU NEGATIVE 08/09/2016 0354   HGBUR MODERATE (A) 08/09/2016 0354   BILIRUBINUR NEGATIVE 08/09/2016 0354   KETONESUR NEGATIVE 08/09/2016 0354   PROTEINUR 30 (A) 08/09/2016 0354   NITRITE NEGATIVE 08/09/2016 0354   LEUKOCYTESUR LARGE (A) 08/09/2016 0354   Sepsis Labs: @LABRCNTIP (procalcitonin:4,lacticidven:4) ) Recent Results (from the past 240 hour(s))  Culture, blood (Routine x 2)     Status: None (Preliminary result)   Collection Time: 08/09/16  3:54 AM  Result Value Ref Range Status   Specimen Description BLOOD LEFT ANTECUBITAL  Final   Special  Requests IN PEDIATRIC BOTTLE BCAV  Final   Culture  Setup Time   Final    IN PEDIATRIC BOTTLE GRAM NEGATIVE RODS CRITICAL VALUE NOTED.  VALUE IS CONSISTENT WITH PREVIOUSLY REPORTED AND CALLED VALUE.    Culture   Final    GRAM NEGATIVE RODS CULTURE REINCUBATED FOR BETTER GROWTH Performed at [facility], [place] [address] [place], [place] [address]    Report Status PENDING  Incomplete  Culture, blood (Routine x 2)     Status: None (Preliminary result)   Collection Time: 08/09/16  3:59 AM  Result  Value Ref Range Status   Specimen Description BLOOD RIGHT ANTECUBITAL  Final   Special Requests BOTTLES DRAWN AEROBIC AND ANAEROBIC BCAV  Final   Culture  Setup Time   Final    IN BOTH AEROBIC AND ANAEROBIC BOTTLES GRAM NEGATIVE RODS CRITICAL RESULT CALLED TO, READ BACK BY AND VERIFIED WITH: TO BGREEN(PHARMd) BY TCLEVELAND 08/09/2016 AT 11:51PM    Culture   Final    GRAM NEGATIVE RODS CULTURE REINCUBATED FOR BETTER GROWTH Performed at [facility], [place] [address] [place], [place] [address]    Report Status PENDING  Incomplete  Blood Culture ID Panel (Reflexed)     Status: Abnormal   Collection Time: 08/09/16  3:59 AM  Result Value Ref Range Status   Enterococcus species NOT DETECTED NOT DETECTED Final   Listeria monocytogenes NOT DETECTED NOT DETECTED Final   Staphylococcus species NOT DETECTED NOT DETECTED Final   Staphylococcus aureus NOT DETECTED NOT DETECTED Final   Streptococcus species NOT DETECTED NOT DETECTED Final   Streptococcus agalactiae NOT DETECTED NOT DETECTED Final   Streptococcus pneumoniae NOT DETECTED NOT DETECTED Final   Streptococcus pyogenes NOT DETECTED NOT DETECTED Final   Acinetobacter baumannii NOT DETECTED NOT DETECTED Final   Enterobacteriaceae species DETECTED (A) NOT DETECTED Final    Comment: Enterobacteriaceae represent a large family of gram-negative bacteria, not a single organism. CRITICAL RESULT CALLED TO, READ BACK BY AND VERIFIED  WITH: BGREEN(PHARMd) BY TCLEVELAND 08/09/2016 AT 11:54PM    Enterobacter cloacae complex DETECTED (A) NOT DETECTED Final    Comment: CRITICAL RESULT CALLED TO, READ BACK BY AND VERIFIED WITH: TO BGREEN(PHARMd)BY TCLEVELAND 08/09/2016 AT 11:54PM    Escherichia coli NOT DETECTED NOT DETECTED Final   Klebsiella oxytoca NOT DETECTED NOT DETECTED Final   Klebsiella pneumoniae NOT DETECTED NOT DETECTED Final   Proteus species NOT DETECTED NOT DETECTED Final   Serratia marcescens NOT DETECTED NOT DETECTED Final   Carbapenem resistance NOT DETECTED NOT DETECTED Final   Haemophilus influenzae NOT DETECTED NOT DETECTED Final   Neisseria meningitidis NOT DETECTED NOT DETECTED Final   Pseudomonas aeruginosa NOT DETECTED NOT DETECTED Final   Candida albicans NOT DETECTED NOT DETECTED Final   Candida glabrata NOT DETECTED NOT DETECTED Final   Candida krusei NOT DETECTED NOT DETECTED Final   Candida parapsilosis NOT DETECTED NOT DETECTED Final   Candida tropicalis NOT DETECTED NOT DETECTED Final    Comment: Performed at [facility], [place]. [address], [place], [place] [address]         Radiology Studies: Dg Chest 2 View  Result Date: 08/09/2016 CLINICAL DATA:  Mid abdominal pain and vomiting since 10:00 a.m. EXAM: CHEST  2 VIEW COMPARISON:  06/29/2016 FINDINGS: Minimal linear scarring or atelectasis in the left lung. The right lung is clear. No pleural effusions. Normal hilar, mediastinal and cardiac contours. IMPRESSION: Minimal linear scarring or atelectasis in the left base. Otherwise unremarkable. Electronically Signed   By: Andreas Newport M.D.   On: 08/09/2016 04:47   US Renal  Result Date: 08/09/2016 CLINICAL DATA:  Renal failure and prostate cancer EXAM: RENAL / URINARY TRACT ULTRASOUND COMPLETE COMPARISON:  None. FINDINGS: Right Kidney: Length: 13 cm. Mild hydronephrosis. No evidence of mass or collection. Left Kidney: Length: 14 cm. No comparison indeterminate this is normal size for  this patient. Echogenicity within normal limits. No mass or hydronephrosis visualized. Bladder: Moderate distension which may be physiologic. No superimposed focal wall thickening or debris. IMPRESSION: Mild hydronephrosis on the right.  No  left hydronephrosis. Electronically Signed   By: Monte Fantasia M.D.   On: 08/09/2016 15:22      Scheduled Meds: . aspirin EC  81 mg Oral Daily  . famotidine  20 mg Oral BID  . heparin  5,000 Units Subcutaneous Q8H  . nicotine  21 mg Transdermal Daily  . rosuvastatin  20 mg Oral q1800   Continuous Infusions: . sodium chloride 100 mL/hr at 08/10/16 0352  . ceFEPime (MAXIPIME) IV Stopped (08/10/16 0642)     LOS: 1 day    Time spent in minutes: 35    Debbe Odea, MD [facility] Pager: [email or web address] Password [email or web address] 08/10/2016, 10:41 AM

## 2016-08-10 NOTE — Progress Notes (Signed)
Starting to drink #2 of isovue, shivering has subsided, informed pt & his wife Toradol is not a good choice due to renal function at this time.

## 2016-08-10 NOTE — Transfer of Care (Signed)
Immediate Anesthesia Transfer of Care Note  Patient: Xavier Goodwin  Procedure(s) Performed: Procedure(s): CYSTOSCOPY WITH RETROGRADE PYELOGRAM RIGHT URETERAL STENT PLACEMENT (Right)  Patient Location: PACU  Anesthesia Type:General  Level of Consciousness:  sedated, patient cooperative and responds to stimulation  Airway & Oxygen Therapy:Patient Spontanous Breathing and Patient connected to face mask oxgen  Post-op Assessment:  Report given to PACU RN and Post -op Vital signs reviewed and stable  Post vital signs:  Reviewed and stable  Last Vitals:  Vitals:   08/10/16 1500 08/10/16 1628  BP:  (!) 91/59  Pulse:  100  Resp:    Temp: (!) 39.5 C (!) 86.7 C    Complications: No apparent anesthesia complications

## 2016-08-10 NOTE — Consult Note (Signed)
New Consult Note  Requesting Physician: Debbe Odea, MD  Service Requesting Consult: Medicine  Urology Consult Attending: Jeffie Pollock Reason for Consult:  Sepsis, hydro  Subjective: Xavier Goodwin is seen in consultation for reasons noted above at the request of Debbe Odea, MD on the medicine service. This is a 56 year old patient with a history of HLD, prostate cancer s/p RALP on 07/02/16 by Dr. Alinda Money, who presents with sepsis.   He presented with complaints of fevers/chills, abdominal pain, and nausea/vomiting. He reports dysuria since his foley catheter was removed. However, 2 days ago, he began having intermittent fevers with nausea and vomiting. Also had lower abdominal pain. Pain improved with vomiting. Yesterday however he had severe chills prompting him to go to the ED.  He reports poor PO intake. He reports normal bowel movements. He reports normal voiding without straining or gross hematuria. He denies flank pain. He does have a history of kidney stones.    Past Medical History: Past Medical History:  Diagnosis Date  . Cancer [facility])    prostate cancer  . Chest pain    none recently since "i stopped drinking the "big gulp" of caffeine   . History of kidney stones   . Hypercholesteremia     Past Surgical History:  Past Surgical History:  Procedure Laterality Date  . CARDIAC CATHETERIZATION N/A 11/25/2015   Procedure: Left Heart Cath and Coronary Angiography;  Surgeon: Josue Hector, MD;  Location: [place] [facility];  Service: Cardiovascular;  Laterality: N/A;  . NO PAST SURGERIES    . PELVIC LYMPH NODE DISSECTION Bilateral 07/02/2016   Procedure: BILATERAL PELVIC LYMPH NODE DISSECTION;  Surgeon: Raynelle Bring, MD;  Location: [facility];  Service: Urology;  Laterality: Bilateral;  . ROBOT ASSISTED LAPAROSCOPIC RADICAL PROSTATECTOMY N/A 07/02/2016   Procedure: XI ROBOTIC ASSISTED LAPAROSCOPIC RADICAL PROSTATECTOMY LEVEL 2;  Surgeon: Raynelle Bring, MD;  Location: [facility];   Service: Urology;  Laterality: N/A;    Medication: Current Facility-Administered Medications  Medication Dose Route Frequency Provider Last Rate Last Dose  . 0.9 %  sodium chloride infusion   Intravenous Continuous Caren Griffins, MD 100 mL/hr at 08/10/16 1349 100 mL/hr at 08/10/16 1349  . acetaminophen (TYLENOL) tablet 650 mg  650 mg Oral Q6H PRN Caren Griffins, MD   650 mg at 08/10/16 1309   Or  . acetaminophen (TYLENOL) suppository 650 mg  650 mg Rectal Q6H PRN Caren Griffins, MD      . acetaminophen (TYLENOL) tablet 1,000 mg  1,000 mg Oral Q4H PRN Debbe Odea, MD      . aspirin EC tablet 81 mg  81 mg Oral Daily Caren Griffins, MD   81 mg at 08/10/16 1117  . ceFEPIme (MAXIPIME) 2 g in dextrose 5 % 50 mL IVPB  2 g Intravenous Q12H Dorrene German, [facility]   Stopped at 08/10/16 2774  . famotidine (PEPCID) tablet 20 mg  20 mg Oral BID Reubin Milan, MD   20 mg at 08/10/16 1118  . heparin injection 5,000 Units  5,000 Units Subcutaneous Q8H Caren Griffins, MD   5,000 Units at 08/10/16 1459  . iopamidol (ISOVUE-300) 61 % injection 15 mL  15 mL Oral Once PRN Debbe Odea, MD      . nicotine (NICODERM CQ - dosed in mg/24 hours) patch 21 mg  21 mg Transdermal Daily Caren Griffins, MD   21 mg at 08/10/16 1000  . ondansetron (ZOFRAN) injection 4 mg  4  mg Intravenous Q6H PRN Caren Griffins, MD   4 mg at 08/09/16 1816  . rosuvastatin (CRESTOR) tablet 20 mg  20 mg Oral q1800 Caren Griffins, MD   20 mg at 08/09/16 1816    Allergies: No Known Allergies  Social History: Social History  Substance Use Topics  . Smoking status: Current Every Day Smoker    Packs/day: 1.00    Types: Cigarettes  . Smokeless tobacco: Never Used     Comment: greater than 20 years   . Alcohol use No    Family History Family History  Problem Relation Age of Onset  . Hypertension Father   . Heart attack Father   . Diabetes Mother   . Cancer Mother   . Lupus Sister     Review of  Systems 10 systems were reviewed and are negative except as noted specifically in the HPI.  Objective: Vital signs in last 24 hours: BP (!) 91/59 (BP Location: Left Arm)   Pulse 100   Temp (!) 101 F (38.3 C) (Oral)   Resp 18   Ht 6\' 1"  (1.854 m)   Wt 106.6 kg (235 lb)   SpO2 97%   BMI 31.00 kg/m   Intake/Output last 3 shifts: I/O last 3 completed shifts: In: 3200 [I.V.:2100; IV Piggyback:1100] Out: -   Physical Exam General: Mild distress, febrile HEENT: [place]/AT, EOMI Pulmonary: Normal work of breathing on RA Cardiovascular: Tachycardic Abdomen: soft, NTTP, nondistended GU: No foley, No CVA tenderness Extremities: warm and well perfused Neuro: Appropriate, no focal neurological deficits  Most Recent Labs: Lab Results  Component Value Date   WBC 17.3 (H) 08/10/2016   HGB 11.1 (L) 08/10/2016   HCT 30.7 (L) 08/10/2016   PLT 105 (L) 08/10/2016    Lab Results  Component Value Date   NA 139 08/10/2016   K 3.7 08/10/2016   CL 110 08/10/2016   CO2 23 08/10/2016   BUN 20 08/10/2016   CREATININE 1.91 (H) 08/10/2016   CALCIUM 8.3 (L) 08/10/2016    Lab Results  Component Value Date   ALKPHOS 69 08/10/2016   BILITOT 2.5 (H) 08/10/2016   PROT 6.4 (L) 08/10/2016   ALBUMIN 2.9 (L) 08/10/2016   ALT 25 08/10/2016   AST 26 08/10/2016    Lab Results  Component Value Date   INR 1.26 08/09/2016     Urine Culture: Pending  Blood culture:  Enterobacter   IMAGING: Dg Chest 2 View  Result Date: 08/09/2016 CLINICAL DATA:  Mid abdominal pain and vomiting since 10:00 a.m. EXAM: CHEST  2 VIEW COMPARISON:  06/29/2016 FINDINGS: Minimal linear scarring or atelectasis in the left lung. The right lung is clear. No pleural effusions. Normal hilar, mediastinal and cardiac contours. IMPRESSION: Minimal linear scarring or atelectasis in the left base. Otherwise unremarkable. Electronically Signed   By: Andreas Newport M.D.   On: 08/09/2016 04:47   Ct Abdomen Pelvis W  Contrast  Result Date: 08/10/2016 CLINICAL DATA:  Right hydronephrosis on ultrasound. Prostate cancer. Prostatectomy 6 weeks ago. Fever EXAM: CT ABDOMEN AND PELVIS WITH CONTRAST TECHNIQUE: Multidetector CT imaging of the abdomen and pelvis was performed using the standard protocol following bolus administration of intravenous contrast. CONTRAST:  46mL ISOVUE-300 IOPAMIDOL (ISOVUE-300) INJECTION 61% COMPARISON:  Ultrasound abdomen 08/09/2016 FINDINGS: Lower chest: Lung bases clear Hepatobiliary: Large calcified gallstone in the neck of the gallbladder measures 22 x 14 mm. No gallbladder wall thickening or biliary dilatation. Negative liver. Pancreas: Negative Spleen: Negative Adrenals/Urinary Tract: Mild to  moderate hydronephrosis on the right. Obstructing stone in the proximal right ureter measures 5 x 7 mm. Perinephric edema is present on the right. Additional nonobstructing right upper pole stone measures approximately 6 x 12 mm. No left renal calculi. No left hydronephrosis. Bladder wall diffusely thickened. This may be due to recent surgery or cystitis. Postop prostatectomy. No fluid collection or hematoma. Stomach/Bowel: Negative for bowel obstruction. No bowel mass or edema. Normal appendix Vascular/Lymphatic: Mild atherosclerotic disease Reproductive: Prostatectomy without complications Other: Negative for free fluid. Musculoskeletal: Degenerative anterolisthesis L5-S1. No acute skeletal abnormality. IMPRESSION: 5 x 7 mm obstructing stone proximal right ureter. Additional 6 x 12 mm right upper pole calculus. Large calcified gallstone without gallbladder wall thickening Bladder wall thickening may be due to recent surgery or cystitis. No hematoma in the pelvis. Electronically Signed   By: Franchot Gallo M.D.   On: 08/10/2016 16:53   US Renal  Result Date: 08/09/2016 CLINICAL DATA:  Renal failure and prostate cancer EXAM: RENAL / URINARY TRACT ULTRASOUND COMPLETE COMPARISON:  None. FINDINGS: Right Kidney:  Length: 13 cm. Mild hydronephrosis. No evidence of mass or collection. Left Kidney: Length: 14 cm. No comparison indeterminate this is normal size for this patient. Echogenicity within normal limits. No mass or hydronephrosis visualized. Bladder: Moderate distension which may be physiologic. No superimposed focal wall thickening or debris. IMPRESSION: Mild hydronephrosis on the right.  No left hydronephrosis. Electronically Signed   By: Monte Fantasia M.D.   On: 08/09/2016 15:22     Assessment: Patient is a 56 y.o. male with history of prostate cancer s/p RALP 07/02/16 presenting with acute sepsis. Blood cultures positive for Enterobacter with positive urinalysis, pending culture. Renal ultrasound on admission with mild right hydro.   During consult, patient was sent for CT scan which demonstrated a proximal right obstructing stone.   In the setting of obstruction and sepsis, emergent ureteral stent is warranted.   Recommendations: 1. Posted for emergent cysto with right ureteral stent placement. 2. Follow cultures and treat sepsis as you are. We will try to send culture proximal to stone in OR.  3. Patient will eventually require ureteral stone extraction, to be coordinated after discharge.  Discussed with Dr. Jeffie Pollock.  Thank you for this consult. Please do not hesitate to contact us with any further questions/concerns.  Lorayne Bender, MD PGY4 Urology Resident  I have seen and examined the patient and reviewed the pertinent labs and images.

## 2016-08-10 NOTE — Progress Notes (Signed)
PHARMACY - PHYSICIAN COMMUNICATION CRITICAL VALUE ALERT - BLOOD CULTURE IDENTIFICATION (BCID)  Results for orders placed or performed during the hospital encounter of 08/09/16  Blood Culture ID Panel (Reflexed) (Collected: 08/09/2016  3:59 AM)  Result Value Ref Range   Enterococcus species NOT DETECTED NOT DETECTED   Listeria monocytogenes NOT DETECTED NOT DETECTED   Staphylococcus species NOT DETECTED NOT DETECTED   Staphylococcus aureus NOT DETECTED NOT DETECTED   Streptococcus species NOT DETECTED NOT DETECTED   Streptococcus agalactiae NOT DETECTED NOT DETECTED   Streptococcus pneumoniae NOT DETECTED NOT DETECTED   Streptococcus pyogenes NOT DETECTED NOT DETECTED   Acinetobacter baumannii NOT DETECTED NOT DETECTED   Enterobacteriaceae species DETECTED (A) NOT DETECTED   Enterobacter cloacae complex DETECTED (A) NOT DETECTED   Escherichia coli NOT DETECTED NOT DETECTED   Klebsiella oxytoca NOT DETECTED NOT DETECTED   Klebsiella pneumoniae NOT DETECTED NOT DETECTED   Proteus species NOT DETECTED NOT DETECTED   Serratia marcescens NOT DETECTED NOT DETECTED   Carbapenem resistance NOT DETECTED NOT DETECTED   Haemophilus influenzae NOT DETECTED NOT DETECTED   Neisseria meningitidis NOT DETECTED NOT DETECTED   Pseudomonas aeruginosa NOT DETECTED NOT DETECTED   Candida albicans NOT DETECTED NOT DETECTED   Candida glabrata NOT DETECTED NOT DETECTED   Candida krusei NOT DETECTED NOT DETECTED   Candida parapsilosis NOT DETECTED NOT DETECTED   Candida tropicalis NOT DETECTED NOT DETECTED    Name of physician (or Provider) Contacted: D Oritz  Changes to prescribed antibiotics required: On Cefepime- covered  Dorrene German 08/10/2016  12:02 AM

## 2016-08-10 NOTE — Anesthesia Procedure Notes (Signed)
Procedure Name: Intubation Date/Time: 08/10/2016 6:11 PM Performed by: Noralyn Pick D Pre-anesthesia Checklist: Patient identified, Emergency Drugs available, Suction available and Patient being monitored Patient Re-evaluated:Patient Re-evaluated prior to inductionOxygen Delivery Method: Circle system utilized Preoxygenation: Pre-oxygenation with 100% oxygen Intubation Type: IV induction, Rapid sequence and Cricoid Pressure applied Laryngoscope Size: Mac and 4 Grade View: Grade I Tube type: Oral Number of attempts: 1 Airway Equipment and Method: Stylet and Oral airway Placement Confirmation: ETT inserted through vocal cords under direct vision,  positive ETCO2 and breath sounds checked- equal and bilateral Tube secured with: Tape Dental Injury: Teeth and Oropharynx as per pre-operative assessment

## 2016-08-11 ENCOUNTER — Encounter (HOSPITAL_COMMUNITY): Payer: Self-pay | Admitting: Urology

## 2016-08-11 DIAGNOSIS — N132 Hydronephrosis with renal and ureteral calculous obstruction: Secondary | ICD-10-CM

## 2016-08-11 DIAGNOSIS — K8020 Calculus of gallbladder without cholecystitis without obstruction: Secondary | ICD-10-CM

## 2016-08-11 LAB — CBC
HEMATOCRIT: 27.4 % — AB (ref 39.0–52.0)
Hemoglobin: 10 g/dL — ABNORMAL LOW (ref 13.0–17.0)
MCH: 33.4 pg (ref 26.0–34.0)
MCHC: 36.5 g/dL — ABNORMAL HIGH (ref 30.0–36.0)
MCV: 91.6 fL (ref 78.0–100.0)
Platelets: 94 10*3/uL — ABNORMAL LOW (ref 150–400)
RBC: 2.99 MIL/uL — AB (ref 4.22–5.81)
RDW: 13.7 % (ref 11.5–15.5)
WBC: 12.3 10*3/uL — AB (ref 4.0–10.5)

## 2016-08-11 LAB — COMPREHENSIVE METABOLIC PANEL
ALK PHOS: 75 U/L (ref 38–126)
ALT: 19 U/L (ref 17–63)
AST: 21 U/L (ref 15–41)
Albumin: 2.5 g/dL — ABNORMAL LOW (ref 3.5–5.0)
Anion gap: 6 (ref 5–15)
BILIRUBIN TOTAL: 2.2 mg/dL — AB (ref 0.3–1.2)
BUN: 16 mg/dL (ref 6–20)
CALCIUM: 8.2 mg/dL — AB (ref 8.9–10.3)
CO2: 22 mmol/L (ref 22–32)
Chloride: 111 mmol/L (ref 101–111)
Creatinine, Ser: 1.32 mg/dL — ABNORMAL HIGH (ref 0.61–1.24)
GFR calc Af Amer: >60 mL/min (ref >60)
GFR, EST NON AFRICAN AMERICAN: 58 mL/min — AB (ref >60)
Glucose, Bld: 131 mg/dL — ABNORMAL HIGH (ref 65–99)
Potassium: 3.5 mmol/L (ref 3.5–5.1)
SODIUM: 139 mmol/L (ref 135–145)
TOTAL PROTEIN: 5.8 g/dL — AB (ref 6.5–8.1)

## 2016-08-11 LAB — URINE CULTURE

## 2016-08-11 MED ORDER — DEXTROSE 5 % IV SOLN
2.0000 g | INTRAVENOUS | Status: DC
  Administered 2016-08-11 – 2016-08-12 (×2): 2 g via INTRAVENOUS
  Filled 2016-08-11 (×2): qty 2

## 2016-08-11 NOTE — Progress Notes (Signed)
PROGRESS NOTE    Virginio Isidore Quinones   XTK:[id number]  DOB: 03/23/1961  DOA: 08/09/2016 PCP: Alroy Dust, L.Marlou Sa, MD   Brief Narrative:  JULIAS MOULD is a 56 y.o. male with medical history significant of hyperlipidemia, tobacco abuse, prostate cancer status post recent robotic assisted radical prostatectomy about 6 weeks ago by Dr. Alinda Money.  He developed severe abdominal pain (woke him up) on Sat AM. It resolved with some pain medications but he subsequently vomited 2 x and then started having rigors. He has been having dysuria as well.  Temp 100.1 in ER, HR 120, hypotensive 84/50, UA + for UTI.   Subjective: No chills or fever, no dysuria or abdominal pain. Continues to have loose stools. No other complaints.   Assessment & Plan:   Principal Problem:   Severe Sepsis secondary to E coli UTI and bacteremia in relation of nephrolithiasis - fever documented at 102.7, tachycardia, lactic acidosis, leukocytosis - renal ultrasone 5/6 suggestive of mild right Hydronephrosis - Ct scan 5/7 revealed R  nephrolithiasis - right ureteral stent placed on 5/7- currently has foley- hopefully can remove it tomorrow as long as he is stable- outpt f/u for stone extraction with Dr Alinda Money - labs, fevers and symptoms improving -has been on Cefepime-  culture sensitivities reviewed- given a dose of Ceftriaxone in place of Cefepime today- can switch to either oral cephalosporin or Cipro tomorrow AM  Active Problems:    AKI (acute kidney injury)  - baseline Cr 0.86-1 - steadily improving- likely due to ureter obstruction, UTI and ATN    - improving- drinking well- stop IVF today    Prostate cancer s/p radical prostatectomy   - per Urology  Hyperbilirubinemia- cholelithiasis - ? Due to sepsis- other LFTs stable-CT abd pelvis show a stone in the neck of the gallbladder with is not noted to be obstructing- no RUQ pain or nausea-  follow  Acute anemia   - baseline Hb 13-14 - now 10 - may be dilutional-  received at least almost 4.5 L of fluid so far per I and O - follow    Acute Thrombocytopenia - baseline platelets 150-200- now 94 - likely due to gram negative sepsis in addition to being dilutional  Hypokalemia - replaced  HLD - statin  DVT prophylaxis: Heparin Code Status: Full code Family Communication: wife Disposition Plan: home when stable Consultants:   none Procedures:   none Antimicrobials:  Anti-infectives    Start     Dose/Rate Route Frequency Ordered Stop   08/11/16 1000  cefTRIAXone (ROCEPHIN) 2 g in dextrose 5 % 50 mL IVPB     2 g 100 mL/hr over 30 Minutes Intravenous Every 24 hours 08/11/16 0922     08/09/16 1800  ceFEPIme (MAXIPIME) 2 g in dextrose 5 % 50 mL IVPB  Status:  Discontinued     2 g 100 mL/hr over 30 Minutes Intravenous Every 12 hours 08/09/16 0550 08/11/16 0922   08/09/16 0530  ceFEPIme (MAXIPIME) 2 g in dextrose 5 % 50 mL IVPB     2 g 100 mL/hr over 30 Minutes Intravenous  Once 08/09/16 0520 08/09/16 0629       Objective: Vitals:   08/10/16 1927 08/10/16 1956 08/10/16 2158 08/11/16 0512  BP: (!) 85/55 (!) 83/58 97/67 107/77  Pulse: 83 88  78  Resp: (!) 24 (!) 24  20  Temp: 99.1 F (37.3 C) 98.9 F (37.2 C)  98.9 F (37.2 C)  TempSrc:  Oral  Oral  SpO2:  94% 97%  94%  Weight:      Height:        Intake/Output Summary (Last 24 hours) at 08/11/16 1414 Last data filed at 08/11/16 0614  Gross per 24 hour  Intake             1960 ml  Output              950 ml  Net             1010 ml   Filed Weights   08/09/16 0343 08/09/16 0543  Weight: 106.6 kg (235 lb) 106.6 kg (235 lb)    Examination: General exam: Appears comfortable  HEENT: PERRLA, oral mucosa moist, no sclera icterus or thrush Respiratory system: Clear to auscultation. Respiratory effort normal. Cardiovascular system: S1 & S2 heard, RRR.  No murmurs  Gastrointestinal system: Abdomen soft, no longer tender in LLQ and suprapubic area, nondistended. Normal bowel  sound. No organomegaly Central nervous system: Alert and oriented. No focal neurological deficits. Extremities: No cyanosis, clubbing or edema Skin: No rashes or ulcers Psychiatry:  Mood & affect appropriate.     Data Reviewed: I have personally reviewed following labs and imaging studies  CBC:  Recent Labs Lab 08/09/16 0354 08/10/16 0542 08/11/16 0656  WBC 11.0* 17.3* 12.3*  NEUTROABS 10.0*  --   --   HGB 13.3 11.1* 10.0*  HCT 38.0* 30.7* 27.4*  MCV 94.3 93.3 91.6  PLT 170 105* 94*   Basic Metabolic Panel:  Recent Labs Lab 08/09/16 0354 08/10/16 0542 08/10/16 1209 08/11/16 0656  NA 137 140 139 139  K 3.2* 4.1 3.7 3.5  CL 108 110 110 111  CO2 19* 23 23 22   GLUCOSE 193* 113* 107* 131*  BUN 14 19 20 16   CREATININE 1.76* 1.94* 1.91* 1.32*  CALCIUM 9.1 8.2* 8.3* 8.2*   GFR: Estimated Creatinine Clearance: 79.1 mL/min (A) (by C-G formula based on SCr of 1.32 mg/dL (H)). Liver Function Tests:  Recent Labs Lab 08/09/16 0354 08/10/16 0542 08/10/16 1209 08/11/16 0656  AST 31 27 26 21   ALT 25 24 25 19   ALKPHOS 74 65 69 75  BILITOT 1.9* 2.7* 2.5* 2.2*  PROT 7.1 6.0* 6.4* 5.8*  ALBUMIN 3.5 2.7* 2.9* 2.5*   No results for input(s): LIPASE, AMYLASE in the last 168 hours. No results for input(s): AMMONIA in the last 168 hours. Coagulation Profile:  Recent Labs Lab 08/09/16 0354  INR 1.26   Cardiac Enzymes: No results for input(s): CKTOTAL, CKMB, CKMBINDEX, TROPONINI in the last 168 hours. BNP (last 3 results) No results for input(s): PROBNP in the last 8760 hours. HbA1C: No results for input(s): HGBA1C in the last 72 hours. CBG: No results for input(s): GLUCAP in the last 168 hours. Lipid Profile: No results for input(s): CHOL, HDL, LDLCALC, TRIG, CHOLHDL, LDLDIRECT in the last 72 hours. Thyroid Function Tests: No results for input(s): TSH, T4TOTAL, FREET4, T3FREE, THYROIDAB in the last 72 hours. Anemia Panel: No results for input(s): VITAMINB12,  FOLATE, FERRITIN, TIBC, IRON, RETICCTPCT in the last 72 hours. Urine analysis:    Component Value Date/Time   COLORURINE YELLOW 08/09/2016 0354   APPEARANCEUR CLOUDY (A) 08/09/2016 0354   LABSPEC 1.015 08/09/2016 0354   PHURINE 5.0 08/09/2016 0354   GLUCOSEU NEGATIVE 08/09/2016 0354   HGBUR MODERATE (A) 08/09/2016 0354   BILIRUBINUR NEGATIVE 08/09/2016 0354   KETONESUR NEGATIVE 08/09/2016 0354   PROTEINUR 30 (A) 08/09/2016 0354   NITRITE NEGATIVE 08/09/2016 0354   LEUKOCYTESUR  LARGE (A) 08/09/2016 0354   Sepsis Labs: @LABRCNTIP (procalcitonin:4,lacticidven:4) ) Recent Results (from the past 240 hour(s))  Culture, blood (Routine x 2)     Status: None (Preliminary result)   Collection Time: 08/09/16  3:54 AM  Result Value Ref Range Status   Specimen Description BLOOD LEFT ANTECUBITAL  Final   Special Requests IN PEDIATRIC BOTTLE BCAV  Final   Culture  Setup Time   Final    IN PEDIATRIC BOTTLE GRAM NEGATIVE RODS CRITICAL VALUE NOTED.  VALUE IS CONSISTENT WITH PREVIOUSLY REPORTED AND CALLED VALUE.    Culture   Final    GRAM NEGATIVE RODS IDENTIFICATION AND SUSCEPTIBILITIES TO FOLLOW Performed at [facility], [place] [address], [place], [place] [address]    Report Status PENDING  Incomplete  Urine culture     Status: Abnormal   Collection Time: 08/09/16  3:54 AM  Result Value Ref Range Status   Specimen Description URINE, RANDOM  Final   Special Requests NONE  Final   Culture >=100,000 COLONIES/mL ENTEROBACTER CLOACAE (A)  Final   Report Status 08/11/2016 FINAL  Final   Organism ID, Bacteria ENTEROBACTER CLOACAE (A)  Final      Susceptibility   Enterobacter cloacae - MIC*    CEFAZOLIN >=64 RESISTANT Resistant     CEFTRIAXONE <=1 SENSITIVE Sensitive     CIPROFLOXACIN <=0.25 SENSITIVE Sensitive     GENTAMICIN <=1 SENSITIVE Sensitive     IMIPENEM <=0.25 SENSITIVE Sensitive     NITROFURANTOIN 64 INTERMEDIATE Intermediate     TRIMETH/SULFA <=20 SENSITIVE Sensitive      PIP/TAZO <=4 SENSITIVE Sensitive     * >=100,000 COLONIES/mL ENTEROBACTER CLOACAE  Culture, blood (Routine x 2)     Status: Abnormal (Preliminary result)   Collection Time: 08/09/16  3:59 AM  Result Value Ref Range Status   Specimen Description BLOOD RIGHT ANTECUBITAL  Final   Special Requests BOTTLES DRAWN AEROBIC AND ANAEROBIC BCAV  Final   Culture  Setup Time   Final    IN BOTH AEROBIC AND ANAEROBIC BOTTLES GRAM NEGATIVE RODS CRITICAL RESULT CALLED TO, READ BACK BY AND VERIFIED WITH: TO BGREEN(PHARMd) BY TCLEVELAND 08/09/2016 AT 11:51PM    Culture (A)  Final    ENTEROBACTER SPECIES SUSCEPTIBILITIES TO FOLLOW Performed at [facility], [place] [address] [place], [place] [address]    Report Status PENDING  Incomplete  Blood Culture ID Panel (Reflexed)     Status: Abnormal   Collection Time: 08/09/16  3:59 AM  Result Value Ref Range Status   Enterococcus species NOT DETECTED NOT DETECTED Final   Listeria monocytogenes NOT DETECTED NOT DETECTED Final   Staphylococcus species NOT DETECTED NOT DETECTED Final   Staphylococcus aureus NOT DETECTED NOT DETECTED Final   Streptococcus species NOT DETECTED NOT DETECTED Final   Streptococcus agalactiae NOT DETECTED NOT DETECTED Final   Streptococcus pneumoniae NOT DETECTED NOT DETECTED Final   Streptococcus pyogenes NOT DETECTED NOT DETECTED Final   Acinetobacter baumannii NOT DETECTED NOT DETECTED Final   Enterobacteriaceae species DETECTED (A) NOT DETECTED Final    Comment: Enterobacteriaceae represent a large family of gram-negative bacteria, not a single organism. CRITICAL RESULT CALLED TO, READ BACK BY AND VERIFIED WITH: BGREEN(PHARMd) BY TCLEVELAND 08/09/2016 AT 11:54PM    Enterobacter cloacae complex DETECTED (A) NOT DETECTED Final    Comment: CRITICAL RESULT CALLED TO, READ BACK BY AND VERIFIED WITH: TO BGREEN(PHARMd)BY TCLEVELAND 08/09/2016 AT 11:54PM    Escherichia coli NOT DETECTED NOT DETECTED Final   Klebsiella  oxytoca NOT  DETECTED NOT DETECTED Final   Klebsiella pneumoniae NOT DETECTED NOT DETECTED Final   Proteus species NOT DETECTED NOT DETECTED Final   Serratia marcescens NOT DETECTED NOT DETECTED Final   Carbapenem resistance NOT DETECTED NOT DETECTED Final   Haemophilus influenzae NOT DETECTED NOT DETECTED Final   Neisseria meningitidis NOT DETECTED NOT DETECTED Final   Pseudomonas aeruginosa NOT DETECTED NOT DETECTED Final   Candida albicans NOT DETECTED NOT DETECTED Final   Candida glabrata NOT DETECTED NOT DETECTED Final   Candida krusei NOT DETECTED NOT DETECTED Final   Candida parapsilosis NOT DETECTED NOT DETECTED Final   Candida tropicalis NOT DETECTED NOT DETECTED Final    Comment: Performed at [facility], [place] [address], [place], [place] [address]  Gram stain     Status: None   Collection Time: 08/10/16  6:29 PM  Result Value Ref Range Status   Specimen Description URINE, RANDOM  Final   Special Requests NONE  Final   Gram Stain   Final    WBC PRESENT,BOTH PMN AND MONONUCLEAR GRAM NEGATIVE RODS CYTOSPIN SMEAR Performed at [facility], [place] [address], [place], [place] [address]    Report Status 08/10/2016 FINAL  Final         Radiology Studies: Ct Abdomen Pelvis W Contrast  Result Date: 08/10/2016 CLINICAL DATA:  Right hydronephrosis on ultrasound. Prostate cancer. Prostatectomy 6 weeks ago. Fever EXAM: CT ABDOMEN AND PELVIS WITH CONTRAST TECHNIQUE: Multidetector CT imaging of the abdomen and pelvis was performed using the standard protocol following bolus administration of intravenous contrast. CONTRAST:  28mL ISOVUE-300 IOPAMIDOL (ISOVUE-300) INJECTION 61% COMPARISON:  Ultrasound abdomen 08/09/2016 FINDINGS: Lower chest: Lung bases clear Hepatobiliary: Large calcified gallstone in the neck of the gallbladder measures 22 x 14 mm. No gallbladder wall thickening or biliary dilatation. Negative liver. Pancreas: Negative Spleen: Negative Adrenals/Urinary Tract: Mild to  moderate hydronephrosis on the right. Obstructing stone in the proximal right ureter measures 5 x 7 mm. Perinephric edema is present on the right. Additional nonobstructing right upper pole stone measures approximately 6 x 12 mm. No left renal calculi. No left hydronephrosis. Bladder wall diffusely thickened. This may be due to recent surgery or cystitis. Postop prostatectomy. No fluid collection or hematoma. Stomach/Bowel: Negative for bowel obstruction. No bowel mass or edema. Normal appendix Vascular/Lymphatic: Mild atherosclerotic disease Reproductive: Prostatectomy without complications Other: Negative for free fluid. Musculoskeletal: Degenerative anterolisthesis L5-S1. No acute skeletal abnormality. IMPRESSION: 5 x 7 mm obstructing stone proximal right ureter. Additional 6 x 12 mm right upper pole calculus. Large calcified gallstone without gallbladder wall thickening Bladder wall thickening may be due to recent surgery or cystitis. No hematoma in the pelvis. Electronically Signed   By: Franchot Gallo M.D.   On: 08/10/2016 16:53   US Renal  Result Date: 08/09/2016 CLINICAL DATA:  Renal failure and prostate cancer EXAM: RENAL / URINARY TRACT ULTRASOUND COMPLETE COMPARISON:  None. FINDINGS: Right Kidney: Length: 13 cm. Mild hydronephrosis. No evidence of mass or collection. Left Kidney: Length: 14 cm. No comparison indeterminate this is normal size for this patient. Echogenicity within normal limits. No mass or hydronephrosis visualized. Bladder: Moderate distension which may be physiologic. No superimposed focal wall thickening or debris. IMPRESSION: Mild hydronephrosis on the right.  No left hydronephrosis. Electronically Signed   By: Monte Fantasia M.D.   On: 08/09/2016 15:22   Dg C-arm 1-60 Min-no Report  Result Date: 08/10/2016 Fluoroscopy was utilized by the requesting physician.  No radiographic  interpretation.      Scheduled Meds: . aspirin EC  81 mg Oral Daily  . nicotine  21 mg  Transdermal Daily  . rosuvastatin  20 mg Oral q1800   Continuous Infusions: . sodium chloride 50 mL/hr at 08/11/16 1228  . cefTRIAXone (ROCEPHIN)  IV Stopped (08/11/16 1110)     LOS: 2 days    Time spent in minutes: 35    Debbe Odea, MD [facility] Pager: [email or web address] Password [email or web address] 08/11/2016, 2:14 PM

## 2016-08-11 NOTE — Anesthesia Postprocedure Evaluation (Signed)
Anesthesia Post Note  Patient: Xavier Goodwin  Procedure(s) Performed: Procedure(s) (LRB): CYSTOSCOPY WITH RETROGRADE PYELOGRAM RIGHT URETERAL STENT PLACEMENT (Right)  Patient location during evaluation: PACU Anesthesia Type: General Level of consciousness: awake and alert Pain management: pain level controlled Vital Signs Assessment: post-procedure vital signs reviewed and stable Respiratory status: spontaneous breathing, nonlabored ventilation, respiratory function stable and patient connected to nasal cannula oxygen Cardiovascular status: blood pressure returned to baseline and stable Postop Assessment: no signs of nausea or vomiting Anesthetic complications: no       Last Vitals:  Vitals:   08/10/16 2158 08/11/16 0512  BP: 97/67 107/77  Pulse:  78  Resp:  20  Temp:  37.2 C    Last Pain:  Vitals:   08/11/16 0512  TempSrc: Oral  PainSc:                  Catalina Gravel

## 2016-08-11 NOTE — Progress Notes (Signed)
Patient ID: Xavier Goodwin, male   DOB: Dec 25, 1960, 56 y.o.   MRN: [id number]  1 Day Post-Op Subjective: Pt with history of prostate cancer s/p robotic prostatectomy about 6 weeks ago. Now presented with fever, UTI, and obstructing right ureteral stone.  S/P urgent right ureteral stent last night.  Now feeling better.  Objective: Vital signs in last 24 hours: Temp:  [98.3 F (36.8 C)-103.1 F (39.5 C)] 98.9 F (37.2 C) (05/08 0512) Pulse Rate:  [78-106] 78 (05/08 0512) Resp:  [19-25] 20 (05/08 0512) BP: (83-147)/(55-102) 107/77 (05/08 0512) SpO2:  [93 %-98 %] 94 % (05/08 0512)  Intake/Output from previous day: 05/07 0701 - 05/08 0700 In: 2200 [P.O.:240; I.V.:1860; IV Piggyback:100] Out: 952 [Urine:951; Stool:1] Intake/Output this shift: No intake/output data recorded.  Physical Exam:  General: Alert and oriented CV: RRR Lungs: Clear Abdomen: Soft, ND Incisions: Ext: NT, No erythema  Lab Results:  Recent Labs  08/09/16 0354 08/10/16 0542 08/11/16 0656  HGB 13.3 11.1* 10.0*  HCT 38.0* 30.7* 27.4*   CBC Latest Ref Rng & Units 08/11/2016 08/10/2016 08/09/2016  WBC 4.0 - 10.5 K/uL 12.3(H) 17.3(H) 11.0(H)  Hemoglobin 13.0 - 17.0 g/dL 10.0(L) 11.1(L) 13.3  Hematocrit 39.0 - 52.0 % 27.4(L) 30.7(L) 38.0(L)  Platelets 150 - 400 K/uL 94(L) 105(L) 170     BMET  Recent Labs  08/10/16 1209 08/11/16 0656  NA 139 139  K 3.7 3.5  CL 110 111  CO2 23 22  GLUCOSE 107* 131*  BUN 20 16  CREATININE 1.91* 1.32*  CALCIUM 8.3* 8.2*     Studies/Results: Urine culture: Enterobacter sensitive to ciprofloxacin  Assessment/Plan: Right ureteral stone with febrile UTI: Once medically appropriate, it would be ok to switch to oral ciprofloxacin.  Pt will need 2 weeks of therapy total.  Will then plan to have him f/u with me as an outpatient to recheck urine and discuss definitive stone management with ESWL vs ureteroscopy.     LOS: 2 days   Sherlin Sonier,LES 08/11/2016, 11:36 AM

## 2016-08-11 NOTE — Progress Notes (Signed)
Subconjunctival hemmorrhage outer aspects bilateral eyes.  Wife was first to notice this after surgery(5/7 at 1700) notified this Rn at 2300.  Status unchanged this am.  On call notified at [facility].  Instructed to hold Heparin  Continue to monitior

## 2016-08-12 LAB — CULTURE, BLOOD (ROUTINE X 2)

## 2016-08-12 LAB — COMPREHENSIVE METABOLIC PANEL
ALT: 22 U/L (ref 17–63)
AST: 21 U/L (ref 15–41)
Albumin: 2.6 g/dL — ABNORMAL LOW (ref 3.5–5.0)
Alkaline Phosphatase: 77 U/L (ref 38–126)
Anion gap: 7 (ref 5–15)
BUN: 10 mg/dL (ref 6–20)
CHLORIDE: 111 mmol/L (ref 101–111)
CO2: 22 mmol/L (ref 22–32)
CREATININE: 1.14 mg/dL (ref 0.61–1.24)
Calcium: 8.4 mg/dL — ABNORMAL LOW (ref 8.9–10.3)
Glucose, Bld: 130 mg/dL — ABNORMAL HIGH (ref 65–99)
Potassium: 3.4 mmol/L — ABNORMAL LOW (ref 3.5–5.1)
SODIUM: 140 mmol/L (ref 135–145)
Total Bilirubin: 2.1 mg/dL — ABNORMAL HIGH (ref 0.3–1.2)
Total Protein: 6 g/dL — ABNORMAL LOW (ref 6.5–8.1)

## 2016-08-12 LAB — C DIFFICILE QUICK SCREEN W PCR REFLEX
C DIFFICILE (CDIFF) TOXIN: NEGATIVE
C DIFFICLE (CDIFF) ANTIGEN: NEGATIVE
C Diff interpretation: NOT DETECTED

## 2016-08-12 LAB — CBC
HCT: 26.3 % — ABNORMAL LOW (ref 39.0–52.0)
Hemoglobin: 9.6 g/dL — ABNORMAL LOW (ref 13.0–17.0)
MCH: 32.9 pg (ref 26.0–34.0)
MCHC: 36.5 g/dL — ABNORMAL HIGH (ref 30.0–36.0)
MCV: 90.1 fL (ref 78.0–100.0)
PLATELETS: 112 10*3/uL — AB (ref 150–400)
RBC: 2.92 MIL/uL — AB (ref 4.22–5.81)
RDW: 13.4 % (ref 11.5–15.5)
WBC: 13.6 10*3/uL — AB (ref 4.0–10.5)

## 2016-08-12 MED ORDER — LOPERAMIDE HCL 2 MG PO CAPS: 2.0000 mg | ORAL_CAPSULE | ORAL | Status: DC | PRN

## 2016-08-12 MED ORDER — CIPROFLOXACIN HCL 500 MG PO TABS: 500.0000 mg | ORAL_TABLET | Freq: Two times a day (BID) | ORAL | 0 refills | Status: DC

## 2016-08-12 MED ORDER — CIPROFLOXACIN HCL 500 MG PO TABS: 500.0000 mg | ORAL_TABLET | Freq: Two times a day (BID) | ORAL | Status: DC

## 2016-08-12 MED ORDER — POTASSIUM CHLORIDE CRYS ER 20 MEQ PO TBCR: 20.0000 meq | EXTENDED_RELEASE_TABLET | Freq: Every day | ORAL | 0 refills | Status: DC

## 2016-08-12 MED ORDER — NICOTINE 21 MG/24HR TD PT24: 21.0000 mg | MEDICATED_PATCH | Freq: Every day | TRANSDERMAL | 0 refills | Status: DC

## 2016-08-12 MED ORDER — POTASSIUM CHLORIDE CRYS ER 20 MEQ PO TBCR
40.0000 meq | EXTENDED_RELEASE_TABLET | Freq: Once | ORAL | Status: AC
  Administered 2016-08-12: 40 meq via ORAL
  Filled 2016-08-12: qty 2

## 2016-08-12 NOTE — Progress Notes (Signed)
Pt discharged from the unit via wheelchair. Discharge instructions reviewed with the pt and family member. No questions or concerns at this time. Xavier Goodwin W Jameir Ake, RN

## 2016-08-12 NOTE — Discharge Summary (Signed)
Physician Discharge Summary  Xavier Goodwin ZTI:[id number] DOB: 07-Feb-1961 DOA: 08/09/2016  PCP: Alroy Dust, L.Marlou Sa, MD  Admit date: 08/09/2016 Discharge date: 08/12/2016  Time spent: 35 minutes  Recommendations for Outpatient Follow-up:  1. Please follow final blood culture results.  2. Follow up with urology for stone extraction.  3. Needs CBC to follow hb level.     Discharge Diagnoses:  Principal Problem:   Sepsis secondary to UTI [facility]) Active Problems:   Hypercholesteremia   Prostate cancer ([place])   AKI (acute kidney injury) ([place])   Bacteremia due to Enterobacter species   Cholelithiasis   Discharge Condition: stable.   Diet recommendation: heart healthy   Filed Weights   08/09/16 0343 08/09/16 0543  Weight: 106.6 kg (235 lb) 106.6 kg (235 lb)    History of present illness:  Obryan A Fulmoreis a 56 y.o.malewith medical history significant of hyperlipidemia, tobacco abuse, prostate cancer status post recent robotic assisted radical prostatectomy about 6 weeks ago by Dr. Alinda Money.  He developed severe abdominal pain (woke him up) on Sat AM. It resolved with some pain medications but he subsequently vomited 2 x and then started having rigors. He was having dysuria as well.  Temp 100.1 in ER, HR 120, hypotensive 84/50, UA + for UTI.   Hospital Course:  Severe Sepsis secondary to Enterobacter  UTI and bacteremia in relation of nephrolithiasis - fever documented at 102.7, tachycardia, lactic acidosis, leukocytosis - renal ultrasone 5/6 suggestive of mild right Hydronephrosis - Ct scan 5/7 revealed R  nephrolithiasis - right ureteral stent placed on 5/7- -needs  outpt f/u for stone extraction with Dr Alinda Money -repeated blood culture negative within first 24 hours.  -discharge on ciprofloxacin for 2 weeks.  Enterobacter sensitive to cipro.   Diarrhea;  C diff negative.  Advised to drink plenty of fluids.     AKI (acute kidney injury) component of obstructive uropathy.  -  baseline Cr 0.86-1 - steadily improving- likely due to ureter obstruction, UTI and ATN    -improved with treatment of obstruction, infection.     Prostate cancer s/p radical prostatectomy   - per Urology  Hyperbilirubinemia- cholelithiasis - ? Due to sepsis- other LFTs stable-CT abd pelvis show a stone in the neck of the gallbladder with is not noted to be obstructing- no RUQ pain or nausea-  follow LFT normal.   Acute anemia   - baseline Hb 13-14 - now 10 - may be dilutional- received at least almost 4.5 L of fluid  -needs follow up labs.   Acute Thrombocytopenia - baseline platelets 150-200-  Related to infection, platelet count recovering.   Hypokalemia -replaced orally, discharge on 5 days supplemet  HLD -continue with statins.    Procedures:  Cystourethroscopy, right ureteral stent placement 6 fr double J   Consultations:  Urology, Dr Roni Bread   Discharge Exam: Vitals:   08/12/16 0542 08/12/16 1359  BP: 118/70 120/72  Pulse: 80 90  Resp: 20 20  Temp: 99.8 F (37.7 C) 98.6 F (37 C)    General: Alert in no distress.  Cardiovascular: S 1, S 2 RRR Respiratory: CTA  Discharge Instructions   Discharge Instructions    Diet - low sodium heart healthy    Complete by:  As directed    Increase activity slowly    Complete by:  As directed      Current Discharge Medication List    START taking these medications   Details  ciprofloxacin (CIPRO) 500 MG tablet Take 1 tablet (  500 mg total) by mouth 2 (two) times daily. Qty: 28 tablet, Refills: 0    nicotine (NICODERM CQ - DOSED IN MG/24 HOURS) 21 mg/24hr patch Place 1 patch (21 mg total) onto the skin daily. Qty: 28 patch, Refills: 0    potassium chloride SA (K-DUR,KLOR-CON) 20 MEQ tablet Take 1 tablet (20 mEq total) by mouth daily. Qty: 4 tablet, Refills: 0      CONTINUE these medications which have NOT CHANGED   Details  aspirin EC 81 MG tablet Take 81 mg by mouth daily.     HYDROcodone-acetaminophen (NORCO) 5-325 MG tablet Take 1-2 tablets by mouth every 6 (six) hours as needed for moderate pain or severe pain. Qty: 30 tablet, Refills: 0    nitroGLYCERIN (NITROSTAT) 0.4 MG SL tablet Place 1 tablet (0.4 mg total) under the tongue every 5 (five) minutes as needed for chest pain. Do not take more than 3 tablets Qty: 90 tablet, Refills: 3    rosuvastatin (CRESTOR) 20 MG tablet Take 20 mg by mouth daily.    sildenafil (VIAGRA) 25 MG tablet Take 25 mg by mouth daily as needed for erectile dysfunction.      STOP taking these medications     sulfamethoxazole-trimethoprim (BACTRIM DS,SEPTRA DS) 800-160 MG tablet        No Known Allergies    The results of significant diagnostics from this hospitalization (including imaging, microbiology, ancillary and laboratory) are listed below for reference.    Significant Diagnostic Studies: Dg Chest 2 View  Result Date: 08/09/2016 CLINICAL DATA:  Mid abdominal pain and vomiting since 10:00 a.m. EXAM: CHEST  2 VIEW COMPARISON:  06/29/2016 FINDINGS: Minimal linear scarring or atelectasis in the left lung. The right lung is clear. No pleural effusions. Normal hilar, mediastinal and cardiac contours. IMPRESSION: Minimal linear scarring or atelectasis in the left base. Otherwise unremarkable. Electronically Signed   By: Andreas Newport M.D.   On: 08/09/2016 04:47   Ct Abdomen Pelvis W Contrast  Result Date: 08/10/2016 CLINICAL DATA:  Right hydronephrosis on ultrasound. Prostate cancer. Prostatectomy 6 weeks ago. Fever EXAM: CT ABDOMEN AND PELVIS WITH CONTRAST TECHNIQUE: Multidetector CT imaging of the abdomen and pelvis was performed using the standard protocol following bolus administration of intravenous contrast. CONTRAST:  60mL ISOVUE-300 IOPAMIDOL (ISOVUE-300) INJECTION 61% COMPARISON:  Ultrasound abdomen 08/09/2016 FINDINGS: Lower chest: Lung bases clear Hepatobiliary: Large calcified gallstone in the neck of the  gallbladder measures 22 x 14 mm. No gallbladder wall thickening or biliary dilatation. Negative liver. Pancreas: Negative Spleen: Negative Adrenals/Urinary Tract: Mild to moderate hydronephrosis on the right. Obstructing stone in the proximal right ureter measures 5 x 7 mm. Perinephric edema is present on the right. Additional nonobstructing right upper pole stone measures approximately 6 x 12 mm. No left renal calculi. No left hydronephrosis. Bladder wall diffusely thickened. This may be due to recent surgery or cystitis. Postop prostatectomy. No fluid collection or hematoma. Stomach/Bowel: Negative for bowel obstruction. No bowel mass or edema. Normal appendix Vascular/Lymphatic: Mild atherosclerotic disease Reproductive: Prostatectomy without complications Other: Negative for free fluid. Musculoskeletal: Degenerative anterolisthesis L5-S1. No acute skeletal abnormality. IMPRESSION: 5 x 7 mm obstructing stone proximal right ureter. Additional 6 x 12 mm right upper pole calculus. Large calcified gallstone without gallbladder wall thickening Bladder wall thickening may be due to recent surgery or cystitis. No hematoma in the pelvis. Electronically Signed   By: Franchot Gallo M.D.   On: 08/10/2016 16:53   US Renal  Result Date: 08/09/2016 CLINICAL DATA:  Renal failure and prostate cancer EXAM: RENAL / URINARY TRACT ULTRASOUND COMPLETE COMPARISON:  None. FINDINGS: Right Kidney: Length: 13 cm. Mild hydronephrosis. No evidence of mass or collection. Left Kidney: Length: 14 cm. No comparison indeterminate this is normal size for this patient. Echogenicity within normal limits. No mass or hydronephrosis visualized. Bladder: Moderate distension which may be physiologic. No superimposed focal wall thickening or debris. IMPRESSION: Mild hydronephrosis on the right.  No left hydronephrosis. Electronically Signed   By: Monte Fantasia M.D.   On: 08/09/2016 15:22   Dg C-arm 1-60 Min-no Report  Result Date:  08/10/2016 Fluoroscopy was utilized by the requesting physician.  No radiographic interpretation.    Microbiology: Recent Results (from the past 240 hour(s))  Culture, blood (Routine x 2)     Status: Abnormal   Collection Time: 08/09/16  3:54 AM  Result Value Ref Range Status   Specimen Description BLOOD LEFT ANTECUBITAL  Final   Special Requests IN PEDIATRIC BOTTLE BCAV  Final   Culture  Setup Time   Final    IN PEDIATRIC BOTTLE GRAM NEGATIVE RODS CRITICAL VALUE NOTED.  VALUE IS CONSISTENT WITH PREVIOUSLY REPORTED AND CALLED VALUE.    Culture (A)  Final    ENTEROBACTER CLOACAE SUSCEPTIBILITIES PERFORMED ON PREVIOUS CULTURE WITHIN THE LAST 5 DAYS. Performed at [facility], [place] [address], [place], [place] [address]    Report Status 08/12/2016 FINAL  Final  Urine culture     Status: Abnormal   Collection Time: 08/09/16  3:54 AM  Result Value Ref Range Status   Specimen Description URINE, RANDOM  Final   Special Requests NONE  Final   Culture >=100,000 COLONIES/mL ENTEROBACTER CLOACAE (A)  Final   Report Status 08/11/2016 FINAL  Final   Organism ID, Bacteria ENTEROBACTER CLOACAE (A)  Final      Susceptibility   Enterobacter cloacae - MIC*    CEFAZOLIN >=64 RESISTANT Resistant     CEFTRIAXONE <=1 SENSITIVE Sensitive     CIPROFLOXACIN <=0.25 SENSITIVE Sensitive     GENTAMICIN <=1 SENSITIVE Sensitive     IMIPENEM <=0.25 SENSITIVE Sensitive     NITROFURANTOIN 64 INTERMEDIATE Intermediate     TRIMETH/SULFA <=20 SENSITIVE Sensitive     PIP/TAZO <=4 SENSITIVE Sensitive     * >=100,000 COLONIES/mL ENTEROBACTER CLOACAE  Culture, blood (Routine x 2)     Status: Abnormal   Collection Time: 08/09/16  3:59 AM  Result Value Ref Range Status   Specimen Description BLOOD RIGHT ANTECUBITAL  Final   Special Requests BOTTLES DRAWN AEROBIC AND ANAEROBIC BCAV  Final   Culture  Setup Time   Final    IN BOTH AEROBIC AND ANAEROBIC BOTTLES GRAM NEGATIVE RODS CRITICAL RESULT CALLED TO, READ  BACK BY AND VERIFIED WITH: TO BGREEN(PHARMd) BY TCLEVELAND 08/09/2016 AT 11:51PM Performed at [facility], [place] [address] [place], [place] [address]    Culture ENTEROBACTER SPECIES (A)  Final   Report Status 08/12/2016 FINAL  Final   Organism ID, Bacteria ENTEROBACTER SPECIES  Final      Susceptibility   Enterobacter species - MIC*    CEFAZOLIN >=64 RESISTANT Resistant     CEFEPIME <=1 SENSITIVE Sensitive     CEFTAZIDIME <=1 SENSITIVE Sensitive     CEFTRIAXONE <=1 SENSITIVE Sensitive     CIPROFLOXACIN <=0.25 SENSITIVE Sensitive     GENTAMICIN <=1 SENSITIVE Sensitive     IMIPENEM <=0.25 SENSITIVE Sensitive     TRIMETH/SULFA <=20 SENSITIVE Sensitive     PIP/TAZO <=  4 SENSITIVE Sensitive     * ENTEROBACTER SPECIES  Blood Culture ID Panel (Reflexed)     Status: Abnormal   Collection Time: 08/09/16  3:59 AM  Result Value Ref Range Status   Enterococcus species NOT DETECTED NOT DETECTED Final   Listeria monocytogenes NOT DETECTED NOT DETECTED Final   Staphylococcus species NOT DETECTED NOT DETECTED Final   Staphylococcus aureus NOT DETECTED NOT DETECTED Final   Streptococcus species NOT DETECTED NOT DETECTED Final   Streptococcus agalactiae NOT DETECTED NOT DETECTED Final   Streptococcus pneumoniae NOT DETECTED NOT DETECTED Final   Streptococcus pyogenes NOT DETECTED NOT DETECTED Final   Acinetobacter baumannii NOT DETECTED NOT DETECTED Final   Enterobacteriaceae species DETECTED (A) NOT DETECTED Final    Comment: Enterobacteriaceae represent a large family of gram-negative bacteria, not a single organism. CRITICAL RESULT CALLED TO, READ BACK BY AND VERIFIED WITH: BGREEN(PHARMd) BY TCLEVELAND 08/09/2016 AT 11:54PM    Enterobacter cloacae complex DETECTED (A) NOT DETECTED Final    Comment: CRITICAL RESULT CALLED TO, READ BACK BY AND VERIFIED WITH: TO BGREEN(PHARMd)BY TCLEVELAND 08/09/2016 AT 11:54PM    Escherichia coli NOT DETECTED NOT DETECTED Final   Klebsiella oxytoca NOT  DETECTED NOT DETECTED Final   Klebsiella pneumoniae NOT DETECTED NOT DETECTED Final   Proteus species NOT DETECTED NOT DETECTED Final   Serratia marcescens NOT DETECTED NOT DETECTED Final   Carbapenem resistance NOT DETECTED NOT DETECTED Final   Haemophilus influenzae NOT DETECTED NOT DETECTED Final   Neisseria meningitidis NOT DETECTED NOT DETECTED Final   Pseudomonas aeruginosa NOT DETECTED NOT DETECTED Final   Candida albicans NOT DETECTED NOT DETECTED Final   Candida glabrata NOT DETECTED NOT DETECTED Final   Candida krusei NOT DETECTED NOT DETECTED Final   Candida parapsilosis NOT DETECTED NOT DETECTED Final   Candida tropicalis NOT DETECTED NOT DETECTED Final    Comment: Performed at [facility], [place]. [address] [place], [place] [address]  Urine culture     Status: Abnormal (Preliminary result)   Collection Time: 08/10/16  6:29 PM  Result Value Ref Range Status   Specimen Description URINE, RANDOM  Final   Special Requests NONE  Final   Culture (A)  Final    2,000 COLONIES/mL ENTEROBACTER SPECIES SUSCEPTIBILITIES TO FOLLOW Performed at [facility], [place] [address] [place], [place] [address]    Report Status PENDING  Incomplete  Gram stain     Status: None   Collection Time: 08/10/16  6:29 PM  Result Value Ref Range Status   Specimen Description URINE, RANDOM  Final   Special Requests NONE  Final   Gram Stain   Final    WBC PRESENT,BOTH PMN AND MONONUCLEAR GRAM NEGATIVE RODS CYTOSPIN SMEAR Performed at [facility], [place] [address], [place], [place] [address]    Report Status 08/10/2016 FINAL  Final  Culture, blood (Routine X 2) w Reflex to ID Panel     Status: None (Preliminary result)   Collection Time: 08/11/16 10:11 AM  Result Value Ref Range Status   Specimen Description BLOOD LEFT ARM  Final   Special Requests   Final    BOTTLES DRAWN AEROBIC AND ANAEROBIC Blood Culture adequate volume   Culture   Final    NO GROWTH < 24 HOURS Performed at  [facility], [place] [address] [place], [place] [address]    Report Status PENDING  Incomplete  Culture, blood (Routine X 2) w Reflex to ID Panel  Status: None (Preliminary result)   Collection Time: 08/11/16 10:11 AM  Result Value Ref Range Status   Specimen Description BLOOD LEFT HAND  Final   Special Requests   Final    BOTTLES DRAWN AEROBIC ONLY Blood Culture adequate volume   Culture   Final    NO GROWTH < 24 HOURS Performed at [facility], [address], [place], [place] [address]    Report Status PENDING  Incomplete  C difficile quick scan w PCR reflex     Status: None   Collection Time: 08/12/16  9:49 AM  Result Value Ref Range Status   C Diff antigen NEGATIVE NEGATIVE Final   C Diff toxin NEGATIVE NEGATIVE Final   C Diff interpretation No C. difficile detected.  Final     Labs: Basic Metabolic Panel:  Recent Labs Lab 08/09/16 0354 08/10/16 0542 08/10/16 1209 08/11/16 0656 08/12/16 0555  NA 137 140 139 139 140  K 3.2* 4.1 3.7 3.5 3.4*  CL 108 110 110 111 111  CO2 19* 23 23 22 22   GLUCOSE 193* 113* 107* 131* 130*  BUN 14 19 20 16 10   CREATININE 1.76* 1.94* 1.91* 1.32* 1.14  CALCIUM 9.1 8.2* 8.3* 8.2* 8.4*   Liver Function Tests:  Recent Labs Lab 08/09/16 0354 08/10/16 0542 08/10/16 1209 08/11/16 0656 08/12/16 0555  AST 31 27 26 21 21   ALT 25 24 25 19 22   ALKPHOS 74 65 69 75 77  BILITOT 1.9* 2.7* 2.5* 2.2* 2.1*  PROT 7.1 6.0* 6.4* 5.8* 6.0*  ALBUMIN 3.5 2.7* 2.9* 2.5* 2.6*   No results for input(s): LIPASE, AMYLASE in the last 168 hours. No results for input(s): AMMONIA in the last 168 hours. CBC:  Recent Labs Lab 08/09/16 0354 08/10/16 0542 08/11/16 0656 08/12/16 0555  WBC 11.0* 17.3* 12.3* 13.6*  NEUTROABS 10.0*  --   --   --   HGB 13.3 11.1* 10.0* 9.6*  HCT 38.0* 30.7* 27.4* 26.3*  MCV 94.3 93.3 91.6 90.1  PLT 170 105* 94* 112*   Cardiac Enzymes: No results for input(s): CKTOTAL, CKMB, CKMBINDEX, TROPONINI in the last  168 hours. BNP: BNP (last 3 results)  Recent Labs  11/20/15 1058  BNP 10.5    ProBNP (last 3 results) No results for input(s): PROBNP in the last 8760 hours.  CBG: No results for input(s): GLUCAP in the last 168 hours.     Signed:  Elmarie Shiley MD.  [facility] 08/12/2016, 3:04 PM

## 2016-08-12 NOTE — Progress Notes (Signed)
Patient ID: Xavier Goodwin, male   DOB: 1960/09/16, 56 y.o.   MRN: [id number]  2 Days Post-Op Subjective: Pt continues to feel better. No fever recently.  Pain is minimal.  Objective: Vital signs in last 24 hours: Temp:  [99.8 F (37.7 C)-100.3 F (37.9 C)] 99.8 F (37.7 C) (05/09 0542) Pulse Rate:  [79-80] 80 (05/09 0542) Resp:  [20] 20 (05/09 0542) BP: (118-130)/(70-85) 118/70 (05/09 0542) SpO2:  [94 %-97 %] 94 % (05/09 0542)  Intake/Output from previous day: 05/08 0701 - 05/09 0700 In: 392 [P.O.:342; IV Piggyback:50] Out: 3000 [Urine:3000] Intake/Output this shift: No intake/output data recorded.  Physical Exam:  General: Alert and oriented Abd: NT  Lab Results:  Recent Labs  08/10/16 0542 08/11/16 0656 08/12/16 0555  HGB 11.1* 10.0* 9.6*  HCT 30.7* 27.4* 26.3*   CBC Latest Ref Rng & Units 08/12/2016 08/11/2016 08/10/2016  WBC 4.0 - 10.5 K/uL 13.6(H) 12.3(H) 17.3(H)  Hemoglobin 13.0 - 17.0 g/dL 9.6(L) 10.0(L) 11.1(L)  Hematocrit 39.0 - 52.0 % 26.3(L) 27.4(L) 30.7(L)  Platelets 150 - 400 K/uL 112(L) 94(L) 105(L)     BMET  Recent Labs  08/11/16 0656 08/12/16 0555  NA 139 140  K 3.5 3.4*  CL 111 111  CO2 22 22  GLUCOSE 131* 130*  BUN 16 10  CREATININE 1.32* 1.14  CALCIUM 8.2* 8.4*     Studies/Results: Ct Abdomen Pelvis W Contrast  Result Date: 08/10/2016 CLINICAL DATA:  Right hydronephrosis on ultrasound. Prostate cancer. Prostatectomy 6 weeks ago. Fever EXAM: CT ABDOMEN AND PELVIS WITH CONTRAST TECHNIQUE: Multidetector CT imaging of the abdomen and pelvis was performed using the standard protocol following bolus administration of intravenous contrast. CONTRAST:  77mL ISOVUE-300 IOPAMIDOL (ISOVUE-300) INJECTION 61% COMPARISON:  Ultrasound abdomen 08/09/2016 FINDINGS: Lower chest: Lung bases clear Hepatobiliary: Large calcified gallstone in the neck of the gallbladder measures 22 x 14 mm. No gallbladder wall thickening or biliary dilatation. Negative liver.  Pancreas: Negative Spleen: Negative Adrenals/Urinary Tract: Mild to moderate hydronephrosis on the right. Obstructing stone in the proximal right ureter measures 5 x 7 mm. Perinephric edema is present on the right. Additional nonobstructing right upper pole stone measures approximately 6 x 12 mm. No left renal calculi. No left hydronephrosis. Bladder wall diffusely thickened. This may be due to recent surgery or cystitis. Postop prostatectomy. No fluid collection or hematoma. Stomach/Bowel: Negative for bowel obstruction. No bowel mass or edema. Normal appendix Vascular/Lymphatic: Mild atherosclerotic disease Reproductive: Prostatectomy without complications Other: Negative for free fluid. Musculoskeletal: Degenerative anterolisthesis L5-S1. No acute skeletal abnormality. IMPRESSION: 5 x 7 mm obstructing stone proximal right ureter. Additional 6 x 12 mm right upper pole calculus. Large calcified gallstone without gallbladder wall thickening Bladder wall thickening may be due to recent surgery or cystitis. No hematoma in the pelvis. Electronically Signed   By: Franchot Gallo M.D.   On: 08/10/2016 16:53   Dg C-arm 1-60 Min-no Report  Result Date: 08/10/2016 Fluoroscopy was utilized by the requesting physician.  No radiographic interpretation.    Urine culture: Enterobacter sensitive to ciprofloxacin.  Assessment/Plan: Right ureteral stone with febrile UTI: Pt will need 2 weeks of culture sensitive antibiotics.  I will arrange f/u in about 2 weeks to recheck urine and to discuss definitive treatment for his stone(s).  Will d/c catheter.  Pt clinically improved despite higher WBC.    LOS: 3 days   Veleda Mun,LES 08/12/2016, 7:19 AM

## 2016-08-13 LAB — URINE CULTURE

## 2016-08-14 ENCOUNTER — Encounter (HOSPITAL_COMMUNITY): Payer: Self-pay | Admitting: Urology

## 2016-08-16 LAB — CULTURE, BLOOD (ROUTINE X 2)
CULTURE: NO GROWTH
Culture: NO GROWTH
SPECIAL REQUESTS: ADEQUATE
Special Requests: ADEQUATE

## 2016-08-18 DIAGNOSIS — R8271 Bacteriuria: Secondary | ICD-10-CM | POA: Diagnosis not present

## 2016-08-18 DIAGNOSIS — N202 Calculus of kidney with calculus of ureter: Secondary | ICD-10-CM | POA: Diagnosis not present

## 2016-08-19 ENCOUNTER — Other Ambulatory Visit: Payer: Self-pay | Admitting: Urology

## 2016-08-19 DIAGNOSIS — A419 Sepsis, unspecified organism: Secondary | ICD-10-CM | POA: Diagnosis not present

## 2016-08-21 ENCOUNTER — Encounter (HOSPITAL_COMMUNITY): Payer: Self-pay | Admitting: *Deleted

## 2016-08-21 NOTE — H&P (Signed)
CC/HPI: Right ureteral calculus and right renal calculus   [name] is approximately 6 weeks out from a robotic prostatectomy and had been recovering well until he developed fever along with abdominal pain approximately 1 week ago. He was hospitalized after presenting to the emergency department and underwent a renal ultrasound that demonstrated moderate right-sided hydronephrosis. A subsequent CT stone study confirmed a 7 mm obstructing proximal right ureteral calculus. He was taken to the operating room and underwent urgent ureteral stent placement last Monday evening. He subsequently improved. His urine culture and blood cultures were positive for Enterobacter sensitive to ciprofloxacin. He was discharged home on oral ciprofloxacin. He has been completely afebrile. He has expected stent symptoms including urinary urgency and frequency but maintains pretty good continence. He will have some occasional right-sided flank pain again consistent with stent colic.     ALLERGIES: No Known Allergies    MEDICATIONS: Aspirin 81 mg tablet, chewable  Bactrim Ds  Sildenafil 20 mg tablet 1-5 tablets PO PRN as directed  Rosuvastatin Calcium 20 mg tablet     GU PSH: ESWL, [place] - 2006 Laparoscopy; Lymphadenectomy - 07/02/2016 Locm 300-399Mg /Ml Iodine,1Ml - 06/03/2016 Prostate Needle Biopsy - 05/18/2016 Robotic Radical Prostatectomy - 07/02/2016    NON-GU PSH: Colonoscopy - 2016 Elbow Surgery (Unspecified), Left - 1976 Surgical Pathology, Gross And Microscopic Examination For Prostate Needle - 05/18/2016    GU PMH: ED due to arterial insufficiency - 07/14/2016 Stress Incontinence - 07/14/2016 Prostate Cancer - 07/09/2016, - 05/25/2016 BPH w/LUTS - 04/20/2016 Elevated PSA - 04/20/2016 Urinary Frequency - 04/20/2016 Urinary Urgency - 04/20/2016 Renal calculus      PMH Notes:   1) Prostate cancer: He is s/p a BNS RAL radical prostatectomy and BPLND on 07/02/16.   Diagnosis:  Pretreatment PSA: 13.7   Pretreatment SHIM score: 23   NON-GU PMH: Other lack of coordination - 06/22/2016 Muscle weakness (generalized) - 06/08/2016 Hypercholesterolemia Sickle-cell trait    FAMILY HISTORY: Atrial Fibrillation - Mother Congestive Heart Failure - Mother Diabetes - Mother father deceased at age 46 - Father Heart Attack - Mother, Father Hypertension - Father, Mother kidney disease - Mother Lung Cancer - Sister lupus - Sister sickle cell trait - Runs in Family   SOCIAL HISTORY: Marital Status: Married Current Smoking Status: Patient smokes. Has smoked since 04/06/1986. Smokes 1 pack per day.   Tobacco Use Assessment Completed: Used Tobacco in last 30 days? Has never drank.  Does not drink caffeine. Patient's occupation is/was Retired- [employer].    REVIEW OF SYSTEMS:    GU Review Male:   Patient denies frequent urination, hard to postpone urination, burning/ pain with urination, get up at night to urinate, leakage of urine, stream starts and stops, trouble starting your streams, and have to strain to urinate  Gastrointestinal (Upper):   Patient denies nausea and vomiting.  Gastrointestinal (Lower):   Patient reports diarrhea. Patient denies constipation.  Constitutional:   Patient denies fever, night sweats, weight loss, and fatigue.  Skin:   Patient denies skin rash/ lesion and itching.  Eyes:   Patient denies blurred vision and double vision.  Ears/ Nose/ Throat:   Patient denies sore throat and sinus problems.  Hematologic/Lymphatic:   Patient denies swollen glands and easy bruising.  Cardiovascular:   Patient denies leg swelling and chest pains.  Respiratory:   Patient denies cough and shortness of breath.  Endocrine:   Patient denies excessive thirst.  Musculoskeletal:   Patient denies back pain and joint pain.  Neurological:  Patient denies headaches and dizziness.  Psychologic:   Patient denies anxiety and depression.   VITAL SIGNS:    Weight 235 lb / 106.59 kg  Height 73  in / 185.42 cm  BP 112/73 mmHg  Pulse 75 /min  BMI 31.0 kg/m   MULTI-SYSTEM PHYSICAL EXAMINATION:    Constitutional: Well-nourished. No physical deformities. Normally developed. Good grooming.  Neck: Neck symmetrical, not swollen. Normal tracheal position.  Respiratory: No labored breathing, no use of accessory muscles.   Cardiovascular: Normal temperature, normal extremity pulses, no swelling, no varicosities.  Lymphatic: No enlargement of neck, axillae, groin.  Skin: No paleness, no jaundice, no cyanosis. No lesion, no ulcer, no rash.  Neurologic / Psychiatric: Oriented to time, oriented to place, oriented to person. No depression, no anxiety, no agitation.  Gastrointestinal: No mass, no tenderness, no rigidity, non obese abdomen.  Eyes: Normal conjunctivae. Normal eyelids.  Ears, Nose, Mouth, and Throat: Left ear no scars, no lesions, no masses. Right ear no scars, no lesions, no masses. Nose no scars, no lesions, no masses. Normal hearing. Normal lips.  Musculoskeletal: Normal gait and station of head and neck.     PAST DATA REVIEWED:  Source Of History:  Patient  Records Review:   Previous Patient Records  Urine Test Review:   Urinalysis  X-Ray Review: KUB: Reviewed Films. I independently reviewed his KUB x-ray today. His ureteral stent is in the appropriate position. The previously noted right proximal ureteral stone is now located in the lower pole of the right kidney in a nonobstructing position. It measures approximately 7 mm. There is another stone in the upper pole of the kidney that was previously noted on the CT scan it measures approximately 6 mm. Hounsfield measurements of his lower pole stone are 788. C.T. Abdomen/Pelvis: Reviewed Films.         Urinalysis w/Scope Dipstick Dipstick Cont'd Micro  Color: Yellow Bilirubin: Neg WBC/hpf: 0 - 5/hpf  Appearance: Clear Ketones: Neg RBC/hpf: 0 - 2/hpf  Specific Gravity: 1.015 Blood: 3+ Bacteria: NS (Not Seen)  pH: 6.0 Protein:  Trace Cystals: NS (Not Seen)  Glucose: Neg Urobilinogen: 0.2 Casts: NS (Not Seen)    Nitrites: Neg Trichomonas: Not Present    Leukocyte Esterase: Neg Mucous: Present      Epithelial Cells: 0 - 5/hpf      Yeast: NS (Not Seen)      Sperm: Not Present    ASSESSMENT/PLAN:      Right renal calculi: We reviewed options for definitive treatment of his stone disease. He will complete his course of ciprofloxacin. We discussed options of shockwave lithotripsy versus ureteroscopic laser lithotripsy. Considering the density of the stone on CT imaging, the size of the stones, and his desire to avoid more invasive endoscopic therapy considering his recent prostatectomy and relatively recent urethral anastomosis, he would like to proceed with shockwave lithotripsy. We have reviewed the potential risks and complications of this procedure in detail including bleeding, infection, failure of the procedure, need for additional procedures, damage or loss of the kidney, etc.   The plan will be to treat his lower pole stone as this was the one that previously became symptomatic with plans to treat his upper pole stone should the lower pole stone seemed to fragment well.   He will then be scheduled in approximately 1-2 weeks with a KUB x-ray and for stent removal.

## 2016-08-21 NOTE — Discharge Instructions (Signed)
Lithotripsy, Care After This sheet gives you information about how to care for yourself after your procedure. Your health care provider may also give you more specific instructions. If you have problems or questions, contact your health care provider. What can I expect after the procedure? After the procedure, it is common to have:  Some blood in your urine. This should only last for a few days.  Soreness in your back, sides, or upper abdomen for a few days.  Blotches or bruises on your back where the pressure wave entered the skin.  Pain, discomfort, or nausea when pieces (fragments) of the kidney stone move through the tube that carries urine from the kidney to the bladder (ureter). Stone fragments may pass soon after the procedure, but they may continue to pass for up to 4-8 weeks.  If you have severe pain or nausea, contact your health care provider. This may be caused by a large stone that was not broken up, and this may mean that you need more treatment.  Some pain or discomfort during urination.  Some pain or discomfort in the lower abdomen or (in men) at the base of the penis. Follow these instructions at home: Medicines   Take over-the-counter and prescription medicines only as told by your health care provider.  If you were prescribed an antibiotic medicine, take it as told by your health care provider. Do not stop taking the antibiotic even if you start to feel better.  Do not drive for 24 hours if you were given a medicine to help you relax (sedative).  Do not drive or use heavy machinery while taking prescription pain medicine. Eating and drinking   Drink enough water and fluids to keep your urine clear or pale yellow. This helps any remaining pieces of the stone to pass. It can also help prevent new stones from forming.  Eat plenty of fresh fruits and vegetables.  Follow instructions from your health care provider about eating and drinking restrictions. You may be  instructed:  To reduce how much salt (sodium) you eat or drink. Check ingredients and nutrition facts on packaged foods and beverages.  To reduce how much meat you eat.  Eat the recommended amount of calcium for your age and gender. Ask your health care provider how much calcium you should have. General instructions   Get plenty of rest.  Most people can resume normal activities 1-2 days after the procedure. Ask your health care provider what activities are safe for you.  If directed, strain all urine through the strainer that was provided by your health care provider.  Keep all fragments for your health care provider to see. Any stones that are found may be sent to a medical lab for examination. The stone may be as small as a grain of salt.  Keep all follow-up visits as told by your health care provider. This is important. Contact a health care provider if:  You have pain that is severe or does not get better with medicine.  You have nausea that is severe or does not go away.  You have blood in your urine longer than your health care provider told you to expect.  You have more blood in your urine.  You have pain during urination that does not go away.  You urinate more frequently than usual and this does not go away.  You develop a rash or any other possible signs of an allergic reaction. Get help right away if:  You have severe  pain in your back, sides, or upper abdomen.  You have severe pain while urinating.  Your urine is very dark red.  You have blood in your stool (feces).  You cannot pass any urine at all.  You feel a strong urge to urinate after emptying your bladder.  You have a fever or chills.  You develop shortness of breath, difficulty breathing, or chest pain.  You have severe nausea that leads to persistent vomiting.  You faint. Summary  After this procedure, it is common to have some pain, discomfort, or nausea when pieces (fragments) of the  kidney stone move through the tube that carries urine from the kidney to the bladder (ureter). If this pain or nausea is severe, however, you should contact your health care provider.  Most people can resume normal activities 1-2 days after the procedure. Ask your health care provider what activities are safe for you.  Drink enough water and fluids to keep your urine clear or pale yellow. This helps any remaining pieces of the stone to pass, and it can help prevent new stones from forming.  If directed, strain your urine and keep all fragments for your health care provider to see. Fragments or stones may be as small as a grain of salt.  Get help right away if you have severe pain in your back, sides, or upper abdomen or have severe pain while urinating.

## 2016-08-24 ENCOUNTER — Encounter (HOSPITAL_COMMUNITY)
Admission: RE | Disposition: A | Discharge status: Home / Self Care | Payer: Self-pay | Source: Ambulatory Visit | Attending: Urology

## 2016-08-24 ENCOUNTER — Ambulatory Visit (HOSPITAL_COMMUNITY)
Admission: RE | Admit: 2016-08-24 | Discharge: 2016-08-24 | Disposition: A | Discharge status: Home / Self Care | Payer: Commercial Managed Care - HMO | Source: Ambulatory Visit | Attending: Urology | Admitting: Urology

## 2016-08-24 ENCOUNTER — Encounter (HOSPITAL_COMMUNITY): Payer: Self-pay | Admitting: *Deleted

## 2016-08-24 ENCOUNTER — Ambulatory Visit (HOSPITAL_COMMUNITY): Payer: Commercial Managed Care - HMO

## 2016-08-24 DIAGNOSIS — F17200 Nicotine dependence, unspecified, uncomplicated: Secondary | ICD-10-CM | POA: Insufficient documentation

## 2016-08-24 DIAGNOSIS — N200 Calculus of kidney: Secondary | ICD-10-CM | POA: Diagnosis present

## 2016-08-24 DIAGNOSIS — Z7982 Long term (current) use of aspirin: Secondary | ICD-10-CM | POA: Insufficient documentation

## 2016-08-24 HISTORY — DX: Calculus of gallbladder without cholecystitis without obstruction: K80.20

## 2016-08-24 HISTORY — DX: Pneumonia, unspecified organism: J18.9

## 2016-08-24 HISTORY — DX: Personal history of urinary (tract) infections: Z87.440

## 2016-08-24 HISTORY — DX: Personal history of other diseases of the respiratory system: Z87.09

## 2016-08-24 HISTORY — DX: Sepsis, unspecified organism: A41.9

## 2016-08-24 HISTORY — PX: EXTRACORPOREAL SHOCK WAVE LITHOTRIPSY: SHX1557

## 2016-08-24 SURGERY — LITHOTRIPSY, ESWL
Anesthesia: LOCAL | Laterality: Right

## 2016-08-24 MED ORDER — DIPHENHYDRAMINE HCL 25 MG PO CAPS
25.0000 mg | ORAL_CAPSULE | ORAL | Status: AC
  Administered 2016-08-24: 25 mg via ORAL
  Filled 2016-08-24: qty 1

## 2016-08-24 MED ORDER — TAMSULOSIN HCL 0.4 MG PO CAPS: 0.4000 mg | ORAL_CAPSULE | ORAL | 0 refills | Status: DC

## 2016-08-24 MED ORDER — DIAZEPAM 5 MG PO TABS
10.0000 mg | ORAL_TABLET | ORAL | Status: AC
  Administered 2016-08-24: 10 mg via ORAL
  Filled 2016-08-24: qty 2

## 2016-08-24 MED ORDER — CIPROFLOXACIN HCL 500 MG PO TABS
500.0000 mg | ORAL_TABLET | ORAL | Status: AC
  Administered 2016-08-24: 500 mg via ORAL
  Filled 2016-08-24: qty 1

## 2016-08-24 MED ORDER — SODIUM CHLORIDE 0.9 % IV SOLN
INTRAVENOUS | Status: DC
  Administered 2016-08-24: 09:00:00 via INTRAVENOUS

## 2016-08-24 MED ORDER — PROMETHAZINE HCL 25 MG PO TABS: 25.0000 mg | ORAL_TABLET | Freq: Four times a day (QID) | ORAL | 0 refills | Status: DC | PRN

## 2016-08-24 NOTE — Op Note (Signed)
See [facility] OP note scanned into chart. Also because of the size, density, location and other factors that cannot be anticipated I feel this will likely be a staged procedure. This fact supersedes any indication in the scanned [facility] operative note to the contrary.  

## 2016-08-25 ENCOUNTER — Encounter (HOSPITAL_COMMUNITY): Payer: Self-pay | Admitting: Urology

## 2016-09-02 DIAGNOSIS — N201 Calculus of ureter: Secondary | ICD-10-CM | POA: Diagnosis not present

## 2016-09-21 DIAGNOSIS — R748 Abnormal levels of other serum enzymes: Secondary | ICD-10-CM | POA: Diagnosis not present

## 2016-09-24 ENCOUNTER — Encounter: Payer: Self-pay | Admitting: Cardiovascular Disease

## 2016-10-06 DIAGNOSIS — C61 Malignant neoplasm of prostate: Secondary | ICD-10-CM | POA: Diagnosis not present

## 2016-10-06 DIAGNOSIS — N201 Calculus of ureter: Secondary | ICD-10-CM | POA: Diagnosis not present

## 2016-10-06 NOTE — Progress Notes (Signed)
Patient ID: Xavier Goodwin, male   DOB: 1960/04/18, 56 y.o.   MRN: [id number]   56 y.o.  Initially  referred by [name] for chest pain in 2015  Myovue at that time normal EF 51%    Calcium Score done 10/16/15 reviewed: 266 which was 81st percentile for age and sex  Chest pain August 2017 with abnormal myovue EF 48% moderate sized area of mid and basal lateral wall ischemia  Cath: 11/25/15 with only 50% proximal Ramus and 30% proximal circumflex   Drinks lots of sweet tea Discussed non sugar alternatives like crystal light Also has [employer] candy A1c borderline  Grandson/Son moved back to [place] from [place] has job security at [employer] passed away 3 months ago Since I last saw him had prostate removed by Dr Alinda Money for early stage CA   ROS: Denies fever, malais, weight loss, blurry vision, decreased visual acuity, cough, sputum, SOB, hemoptysis, pleuritic pain, palpitaitons, heartburn, abdominal pain, melena, lower extremity edema, claudication, or rash.  All other systems reviewed and negative   General: Affect appropriate Healthy:  appears stated age 2: normal Neck supple with no adenopathy JVP normal no bruits no thyromegaly Lungs clear with no wheezing and good diaphragmatic motion Heart:  S1/S2 no murmur, no rub, gallop or click PMI normal Abdomen: benighn, BS positve, no tenderness, no AAA no bruit.  No HSM or HJR Distal pulses intact with no bruits No edema Neuro non-focal Skin warm and dry No muscular weakness   Medications Current Outpatient Prescriptions  Medication Sig Dispense Refill  . aspirin EC 81 MG tablet Take 81 mg by mouth daily.    . rosuvastatin (CRESTOR) 20 MG tablet Take 20 mg by mouth daily.     No current facility-administered medications for this visit.     Allergies Patient has no known allergies.  Family History: Family History  Problem Relation Age of Onset  . Hypertension Father   . Heart attack Father   . Diabetes  Mother   . Cancer Mother   . Lupus Sister     Social History: Social History   Social History  . Marital status: Married    Spouse name: N/A  . Number of children: N/A  . Years of education: N/A   Occupational History  . Not on file.   Social History Main Topics  . Smoking status: Former Smoker    Packs/day: 1.00    Years: 30.00    Types: Cigarettes    Quit date: 08/04/2016  . Smokeless tobacco: Never Used     Comment: greater than 20 years   . Alcohol use No  . Drug use: No  . Sexual activity: Not on file   Other Topics Concern  . Not on file   Social History Narrative  . No narrative on file    Electrocardiogram:   07/26/13  SR rate 70 early repolarization   07/12/14  NSR rate 79 normal 10/10/15 SR rate 73 insig Q waves infer and lateral  10/12/16  SR rate 76 normal   Assessment and Plan Chol:  On statin labs with Dr Alroy Dust Chest Pain: Atypical cath 11/25/15 no obstructive disease High calcium score continue ASA and statin  Smoking:  counseled on cessation and risk of cancer and vascular disease DM:  Has too much candy will have A1c with Dr Bebe Shaggy Prostate:  F/u Dr Alinda Money with PSA    Jenkins Rouge

## 2016-10-12 ENCOUNTER — Encounter: Payer: Self-pay | Admitting: Cardiovascular Disease

## 2016-10-12 ENCOUNTER — Ambulatory Visit (INDEPENDENT_AMBULATORY_CARE_PROVIDER_SITE_OTHER): Payer: 59 | Admitting: Cardiovascular Disease

## 2016-10-12 VITALS — BP 104/60 | HR 75 | Ht 73.0 in | Wt 230.6 lb

## 2016-10-12 DIAGNOSIS — E7800 Pure hypercholesterolemia, unspecified: Secondary | ICD-10-CM | POA: Diagnosis not present

## 2016-10-12 NOTE — Patient Instructions (Signed)

## 2016-10-13 DIAGNOSIS — N202 Calculus of kidney with calculus of ureter: Secondary | ICD-10-CM | POA: Diagnosis not present

## 2016-10-13 DIAGNOSIS — C61 Malignant neoplasm of prostate: Secondary | ICD-10-CM | POA: Diagnosis not present

## 2016-10-15 ENCOUNTER — Other Ambulatory Visit (HOSPITAL_COMMUNITY): Payer: Self-pay | Admitting: Urology

## 2016-10-15 DIAGNOSIS — C61 Malignant neoplasm of prostate: Secondary | ICD-10-CM

## 2016-10-20 ENCOUNTER — Encounter: Payer: Self-pay | Admitting: Radiation Oncology

## 2016-11-03 ENCOUNTER — Encounter (HOSPITAL_COMMUNITY)
Admission: RE | Admit: 2016-11-03 | Discharge: 2016-11-03 | Disposition: A | Discharge status: Home / Self Care | Payer: 59 | Source: Ambulatory Visit | Attending: Urology | Admitting: Urology

## 2016-11-03 DIAGNOSIS — C61 Malignant neoplasm of prostate: Secondary | ICD-10-CM

## 2016-11-03 MED ORDER — AXUMIN (FLUCICLOVINE F 18) INJECTION
10.6500 | Freq: Once | INTRAVENOUS | Status: AC
  Administered 2016-11-03: 10.65 via INTRAVENOUS

## 2016-11-19 ENCOUNTER — Telehealth: Payer: Self-pay | Admitting: Medical Oncology

## 2016-11-19 NOTE — Telephone Encounter (Signed)
I called pt and spoke with his wife to introduce myself as the Prostate Nurse Navigator and the Coordinator of the Prostate [place].  1. I confirmed with the patient he is aware of his referral to the clinic 12/01/16 arriving at 12:30 pm.  2. I discussed the format of the clinic and the physicians he will be seeing that day.  3. I discussed where the clinic is located and how to contact me.  4. I confirmed his address and informed him I would be mailing a packet of information and forms to be completed. I asked him to bring them with him the day of his appointment.   He voiced understanding of the above. I asked him to call me if he has any questions or concerns regarding his appointments or the forms he needs to complete.

## 2016-11-24 ENCOUNTER — Encounter: Payer: Self-pay | Admitting: Medical Oncology

## 2016-11-24 DIAGNOSIS — C61 Malignant neoplasm of prostate: Secondary | ICD-10-CM | POA: Diagnosis not present

## 2016-11-27 ENCOUNTER — Encounter: Payer: Self-pay | Admitting: Radiation Oncology

## 2016-11-27 NOTE — Progress Notes (Signed)
GU Location of Tumor / Histology: prostatic adenocarcinoma s/p prostatectomy with multiple positive margins  If Prostate Cancer, Gleason Score is (4 + 3) and PSA is (13.7) pre treatment. PSA on 10/13/2016 8.45.   Xavier Goodwin had a prostatectomy on 07/02/2016    Past/Anticipated interventions by urology, if any: prostate biopsy, lithotripsy, prostatectomy, referral to [facility]  Past/Anticipated interventions by medical oncology, if any: no  Weight changes, if any: no  Bowel/Bladder complaints, if any: Denies leakage, incontinence, or hematuria. Reports frequency and urgency.    Nausea/Vomiting, if any: no  Pain issues, if any:  no  SAFETY ISSUES:  Prior radiation? no  Pacemaker/ICD? no  Possible current pregnancy? no  Is the patient on methotrexate? no  Current Complaints / other details:  56 year old male. Married. Sister with lung ca. PET scan negative for bony disease.

## 2016-11-30 ENCOUNTER — Telehealth: Payer: Self-pay | Admitting: Medical Oncology

## 2016-11-30 NOTE — Telephone Encounter (Signed)
Spoke with Mr. Terrio to confirm appointment for Prostate [facility] 12/01/16 arriving 12:45 pm. I reviewed the registration process, valet parking and reminded him to bring his completed medical forms. He voiced understanding of the above.

## 2016-11-30 NOTE — Progress Notes (Signed)
Requested pathology slides from [facility] for Prostate [place].

## 2016-12-01 ENCOUNTER — Encounter: Payer: Self-pay | Admitting: Medical Oncology

## 2016-12-01 ENCOUNTER — Encounter: Payer: Self-pay | Admitting: Radiation Oncology

## 2016-12-01 ENCOUNTER — Ambulatory Visit
Admission: RE | Admit: 2016-12-01 | Discharge: 2016-12-01 | Disposition: A | Discharge status: Home / Self Care | Payer: 59 | Source: Ambulatory Visit | Attending: Radiation Oncology | Admitting: Radiation Oncology

## 2016-12-01 VITALS — BP 114/73 | HR 76 | Temp 98.0°F | Resp 18 | Ht 73.0 in | Wt 232.0 lb

## 2016-12-01 DIAGNOSIS — Z87442 Personal history of urinary calculi: Secondary | ICD-10-CM | POA: Diagnosis not present

## 2016-12-01 DIAGNOSIS — Z510 Encounter for antineoplastic radiation therapy: Secondary | ICD-10-CM | POA: Insufficient documentation

## 2016-12-01 DIAGNOSIS — Z87891 Personal history of nicotine dependence: Secondary | ICD-10-CM | POA: Insufficient documentation

## 2016-12-01 DIAGNOSIS — Z833 Family history of diabetes mellitus: Secondary | ICD-10-CM | POA: Insufficient documentation

## 2016-12-01 DIAGNOSIS — C61 Malignant neoplasm of prostate: Secondary | ICD-10-CM | POA: Insufficient documentation

## 2016-12-01 DIAGNOSIS — D573 Sickle-cell trait: Secondary | ICD-10-CM | POA: Diagnosis not present

## 2016-12-01 DIAGNOSIS — Z7982 Long term (current) use of aspirin: Secondary | ICD-10-CM | POA: Insufficient documentation

## 2016-12-01 DIAGNOSIS — Z8701 Personal history of pneumonia (recurrent): Secondary | ICD-10-CM | POA: Insufficient documentation

## 2016-12-01 DIAGNOSIS — E7800 Pure hypercholesterolemia, unspecified: Secondary | ICD-10-CM | POA: Insufficient documentation

## 2016-12-01 DIAGNOSIS — Z9079 Acquired absence of other genital organ(s): Secondary | ICD-10-CM | POA: Diagnosis not present

## 2016-12-01 DIAGNOSIS — R9721 Rising PSA following treatment for malignant neoplasm of prostate: Secondary | ICD-10-CM | POA: Diagnosis not present

## 2016-12-01 DIAGNOSIS — Z87440 Personal history of urinary (tract) infections: Secondary | ICD-10-CM | POA: Insufficient documentation

## 2016-12-01 DIAGNOSIS — Z79899 Other long term (current) drug therapy: Secondary | ICD-10-CM | POA: Insufficient documentation

## 2016-12-01 DIAGNOSIS — Z8249 Family history of ischemic heart disease and other diseases of the circulatory system: Secondary | ICD-10-CM | POA: Insufficient documentation

## 2016-12-01 HISTORY — DX: Malignant neoplasm of prostate: C61

## 2016-12-01 NOTE — Consult Note (Signed)
Multi-Disciplinary Clinic     12/01/2016     --------------------------------------------------------------------------------     Adolphus Birchwood. Melucci   MRN: [id number]  PRIMARY CARE:  Donavan Burnet, MD   DOB: [birth year], 56 year old Male  REFERRING:  L [name], MD   [phone]  PROVIDER:  Raynelle Bring, M.D.     LOCATION:  [facility] [phone]     --------------------------------------------------------------------------------     CC/HPI: Prostate cancer     Mr. Erhard is a 56 year old gentleman (history of hyperlipidemia and mild CHF - EF 48%) who initially presented with a PSA of 13.7 and was found to have Gleason 4+3=7 adenocarcinoma of the prostate in 3 out of 12 biopsy cores on a TRUS biopsy in February 2018. He did undergo standard staging studies with a bone scan and CT of the pelvis that were negative. He elected primary therapy with surgery and underwent a robotic prostatectomy and BPLND on 07/02/16. His pathology demonstrated a pT3a N0 Mx, Gleason 4+3=7 adenocarcinoma with positive surgical margins (bilateral posterior base). His initial postoperative course was interrupted by a an episode of an obstructing ureteral stone and sepsis requiring urgent ureteral stenting and subsequent ureteroscopic treatment. His first postoperative PSA in early July 2018 was 7.26 and was 8.41 when repeated a week later for confirmatory purposes. He underwent a fluciclovine PET scan on 11/03/16 that demonstrated an isolated left pelvic lymph node with uptake suggestive of residual disease but no other areas of concern. He presents today for a multidisciplinary discussion regarding treatment options.        ALLERGIES: No Known Allergies       MEDICATIONS: Aspirin 81 mg tablet, chewable   Sildenafil 20 mg tablet 1-5 tablets PO PRN as directed   Rosuvastatin Calcium 20 mg tablet        GU PSH: Cysto Remove Stent FB Sim - 09/02/2016  Cystoscopy Insert Stent, Right -  08/10/2016  ESWL - 08/24/2016, Amalia Hailey - 2006  Laparoscopy; Lymphadenectomy - 07/02/2016  Locm 300-399Mg /Ml Iodine,1Ml - 06/03/2016  Prostate Needle Biopsy - 05/18/2016  Robotic Radical Prostatectomy - 07/02/2016       NON-GU PSH: Colonoscopy - 2016  Elbow Surgery (Unspecified), Left - 1976  Surgical Pathology, Gross And Microscopic Examination For Prostate Needle - 05/18/2016       GU PMH: Renal calculus (Stable) - 08/18/2016  Ureteral calculus - 08/18/2016  ED due to arterial insufficiency - 07/14/2016  Stress Incontinence - 07/14/2016  Prostate Cancer - 07/09/2016, - 05/25/2016  Urinary Frequency - 04/20/2016  Urinary Urgency - 04/20/2016         PMH Notes:     1) Prostate cancer: He is s/p a BNS RAL radical prostatectomy and BPLND on 07/02/16.     Diagnosis: pT3a N0 Mx, Gleason 4+3=7 adenocarcinoma with positive surgical margins (bilateral posterior base)   Pretreatment PSA: 13.7   Pretreatment SHIM score: 23     2) Urolithiasis: He presented in May 2018 with an obstructing left ureteral stone and infection.     May 2018: Left ureteral stent for 7 mm stone and infection   May 2018: Left ESWL     NON-GU PMH: Other lack of coordination - 06/22/2016  Muscle weakness (generalized) - 06/08/2016  Hypercholesterolemia  Sickle-cell trait       FAMILY HISTORY: Atrial Fibrillation - Mother  Congestive Heart Failure - Mother  Diabetes - Mother  father deceased at age 68 - Father  Heart Attack -  Mother, Father  Hypertension - Father, Mother  kidney disease - Mother  Lung Cancer - Sister  lupus - Sister  sickle cell trait - Runs in Family     SOCIAL HISTORY: Marital Status: Married  Preferred Language: English; Race: Black or African American  Current Smoking Status: Patient smokes. Has smoked since 04/06/1986. Smokes 1 pack per day.     Tobacco Use Assessment Completed: Used Tobacco in last 30 days?  Has never drank.   Does not drink caffeine.  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       REVIEW OF SYSTEMS:     GU Review Male:   Patient denies frequent urination, hard to postpone urination, burning/ pain with urination, get up at night to urinate, leakage of urine, stream starts and stops, trouble starting your streams, and have to strain to urinate .   Gastrointestinal (Lower):   Patient denies diarrhea and constipation.   Gastrointestinal (Upper):   Patient denies nausea and vomiting.   Constitutional:   Patient denies fever, night sweats, weight loss, and fatigue.   Skin:   Patient denies skin rash/ lesion and itching.   Eyes:   Patient denies blurred vision and double vision.   Ears/ Nose/ Throat:   Patient denies sinus problems and sore throat.   Hematologic/Lymphatic:   Patient denies swollen glands and easy bruising.   Cardiovascular:   Patient denies leg swelling and chest pains.   Respiratory:   Patient denies cough and shortness of breath.   Endocrine:   Patient denies excessive thirst.   Musculoskeletal:   Patient denies back pain and joint pain.   Neurological:   Patient denies headaches and dizziness.   Psychologic:   Patient denies depression and anxiety.     VITAL SIGNS: None     MULTI-SYSTEM PHYSICAL EXAMINATION:     Constitutional: Well-nourished. No physical deformities. Normally developed. Good grooming.        PAST DATA REVIEWED:   Source Of History:  Patient   Lab Test Review:   PSA   Records Review:   Pathology Reports   X-Ray Review: Outside Nuclear Medicine: Reviewed Films. Flucicolovine PET  C.T. Abdomen/Pelvis: Reviewed Films.   Bone Scan: Reviewed Films.       10/13/16 10/06/16 04/20/16   PSA   Total PSA 8.41 ng/mL 7.26 ng/mL 13.70 ng/dl       PROCEDURES: None     ASSESSMENT:       ICD-10 Details   1 GU:   Prostate Cancer - C61      PLAN:             Document  Letter(s):  Created for Patient: Clinical Summary            Notes:   1. Metastatic prostate cancer: I reviewed his fluciclovine PET scan results with him  and his wife today. He appears to have only one obvious area of uptake in a left common illiac lymph node but remains at high risk for local recurrence as well considering his very high risk, locally advanced radical prostatectomy pathology. Although he is certainly at risk for having systemic disease based on his high PSA, he is relatively young and does wish to be aggressive. I have discussed options with him and with Dr. Tammi Klippel and we have discussed the data regarding treatment of oligometastatic disease that has emerged over the past few years.     Ultimately, Mr. Hashem and his wife wish to be aggressive about treatment.  He is interested in radiation therapy to the pelvis with specific treatment of the left sided lymph node of concern on his PET scan. He will also be treated with systemic therapy with androgen deprivation for 18-24 months. We have reviewed the potential side effects and risks of treatment and he gives consent to proceed.     He will begin ADT in the near future with Lupron 45 mg. He will then plan to proceed with radiation and he will f/u with me in 6 months with a PSA at that time.     CC: Dr. Tyler Pita   Dr. [name]       E & M CODE: I spent at least 28 minutes face to face with the patient, more than 50% of that time was spent on counseling and/or coordinating care.

## 2016-12-01 NOTE — Progress Notes (Signed)
                               Care Plan Summary  Name: [name] DOB: Apr 11, 1960   Your Medical Team:   Urologist -  Dr. Raynelle Bring, [facility]  Radiation Oncologist - Dr. Tyler Pita, [place]     Recommendations: 1) Androgen Deprivation/hormone injection  2) Radiation   * These recommendations are based on information available as of today's consult.      Recommendations may change depending on the results of further tests or exams.  Next Steps: 1) Dr. Lynne Logan office will schedule hormone injection 2) Dr. Johny Shears office will schedule radiation  When appointments need to be scheduled, you will be contacted by [facility] and/or [facility].  Questions?  Please do not hesitate to call Cira Rue, RN, BSN, OCN at [phone] any questions or concerns.  Shirlean Mylar is your Oncology Nurse Navigator and is available to assist you while you're receiving your medical care at [facility].  Patient was provided with  business cards for all team members and a copy of "[place]".

## 2016-12-01 NOTE — Progress Notes (Signed)
Radiation Oncology         [phone] ________________________________  Multidisciplinary Prostate Cancer Clinic  Initial Radiation Oncology Consultation  Name: Xavier Goodwin MRN: [id number]  Date: 12/01/2016  DOB: 10/17/1960  [id number], L.Marlou Sa, MD  Raynelle Bring, MD   REFERRING PHYSICIAN: Raynelle Bring, MD  DIAGNOSIS: 56 y.o. gentleman with a history of stage pT3a adenocarcinoma of the prostate with a Gleason's score of 4+3 and a pretreatment PSA of 13.7, status post BNS RALP on 07/02/16 with multiple positive surgical margins and detectable post op PSA of 9.87.    ICD-10-CM   1. Prostate cancer ([place]) C61     HISTORY OF PRESENT ILLNESS::Xavier Goodwin is a 56 y.o. gentleman with a history of pT3aN0M0, Gleason 4+3=7 adenocarcinoma of the prostate, s/p BNS RALP on 07/02/16 with multiple positive surgical margins. His pretreatment PSA was 13.7. CT Pelvis 06/03/16 for disease staging showed small to borderline enlarged left pelvic lymph nodes and Bone Scan was without evidence of osseous metastatic disease. Initial post-treatment PSA on 10/06/16 was 7.26, repeat PSA 10/13/16 was 8.41 and most recent PSA on 11/24/16 was 9.87.    PSA 11/24/16- 9.87 10/13/16- 8.41 10/06/16- 7.26 04/20/16- 13.70 01/30/13- 1.10 01/29/12- 1.09  Surgical Pathology Report: 1. Lymph nodes, regional resection, right pelvic - SIX OF SIX LYMPH NODES NEGATIVE FOR CARCINOMA (0/6). 2. Lymph nodes, regional resection, left pelvic - TWO OF TWO LYMPH NODES NEGATIVE FOR CARCINOMA (0/2). 3. Prostate, radical resection - PROSTATIC ADENOCARCINOMA, GLEASON SCORE 4+3=7. - TUMOR INVOLVES BOTH LOBES. - EXTRAPROSTATIC EXTENSION PRESENT. - BILATERAL POSTERIOR BASE RESECTION MARGIN IS POSITIVE. - PERINEURAL INVASION PRESENT. - SEE ONCOLOGY TABLE. Microscopic Comment 3. PROSTATE RADICAL RESECTION/PROSTATECTOMY Procedure: Prostatectomy. Prostate gland: Weight: 66 gm. Dimension: 4.6 x 4.5 x 4.3 cm. Histologic type:  Prostatic adenocarcinoma, acinar type. Gleasons Score: 4+3=7. (Per Dr. Windy Fast review at conference, there are areas of 4+4 and some 5's). Tertiary score (if applicable): N/A. Involving (half lobe or less, one or both lobes): Both lobes Estimated proportion (%) of prostate tissue involved by tumor: 5-10%. Extraprostatic extension: Present (pattern 4, posterior midline bilaterally at base). Involvement of apex: Not identified. Margins: Positive. (pattern 4, posterior midline bilaterally at base). Seminal vesicles: Uninvolved. (Per Dr. Windy Fast review at conference, SVs were involved) Lymph-Vascular invasion: Not identified. Treatment effect: N/A. Lymph nodes: number examined 8 ; number positive 0  He had an Axumin PET scan on 11/03/16 for further evaluation and disease staging. This did show intense radiotracer accumulation within a 67mm left pelvic lymph node that is medial to the psoas muscle and ventral to the common iliac vessels with an SUV max of 12. There was mild hypermetabolic activity associated with a 37mm left external iliac lymph node (SUV 3.2). There was no evidence of local recurrence in the prostate bed or distant metastatic disease. No skeletal metastasis.  The patient reviewed the surgical pathology results and recent Axumin PET scan results with his urologist and he has kindly been referred today to the multidisciplinary prostate cancer clinic for presentation of pathology and radiology studies in our conference for discussion of potential radiation treatment options and clinical evaluation.  PREVIOUS RADIATION THERAPY: No  PAST MEDICAL HISTORY:  has a past medical history of Chest pain; Cholelithiasis (08/2016); History of bronchitis; History of kidney stones; History of urinary tract infection; Hypercholesteremia; Pneumonia; Prostate cancer ([place]); and Sepsis ([place]) (08/2016).    PAST SURGICAL HISTORY: Past Surgical History:  Procedure Laterality Date  . CARDIAC CATHETERIZATION  N/A 11/25/2015   Procedure:  Left Heart Cath and Coronary Angiography;  Surgeon: Josue Hector, MD;  Location: [facility];  Service: Cardiovascular;  Laterality: N/A;  . CYSTOSCOPY W/ URETERAL STENT PLACEMENT Right 08/10/2016   Procedure: CYSTOSCOPY WITH RETROGRADE PYELOGRAM RIGHT URETERAL STENT PLACEMENT;  Surgeon: Irine Seal, MD;  Location: [facility];  Service: Urology;  Laterality: Right;  . EXTRACORPOREAL SHOCK WAVE LITHOTRIPSY Right 08/24/2016   Procedure: RIGHT EXTRACORPOREAL SHOCK WAVE LITHOTRIPSY (ESWL);  Surgeon: Kathie Rhodes, MD;  Location: [facility];  Service: Urology;  Laterality: Right;  . PELVIC LYMPH NODE DISSECTION Bilateral 07/02/2016   Procedure: BILATERAL PELVIC LYMPH NODE DISSECTION;  Surgeon: Raynelle Bring, MD;  Location: [facility];  Service: Urology;  Laterality: Bilateral;  . ROBOT ASSISTED LAPAROSCOPIC RADICAL PROSTATECTOMY N/A 07/02/2016   Procedure: XI ROBOTIC ASSISTED LAPAROSCOPIC RADICAL PROSTATECTOMY LEVEL 2;  Surgeon: Raynelle Bring, MD;  Location: [facility];  Service: Urology;  Laterality: N/A;    FAMILY HISTORY: family history includes Cancer in his mother; Diabetes in his mother; Heart attack in his father; Hypertension in his father; Lupus in his sister.  SOCIAL HISTORY:  reports that he quit smoking about 3 months ago. His smoking use included Cigarettes. He has a 30.00 pack-year smoking history. He has never used smokeless tobacco. He reports that he does not drink alcohol or use drugs. He is retired.   ALLERGIES: Patient has no known allergies.  MEDICATIONS:  Current Outpatient Prescriptions  Medication Sig Dispense Refill  . aspirin EC 81 MG tablet Take 81 mg by mouth daily.    . rosuvastatin (CRESTOR) 20 MG tablet Take 20 mg by mouth daily.     No current facility-administered medications for this encounter.     REVIEW OF SYSTEMS:  On review of systems, the patient reports that he is doing well overall. He denies any chest pain, shortness of breath, cough,  fevers, chills, night sweats, unintended weight changes. He denies any bowel disturbances, and denies abdominal pain, nausea or vomiting. He denies any new musculoskeletal or joint aches or pains. His IPSS was 16, indicating moderate urinary symptoms. He has regained full urinary continence post prostatectomy.  He denies gross hematuria, dysuria, frequency, urgency or excessive nocturia. He is unable to complete sexual activity with a SHIM score of 9, having moderate ED. A complete review of systems is obtained and is otherwise negative.   PHYSICAL EXAM:  Vitals:   12/01/16 1525  BP: 114/73  Pulse: 76  Resp: 18  Temp: 98 F (36.7 C)  SpO2: 100%     In general this is a well appearing  in no acute distress. He is alert and oriented x4 and appropriate throughout the examination. HEENT reveals that the patient is normocephalic, atraumatic. EOMs are intact. PERRLA. Skin is intact without any evidence of gross lesions. Cardiovascular exam reveals a regular rate and rhythm, no clicks rubs or murmurs are auscultated. Chest is clear to auscultation bilaterally. Lymphatic assessment is performed and does not reveal any adenopathy in the cervical, supraclavicular, axillary, or inguinal chains. Abdomen has active bowel sounds in all quadrants and is intact. The abdomen is soft, non tender, non distended. Lower extremities are negative for pretibial pitting edema, deep calf tenderness, cyanosis or clubbing.  KPS = 100  100 - Normal; no complaints; no evidence of disease. 90   - Able to carry on normal activity; minor signs or symptoms of disease. 80   - Normal activity with effort; some signs or symptoms of disease. 70   - Cares  for self; unable to carry on normal activity or to do active work. 60   - Requires occasional assistance, but is able to care for most of his personal needs. 50   - Requires considerable assistance and frequent medical care. 37   - Disabled; requires special care and  assistance. 68   - Severely disabled; hospital admission is indicated although death not imminent. 40   - Very sick; hospital admission necessary; active supportive treatment necessary. 10   - Moribund; fatal processes progressing rapidly. 0     - Dead  Karnofsky DA, Abelmann Miner, Craver LS and Burchenal [facility] [phone]) The use of the nitrogen mustards in the palliative treatment of carcinoma: with particular reference to bronchogenic carcinoma Cancer 1 [phone]   LABORATORY DATA:  Lab Results  Component Value Date   WBC 13.6 (H) 08/12/2016   HGB 9.6 (L) 08/12/2016   HCT 26.3 (L) 08/12/2016   MCV 90.1 08/12/2016   PLT 112 (L) 08/12/2016   Lab Results  Component Value Date   NA 140 08/12/2016   K 3.4 (L) 08/12/2016   CL 111 08/12/2016   CO2 22 08/12/2016   Lab Results  Component Value Date   ALT 22 08/12/2016   AST 21 08/12/2016   ALKPHOS 77 08/12/2016   BILITOT 2.1 (H) 08/12/2016     RADIOGRAPHY: Nm Pet (axumin) Skull Base To Mid Thigh  Result Date: 11/04/2016 CLINICAL DATA:  Prostate carcinoma with biochemical recurrence. Radical prostatectomy 07/02/2016. PSA = 7.26 10/06/2016. EXAM: NUCLEAR MEDICINE PET SKULL BASE TO THIGH TECHNIQUE: 10.8 mCi F-18 Fluciclovine was injected intravenously. Full-ring PET imaging was performed from the skull base to thigh after the radiotracer. CT data was obtained and used for attenuation correction and anatomic localization. COMPARISON:  CT 06/03/2016, 08/10/2016.  Bone scan 05/26/2016 FINDINGS: NECK No radiotracer activity in neck lymph nodes. CHEST No radiotracer accumulation within mediastinal or hilar lymph nodes. No suspicious pulmonary nodules on the CT scan. ABDOMEN/PELVIS No focal radiotracer activity within the prostatectomy bed. Intense radiotracer accumulation within the LEFT pelvic lymph node medial to the LEFT psoas muscle and ventral to the distal common iliac vessels measuring 10 mm (image 166, series 4). The activity is intense with SUV max  equal 12.0. Mild radiotracer activity associated with a LEFT external iliac lymph node measuring 10 mm (image 24, series 4) with SUV max equal 3.2. This activity is similar to background marrow activity. No more superior periaortic retroperitoneal lymph node activity. No abnormal activity in the liver. Incidental note of a large gallstone and nonobstructing RIGHT renal calculus. Physiologic activity noted within the liver and pancreas. SKELETON No focal  activity to suggest skeletal metastasis. IMPRESSION: 1. Intense radiotracer accumulation within a LEFT pelvic lymph node medial to the psoas muscle and ventral to the the common iliac vessels. Findings most consistent with lymph node metastasis from prostate cancer. 2. Mild activity associated with a LEFT external iliac lymph node is favored benign. 3. No evidence of local recurrence in the prostate bed. 4. No evidence of or distant metastatic disease. No skeletal metastasis. Electronically Signed   By: Suzy Bouchard M.D.   On: 11/04/2016 09:27      IMPRESSION/PLAN: 56 y.o. gentleman with stage pT2c, Gleason 3+4=7, adenocarcinoma of the prostate with a pretreatment PSA of 13.7,status post BNS RALP on 07/02/16 with multiple positive surgical margins and detectable post-treatment PSA of 9.87. Today we reviewed the findings and workup thus far.  We discussed the natural history of prostate cancer.  We reviewed the the implications of positive margins, extracapsular extension, and seminal vesicle involvement on the risk of prostate cancer recurrence.  Given that his post-operative PSA is significantly elevated at 9.87, it is highly probable that he has persistent systemic disease.  Recent Axumin PET scan indicates persistent disease in a left pelvic lymph node.  We discussed radiation treatment directed to the prostatic fossa and involved lymph nodes with regard to the logistics and delivery of external beam radiation treatment and possible side effects of the  treatment.  We recommend a course of 7.5 - 8 weeks of daily radiation treatments to the prostatic fossa and subclinical nodes with a boost to the involved pelvic lymph node. We discussed and outlined the risks, benefits, short and long-term effects associated with radiotherapy in this setting. We also detailed the role of ADT in the treatment of high grade prostate cancer and outlined the associated side effects that could be expected with this therapy.  The patient would like to proceed with salvage radiotherapy to the prostatic fossa and involved lymph nodes in combination with LT-ADT.  He will begin ADT within the next week and plan to start EBRT around the first of November. We will coordinate CT SIM to occur prior to the start of radiotherapy.  I will share my findings with Dr. Alinda Money and look forward to participating in the care of this very nice gentleman.    ------------------------------------------------    Nicholos Johns, PA-C    Tyler Pita, MD  [place]: [phone]  Fax: [phone] [email or web address]  Skype  [email or web address]   This document serves as a record of services personally performed by Tyler Pita, MD and Ashlyn Bruning PA-C. It was created on their behalf by Delton Coombes, a trained medical scribe. The creation of this record is based on the scribe's personal observations and the provider's statements to them. This document has been checked and approved by the attending provider.

## 2016-12-02 ENCOUNTER — Encounter: Payer: Self-pay | Admitting: General Practice

## 2016-12-02 NOTE — Progress Notes (Signed)
[place] Psychosocial Distress Screening Spiritual Care  Met with Xavier Goodwin and his wife Xavier Goodwin in [facility] to introduce [name] team/resources, reviewing distress screen per protocol.  The patient scored a 8 on the Psychosocial Distress Thermometer which indicates severe distress. Also assessed for distress and other psychosocial needs.   ONCBCN DISTRESS SCREENING 12/02/2016  Screening Type Initial Screening  Distress experienced in past week (1-10) 8  Emotional problem type Adjusting to illness  Information Concerns Type Lack of info about diagnosis;Lack of info about treatment;Lack of info about complementary therapy choices;Lack of info about maintaining fitness  Physical Problem type Sexual problems  Referral to support programs Yes   The [place] have experienced many challenges and stressors this year, including pt's mother's death, which has been emotionally fatiguing.  Per couple, they have not talked together very much about [name]' dx and their feelings about it.  Both became tearful as [place] initiated verbal processing.  Provided emotional support, normalizing feelings and encouraging communication together and support from outside sources too (not just each other).  Offered Spiritual Care as an ongoing resource for emotional/grief support for them as a couple or individually.  Follow up needed: Yes.  Plan to send a handwritten f/u note of encouragement and care.   [place], [place], [facility] Pager [phone] Voicemail [phone]

## 2016-12-14 ENCOUNTER — Telehealth: Payer: Self-pay | Admitting: Medical Oncology

## 2016-12-14 NOTE — Telephone Encounter (Signed)
Called Xavier Goodwin and spoke with his wife as follow up to Prostate MDC. He is doing well and is scheduled to receive Lupron 12/16/16 at [facility]. I informed her that Dr. Johny Shears office will call to schedule CT simulation for the radiation. She voiced understanding.

## 2016-12-16 DIAGNOSIS — C61 Malignant neoplasm of prostate: Secondary | ICD-10-CM | POA: Diagnosis not present

## 2016-12-16 DIAGNOSIS — Z5111 Encounter for antineoplastic chemotherapy: Secondary | ICD-10-CM | POA: Diagnosis not present

## 2017-01-01 DIAGNOSIS — Z23 Encounter for immunization: Secondary | ICD-10-CM | POA: Diagnosis not present

## 2017-01-04 DIAGNOSIS — C61 Malignant neoplasm of prostate: Secondary | ICD-10-CM | POA: Diagnosis not present

## 2017-01-04 DIAGNOSIS — Z510 Encounter for antineoplastic radiation therapy: Secondary | ICD-10-CM | POA: Diagnosis not present

## 2017-01-05 ENCOUNTER — Telehealth: Payer: Self-pay | Admitting: Medical Oncology

## 2017-01-05 NOTE — Telephone Encounter (Signed)
Shelby-wife called asking if we have patient scheduled for radiation. She is just wanting to know dates so they can plan calendar. I informed her that I will send a message to Ashlyn-PA with Dr. Tammi Klippel regarding scheduling. She voiced understanding.

## 2017-01-06 ENCOUNTER — Telehealth: Payer: Self-pay | Admitting: *Deleted

## 2017-01-06 NOTE — Telephone Encounter (Signed)
CALLED PATIENT TO INFORM OF SIM APPT. FOR 01-14-17 @ 1 PM, SPOKE WITH PATIENT'S WIFE SHELBY AND SHE IS AWARE OF THIS APPT.

## 2017-01-13 NOTE — Progress Notes (Signed)
  Radiation Oncology         [phone] ________________________________  Name: Xavier Goodwin MRN: [id number]  Date: 01/14/2017  DOB: [birth year]  SIMULATION AND TREATMENT PLANNING NOTE    ICD-10-CM   1. Prostate cancer [facility]) C61     DIAGNOSIS:  56 y.o. gentleman with a history of stage pT3a adenocarcinoma of the prostate with a Gleason's score of 4+3 and a pretreatment PSA of 13.7, status post BNS RALP on 07/02/16 with multiple positive surgical margins and detectable post op PSA of 9.87.  NARRATIVE:  The patient was brought to the [place].  Identity was confirmed.  All relevant records and images related to the planned course of therapy were reviewed.  The patient freely provided informed written consent to proceed with treatment after reviewing the details related to the planned course of therapy. The consent form was witnessed and verified by the simulation staff.  Then, the patient was set-up in a stable reproducible supine position for radiation therapy.  A vacuum lock pillow device was custom fabricated to position his legs in a reproducible immobilized position.  Then, I performed a urethrogram under sterile conditions to identify the prostatic apex.  CT images were obtained.  Surface markings were placed.  The CT images were loaded into the planning software.  Then the prostate target and avoidance structures including the rectum, bladder, bowel and hips were contoured.  Treatment planning then occurred.  The radiation prescription was entered and confirmed.  A total of one complex treatment devices was fabricated. I have requested : Intensity Modulated Radiotherapy (IMRT) is medically necessary for this case for the following reason:  Rectal sparing.Marland Kitchen  PLAN:  The patient will receive 45 Gy to the pelvis then a boost of to 68.4 Gy to the prostatic fossa all in 38 fractions.  ________________________________  Sheral Apley Tammi Klippel, M.D.

## 2017-01-14 ENCOUNTER — Encounter: Payer: Self-pay | Admitting: Medical Oncology

## 2017-01-14 ENCOUNTER — Ambulatory Visit
Admission: RE | Admit: 2017-01-14 | Discharge: 2017-01-14 | Disposition: A | Discharge status: Home / Self Care | Payer: 59 | Source: Ambulatory Visit | Attending: Radiation Oncology | Admitting: Radiation Oncology

## 2017-01-14 DIAGNOSIS — C61 Malignant neoplasm of prostate: Secondary | ICD-10-CM

## 2017-01-14 DIAGNOSIS — Z510 Encounter for antineoplastic radiation therapy: Secondary | ICD-10-CM | POA: Diagnosis not present

## 2017-01-22 DIAGNOSIS — C61 Malignant neoplasm of prostate: Secondary | ICD-10-CM | POA: Diagnosis not present

## 2017-01-22 DIAGNOSIS — Z510 Encounter for antineoplastic radiation therapy: Secondary | ICD-10-CM | POA: Diagnosis not present

## 2017-01-25 ENCOUNTER — Ambulatory Visit
Admission: RE | Admit: 2017-01-25 | Discharge: 2017-01-25 | Disposition: A | Discharge status: Home / Self Care | Payer: 59 | Source: Ambulatory Visit | Attending: Radiation Oncology | Admitting: Radiation Oncology

## 2017-01-25 DIAGNOSIS — C61 Malignant neoplasm of prostate: Secondary | ICD-10-CM | POA: Diagnosis not present

## 2017-01-25 DIAGNOSIS — Z510 Encounter for antineoplastic radiation therapy: Secondary | ICD-10-CM | POA: Diagnosis not present

## 2017-01-26 ENCOUNTER — Ambulatory Visit
Admission: RE | Admit: 2017-01-26 | Discharge: 2017-01-26 | Disposition: A | Discharge status: Home / Self Care | Payer: 59 | Source: Ambulatory Visit | Attending: Radiation Oncology | Admitting: Radiation Oncology

## 2017-01-26 DIAGNOSIS — C61 Malignant neoplasm of prostate: Secondary | ICD-10-CM | POA: Diagnosis not present

## 2017-01-26 DIAGNOSIS — Z510 Encounter for antineoplastic radiation therapy: Secondary | ICD-10-CM | POA: Diagnosis not present

## 2017-01-27 ENCOUNTER — Ambulatory Visit
Admission: RE | Admit: 2017-01-27 | Discharge: 2017-01-27 | Disposition: A | Discharge status: Home / Self Care | Payer: 59 | Source: Ambulatory Visit | Attending: Radiation Oncology | Admitting: Radiation Oncology

## 2017-01-27 DIAGNOSIS — C61 Malignant neoplasm of prostate: Secondary | ICD-10-CM | POA: Diagnosis not present

## 2017-01-27 DIAGNOSIS — Z510 Encounter for antineoplastic radiation therapy: Secondary | ICD-10-CM | POA: Diagnosis not present

## 2017-01-28 ENCOUNTER — Ambulatory Visit
Admission: RE | Admit: 2017-01-28 | Discharge: 2017-01-28 | Disposition: A | Discharge status: Home / Self Care | Payer: 59 | Source: Ambulatory Visit | Attending: Radiation Oncology | Admitting: Radiation Oncology

## 2017-01-28 DIAGNOSIS — C61 Malignant neoplasm of prostate: Secondary | ICD-10-CM | POA: Diagnosis not present

## 2017-01-28 DIAGNOSIS — Z510 Encounter for antineoplastic radiation therapy: Secondary | ICD-10-CM | POA: Diagnosis not present

## 2017-01-29 ENCOUNTER — Ambulatory Visit
Admission: RE | Admit: 2017-01-29 | Discharge: 2017-01-29 | Disposition: A | Discharge status: Home / Self Care | Payer: 59 | Source: Ambulatory Visit | Attending: Radiation Oncology | Admitting: Radiation Oncology

## 2017-01-29 DIAGNOSIS — C61 Malignant neoplasm of prostate: Secondary | ICD-10-CM | POA: Diagnosis not present

## 2017-01-29 DIAGNOSIS — Z510 Encounter for antineoplastic radiation therapy: Secondary | ICD-10-CM | POA: Diagnosis not present

## 2017-02-01 ENCOUNTER — Ambulatory Visit
Admission: RE | Admit: 2017-02-01 | Discharge: 2017-02-01 | Disposition: A | Discharge status: Home / Self Care | Payer: 59 | Source: Ambulatory Visit | Attending: Radiation Oncology | Admitting: Radiation Oncology

## 2017-02-01 DIAGNOSIS — Z510 Encounter for antineoplastic radiation therapy: Secondary | ICD-10-CM | POA: Diagnosis not present

## 2017-02-01 DIAGNOSIS — C61 Malignant neoplasm of prostate: Secondary | ICD-10-CM | POA: Diagnosis not present

## 2017-02-02 ENCOUNTER — Ambulatory Visit
Admission: RE | Admit: 2017-02-02 | Discharge: 2017-02-02 | Disposition: A | Discharge status: Home / Self Care | Payer: 59 | Source: Ambulatory Visit | Attending: Radiation Oncology | Admitting: Radiation Oncology

## 2017-02-02 DIAGNOSIS — Z510 Encounter for antineoplastic radiation therapy: Secondary | ICD-10-CM | POA: Diagnosis not present

## 2017-02-02 DIAGNOSIS — C61 Malignant neoplasm of prostate: Secondary | ICD-10-CM | POA: Diagnosis not present

## 2017-02-03 ENCOUNTER — Ambulatory Visit
Admission: RE | Admit: 2017-02-03 | Discharge: 2017-02-03 | Disposition: A | Discharge status: Home / Self Care | Payer: 59 | Source: Ambulatory Visit | Attending: Radiation Oncology | Admitting: Radiation Oncology

## 2017-02-03 DIAGNOSIS — Z510 Encounter for antineoplastic radiation therapy: Secondary | ICD-10-CM | POA: Diagnosis not present

## 2017-02-03 DIAGNOSIS — C61 Malignant neoplasm of prostate: Secondary | ICD-10-CM | POA: Diagnosis not present

## 2017-02-04 ENCOUNTER — Ambulatory Visit
Admission: RE | Admit: 2017-02-04 | Discharge: 2017-02-04 | Disposition: A | Discharge status: Home / Self Care | Payer: 59 | Source: Ambulatory Visit | Attending: Radiation Oncology | Admitting: Radiation Oncology

## 2017-02-04 DIAGNOSIS — C61 Malignant neoplasm of prostate: Secondary | ICD-10-CM | POA: Diagnosis not present

## 2017-02-04 DIAGNOSIS — Z510 Encounter for antineoplastic radiation therapy: Secondary | ICD-10-CM | POA: Diagnosis not present

## 2017-02-05 ENCOUNTER — Ambulatory Visit
Admission: RE | Admit: 2017-02-05 | Discharge: 2017-02-05 | Disposition: A | Discharge status: Home / Self Care | Payer: 59 | Source: Ambulatory Visit | Attending: Radiation Oncology | Admitting: Radiation Oncology

## 2017-02-05 DIAGNOSIS — Z510 Encounter for antineoplastic radiation therapy: Secondary | ICD-10-CM | POA: Diagnosis not present

## 2017-02-05 DIAGNOSIS — C61 Malignant neoplasm of prostate: Secondary | ICD-10-CM | POA: Diagnosis not present

## 2017-02-08 ENCOUNTER — Ambulatory Visit
Admission: RE | Admit: 2017-02-08 | Discharge: 2017-02-08 | Disposition: A | Discharge status: Home / Self Care | Payer: 59 | Source: Ambulatory Visit | Attending: Radiation Oncology | Admitting: Radiation Oncology

## 2017-02-08 DIAGNOSIS — Z510 Encounter for antineoplastic radiation therapy: Secondary | ICD-10-CM | POA: Diagnosis not present

## 2017-02-08 DIAGNOSIS — C61 Malignant neoplasm of prostate: Secondary | ICD-10-CM | POA: Diagnosis not present

## 2017-02-09 ENCOUNTER — Ambulatory Visit
Admission: RE | Admit: 2017-02-09 | Discharge: 2017-02-09 | Disposition: A | Discharge status: Home / Self Care | Payer: 59 | Source: Ambulatory Visit | Attending: Radiation Oncology | Admitting: Radiation Oncology

## 2017-02-09 DIAGNOSIS — Z510 Encounter for antineoplastic radiation therapy: Secondary | ICD-10-CM | POA: Diagnosis not present

## 2017-02-09 DIAGNOSIS — C61 Malignant neoplasm of prostate: Secondary | ICD-10-CM | POA: Diagnosis not present

## 2017-02-10 ENCOUNTER — Ambulatory Visit
Admission: RE | Admit: 2017-02-10 | Discharge: 2017-02-10 | Disposition: A | Discharge status: Home / Self Care | Payer: 59 | Source: Ambulatory Visit | Attending: Radiation Oncology | Admitting: Radiation Oncology

## 2017-02-10 DIAGNOSIS — C61 Malignant neoplasm of prostate: Secondary | ICD-10-CM | POA: Diagnosis not present

## 2017-02-10 DIAGNOSIS — Z510 Encounter for antineoplastic radiation therapy: Secondary | ICD-10-CM | POA: Diagnosis not present

## 2017-02-11 ENCOUNTER — Ambulatory Visit
Admission: RE | Admit: 2017-02-11 | Discharge: 2017-02-11 | Disposition: A | Discharge status: Home / Self Care | Payer: 59 | Source: Ambulatory Visit | Attending: Radiation Oncology | Admitting: Radiation Oncology

## 2017-02-11 DIAGNOSIS — Z510 Encounter for antineoplastic radiation therapy: Secondary | ICD-10-CM | POA: Diagnosis not present

## 2017-02-11 DIAGNOSIS — C61 Malignant neoplasm of prostate: Secondary | ICD-10-CM | POA: Diagnosis not present

## 2017-02-12 ENCOUNTER — Ambulatory Visit
Admission: RE | Admit: 2017-02-12 | Discharge: 2017-02-12 | Disposition: A | Discharge status: Home / Self Care | Payer: 59 | Source: Ambulatory Visit | Attending: Radiation Oncology | Admitting: Radiation Oncology

## 2017-02-12 DIAGNOSIS — Z510 Encounter for antineoplastic radiation therapy: Secondary | ICD-10-CM | POA: Diagnosis not present

## 2017-02-12 DIAGNOSIS — C61 Malignant neoplasm of prostate: Secondary | ICD-10-CM | POA: Diagnosis not present

## 2017-02-15 ENCOUNTER — Ambulatory Visit
Admission: RE | Admit: 2017-02-15 | Discharge: 2017-02-15 | Disposition: A | Discharge status: Home / Self Care | Payer: 59 | Source: Ambulatory Visit | Attending: Radiation Oncology | Admitting: Radiation Oncology

## 2017-02-15 DIAGNOSIS — C61 Malignant neoplasm of prostate: Secondary | ICD-10-CM | POA: Diagnosis not present

## 2017-02-15 DIAGNOSIS — Z510 Encounter for antineoplastic radiation therapy: Secondary | ICD-10-CM | POA: Diagnosis not present

## 2017-02-16 ENCOUNTER — Ambulatory Visit
Admission: RE | Admit: 2017-02-16 | Discharge: 2017-02-16 | Disposition: A | Discharge status: Home / Self Care | Payer: 59 | Source: Ambulatory Visit | Attending: Radiation Oncology | Admitting: Radiation Oncology

## 2017-02-16 DIAGNOSIS — Z510 Encounter for antineoplastic radiation therapy: Secondary | ICD-10-CM | POA: Diagnosis not present

## 2017-02-16 DIAGNOSIS — C61 Malignant neoplasm of prostate: Secondary | ICD-10-CM | POA: Diagnosis not present

## 2017-02-17 ENCOUNTER — Ambulatory Visit
Admission: RE | Admit: 2017-02-17 | Discharge: 2017-02-17 | Disposition: A | Discharge status: Home / Self Care | Payer: 59 | Source: Ambulatory Visit | Attending: Radiation Oncology | Admitting: Radiation Oncology

## 2017-02-17 DIAGNOSIS — C61 Malignant neoplasm of prostate: Secondary | ICD-10-CM | POA: Diagnosis not present

## 2017-02-17 DIAGNOSIS — Z510 Encounter for antineoplastic radiation therapy: Secondary | ICD-10-CM | POA: Diagnosis not present

## 2017-02-18 ENCOUNTER — Ambulatory Visit
Admission: RE | Admit: 2017-02-18 | Discharge: 2017-02-18 | Disposition: A | Discharge status: Home / Self Care | Payer: 59 | Source: Ambulatory Visit | Attending: Radiation Oncology | Admitting: Radiation Oncology

## 2017-02-18 DIAGNOSIS — C61 Malignant neoplasm of prostate: Secondary | ICD-10-CM | POA: Diagnosis not present

## 2017-02-18 DIAGNOSIS — Z510 Encounter for antineoplastic radiation therapy: Secondary | ICD-10-CM | POA: Diagnosis not present

## 2017-02-19 ENCOUNTER — Ambulatory Visit
Admission: RE | Admit: 2017-02-19 | Discharge: 2017-02-19 | Disposition: A | Discharge status: Home / Self Care | Payer: 59 | Source: Ambulatory Visit | Attending: Radiation Oncology | Admitting: Radiation Oncology

## 2017-02-19 ENCOUNTER — Encounter: Payer: Self-pay | Admitting: Medical Oncology

## 2017-02-19 DIAGNOSIS — Z510 Encounter for antineoplastic radiation therapy: Secondary | ICD-10-CM | POA: Diagnosis not present

## 2017-02-19 DIAGNOSIS — C61 Malignant neoplasm of prostate: Secondary | ICD-10-CM | POA: Diagnosis not present

## 2017-02-21 ENCOUNTER — Ambulatory Visit
Admission: RE | Admit: 2017-02-21 | Discharge: 2017-02-21 | Disposition: A | Discharge status: Home / Self Care | Payer: 59 | Source: Ambulatory Visit | Attending: Radiation Oncology | Admitting: Radiation Oncology

## 2017-02-21 DIAGNOSIS — C61 Malignant neoplasm of prostate: Secondary | ICD-10-CM | POA: Diagnosis not present

## 2017-02-21 DIAGNOSIS — Z510 Encounter for antineoplastic radiation therapy: Secondary | ICD-10-CM | POA: Diagnosis not present

## 2017-02-22 ENCOUNTER — Ambulatory Visit
Admission: RE | Admit: 2017-02-22 | Discharge: 2017-02-22 | Disposition: A | Discharge status: Home / Self Care | Payer: 59 | Source: Ambulatory Visit | Attending: Radiation Oncology | Admitting: Radiation Oncology

## 2017-02-22 DIAGNOSIS — C61 Malignant neoplasm of prostate: Secondary | ICD-10-CM | POA: Diagnosis not present

## 2017-02-22 DIAGNOSIS — Z510 Encounter for antineoplastic radiation therapy: Secondary | ICD-10-CM | POA: Diagnosis not present

## 2017-02-23 ENCOUNTER — Ambulatory Visit
Admission: RE | Admit: 2017-02-23 | Discharge: 2017-02-23 | Disposition: A | Discharge status: Home / Self Care | Payer: 59 | Source: Ambulatory Visit | Attending: Radiation Oncology | Admitting: Radiation Oncology

## 2017-02-23 DIAGNOSIS — Z510 Encounter for antineoplastic radiation therapy: Secondary | ICD-10-CM | POA: Diagnosis not present

## 2017-02-23 DIAGNOSIS — C61 Malignant neoplasm of prostate: Secondary | ICD-10-CM | POA: Diagnosis not present

## 2017-02-24 ENCOUNTER — Ambulatory Visit
Admission: RE | Admit: 2017-02-24 | Discharge: 2017-02-24 | Disposition: A | Discharge status: Home / Self Care | Payer: 59 | Source: Ambulatory Visit | Attending: Radiation Oncology | Admitting: Radiation Oncology

## 2017-02-24 DIAGNOSIS — Z510 Encounter for antineoplastic radiation therapy: Secondary | ICD-10-CM | POA: Diagnosis not present

## 2017-02-24 DIAGNOSIS — C61 Malignant neoplasm of prostate: Secondary | ICD-10-CM | POA: Diagnosis not present

## 2017-03-01 ENCOUNTER — Ambulatory Visit
Admission: RE | Admit: 2017-03-01 | Discharge: 2017-03-01 | Disposition: A | Discharge status: Home / Self Care | Payer: 59 | Source: Ambulatory Visit | Attending: Radiation Oncology | Admitting: Radiation Oncology

## 2017-03-01 DIAGNOSIS — Z510 Encounter for antineoplastic radiation therapy: Secondary | ICD-10-CM | POA: Insufficient documentation

## 2017-03-01 DIAGNOSIS — C61 Malignant neoplasm of prostate: Secondary | ICD-10-CM | POA: Diagnosis not present

## 2017-03-02 ENCOUNTER — Ambulatory Visit: Payer: 59

## 2017-03-02 ENCOUNTER — Ambulatory Visit
Admission: RE | Admit: 2017-03-02 | Discharge: 2017-03-02 | Disposition: A | Discharge status: Home / Self Care | Payer: 59 | Source: Ambulatory Visit | Attending: Radiation Oncology | Admitting: Radiation Oncology

## 2017-03-02 DIAGNOSIS — C61 Malignant neoplasm of prostate: Secondary | ICD-10-CM | POA: Diagnosis not present

## 2017-03-02 DIAGNOSIS — Z510 Encounter for antineoplastic radiation therapy: Secondary | ICD-10-CM | POA: Diagnosis not present

## 2017-03-03 ENCOUNTER — Ambulatory Visit
Admission: RE | Admit: 2017-03-03 | Discharge: 2017-03-03 | Disposition: A | Discharge status: Home / Self Care | Payer: 59 | Source: Ambulatory Visit | Attending: Radiation Oncology | Admitting: Radiation Oncology

## 2017-03-03 ENCOUNTER — Ambulatory Visit: Payer: 59

## 2017-03-03 DIAGNOSIS — Z510 Encounter for antineoplastic radiation therapy: Secondary | ICD-10-CM | POA: Diagnosis not present

## 2017-03-03 DIAGNOSIS — C61 Malignant neoplasm of prostate: Secondary | ICD-10-CM | POA: Diagnosis not present

## 2017-03-04 ENCOUNTER — Ambulatory Visit
Admission: RE | Admit: 2017-03-04 | Discharge: 2017-03-04 | Disposition: A | Discharge status: Home / Self Care | Payer: 59 | Source: Ambulatory Visit | Attending: Radiation Oncology | Admitting: Radiation Oncology

## 2017-03-04 DIAGNOSIS — C61 Malignant neoplasm of prostate: Secondary | ICD-10-CM | POA: Diagnosis not present

## 2017-03-04 DIAGNOSIS — Z510 Encounter for antineoplastic radiation therapy: Secondary | ICD-10-CM | POA: Diagnosis not present

## 2017-03-05 ENCOUNTER — Ambulatory Visit
Admission: RE | Admit: 2017-03-05 | Discharge: 2017-03-05 | Disposition: A | Discharge status: Home / Self Care | Payer: 59 | Source: Ambulatory Visit | Attending: Radiation Oncology | Admitting: Radiation Oncology

## 2017-03-05 DIAGNOSIS — Z510 Encounter for antineoplastic radiation therapy: Secondary | ICD-10-CM | POA: Diagnosis not present

## 2017-03-05 DIAGNOSIS — C61 Malignant neoplasm of prostate: Secondary | ICD-10-CM | POA: Diagnosis not present

## 2017-03-08 ENCOUNTER — Encounter: Payer: Self-pay | Admitting: Medical Oncology

## 2017-03-08 ENCOUNTER — Ambulatory Visit
Admission: RE | Admit: 2017-03-08 | Discharge: 2017-03-08 | Disposition: A | Discharge status: Home / Self Care | Payer: 59 | Source: Ambulatory Visit | Attending: Radiation Oncology | Admitting: Radiation Oncology

## 2017-03-08 DIAGNOSIS — Z510 Encounter for antineoplastic radiation therapy: Secondary | ICD-10-CM | POA: Diagnosis not present

## 2017-03-08 DIAGNOSIS — C61 Malignant neoplasm of prostate: Secondary | ICD-10-CM | POA: Diagnosis not present

## 2017-03-09 ENCOUNTER — Ambulatory Visit
Admission: RE | Admit: 2017-03-09 | Discharge: 2017-03-09 | Disposition: A | Discharge status: Home / Self Care | Payer: 59 | Source: Ambulatory Visit | Attending: Radiation Oncology | Admitting: Radiation Oncology

## 2017-03-09 DIAGNOSIS — Z510 Encounter for antineoplastic radiation therapy: Secondary | ICD-10-CM | POA: Diagnosis not present

## 2017-03-09 DIAGNOSIS — C61 Malignant neoplasm of prostate: Secondary | ICD-10-CM | POA: Diagnosis not present

## 2017-03-10 ENCOUNTER — Ambulatory Visit
Admission: RE | Admit: 2017-03-10 | Discharge: 2017-03-10 | Disposition: A | Discharge status: Home / Self Care | Payer: 59 | Source: Ambulatory Visit | Attending: Radiation Oncology | Admitting: Radiation Oncology

## 2017-03-10 DIAGNOSIS — C61 Malignant neoplasm of prostate: Secondary | ICD-10-CM | POA: Diagnosis not present

## 2017-03-10 DIAGNOSIS — Z510 Encounter for antineoplastic radiation therapy: Secondary | ICD-10-CM | POA: Diagnosis not present

## 2017-03-11 ENCOUNTER — Ambulatory Visit
Admission: RE | Admit: 2017-03-11 | Discharge: 2017-03-11 | Disposition: A | Discharge status: Home / Self Care | Payer: 59 | Source: Ambulatory Visit | Attending: Radiation Oncology | Admitting: Radiation Oncology

## 2017-03-11 DIAGNOSIS — C61 Malignant neoplasm of prostate: Secondary | ICD-10-CM | POA: Diagnosis not present

## 2017-03-11 DIAGNOSIS — Z510 Encounter for antineoplastic radiation therapy: Secondary | ICD-10-CM | POA: Diagnosis not present

## 2017-03-12 ENCOUNTER — Ambulatory Visit
Admission: RE | Admit: 2017-03-12 | Discharge: 2017-03-12 | Disposition: A | Discharge status: Home / Self Care | Payer: 59 | Source: Ambulatory Visit | Attending: Radiation Oncology | Admitting: Radiation Oncology

## 2017-03-12 DIAGNOSIS — C61 Malignant neoplasm of prostate: Secondary | ICD-10-CM | POA: Diagnosis not present

## 2017-03-12 DIAGNOSIS — Z510 Encounter for antineoplastic radiation therapy: Secondary | ICD-10-CM | POA: Diagnosis not present

## 2017-03-15 ENCOUNTER — Ambulatory Visit: Admission: RE | Admit: 2017-03-15 | Payer: 59 | Source: Ambulatory Visit

## 2017-03-16 ENCOUNTER — Ambulatory Visit
Admission: RE | Admit: 2017-03-16 | Discharge: 2017-03-16 | Disposition: A | Discharge status: Home / Self Care | Payer: 59 | Source: Ambulatory Visit | Attending: Radiation Oncology | Admitting: Radiation Oncology

## 2017-03-16 DIAGNOSIS — Z510 Encounter for antineoplastic radiation therapy: Secondary | ICD-10-CM | POA: Diagnosis not present

## 2017-03-16 DIAGNOSIS — C61 Malignant neoplasm of prostate: Secondary | ICD-10-CM | POA: Diagnosis not present

## 2017-03-17 ENCOUNTER — Ambulatory Visit
Admission: RE | Admit: 2017-03-17 | Discharge: 2017-03-17 | Disposition: A | Discharge status: Home / Self Care | Payer: 59 | Source: Ambulatory Visit | Attending: Radiation Oncology | Admitting: Radiation Oncology

## 2017-03-17 DIAGNOSIS — Z510 Encounter for antineoplastic radiation therapy: Secondary | ICD-10-CM | POA: Diagnosis not present

## 2017-03-17 DIAGNOSIS — C61 Malignant neoplasm of prostate: Secondary | ICD-10-CM | POA: Diagnosis not present

## 2017-03-18 ENCOUNTER — Ambulatory Visit: Payer: 59

## 2017-03-18 ENCOUNTER — Ambulatory Visit
Admission: RE | Admit: 2017-03-18 | Discharge: 2017-03-18 | Disposition: A | Discharge status: Home / Self Care | Payer: 59 | Source: Ambulatory Visit | Attending: Radiation Oncology | Admitting: Radiation Oncology

## 2017-03-18 ENCOUNTER — Encounter: Payer: Self-pay | Admitting: Medical Oncology

## 2017-03-18 DIAGNOSIS — Z510 Encounter for antineoplastic radiation therapy: Secondary | ICD-10-CM | POA: Diagnosis not present

## 2017-03-18 DIAGNOSIS — C61 Malignant neoplasm of prostate: Secondary | ICD-10-CM | POA: Diagnosis not present

## 2017-03-19 ENCOUNTER — Encounter: Payer: Self-pay | Admitting: Radiation Oncology

## 2017-03-19 ENCOUNTER — Ambulatory Visit: Payer: 59

## 2017-03-19 ENCOUNTER — Ambulatory Visit
Admission: RE | Admit: 2017-03-19 | Discharge: 2017-03-19 | Disposition: A | Discharge status: Home / Self Care | Payer: 59 | Source: Ambulatory Visit | Attending: Radiation Oncology | Admitting: Radiation Oncology

## 2017-03-19 DIAGNOSIS — Z510 Encounter for antineoplastic radiation therapy: Secondary | ICD-10-CM | POA: Diagnosis not present

## 2017-03-19 DIAGNOSIS — C61 Malignant neoplasm of prostate: Secondary | ICD-10-CM | POA: Diagnosis not present

## 2017-03-22 NOTE — Progress Notes (Signed)
  Radiation Oncology         [phone]) [phone] ________________________________  Name: Xavier Goodwin MRN: [id number]  Date: 03/19/2017  DOB: [birth year]  End of Treatment Note  Diagnosis:   56 y.o. male with a history of stage pT3a adenocarcinoma of the prostate with a Gleason's score of 4+3 and a pretreatment PSA of 13.7, status post BNS RALP on 07/02/2016 with multiple positive surgical margins and detectable post op PSA of 9.87     Indication for treatment:  Curative, Salvage Prostatic Fossa Radiotherapy       Radiation treatment dates:   01/25/2017 - 03/19/2017  Site/dose:    1. The pelvis was treated to 45 in 25 fractions of 1.8 Gy. 2. The prostatic fossa was boosted to 68.4 Gy in 13 more fractions while treating the involved node to 75 Gy in 10 more fractions.   Beams/energy:   The prostatic fossa was treated using helical intensity modulated radiotherapy delivering 6 megavolt photons for both phases. Image guidance was performed with megavoltage CT studies prior to each fraction. He was immobilized with a body fix lower extremity mold.  Narrative: The patient tolerated radiation treatment relatively well.   He experienced mild fatigue and minimal urinary symptoms, primarily nocturia x2-3. He also reported frequent, soft bowel movements but overall did excellent with his treatment.  Plan: The patient has completed radiation treatment. He will return to radiation oncology clinic for routine followup in one month. I advised him to call or return sooner if he has any questions or concerns related to his recovery or treatment. ________________________________  Sheral Apley. Tammi Klippel, M.D.  This document serves as a record of services personally performed by Tyler Pita, MD. It was created on his behalf by Rae Lips, a trained medical scribe. The creation of this record is based on the scribe's personal observations and the provider's statements to them. This document has been checked  and approved by the attending provider.

## 2017-03-23 NOTE — Progress Notes (Signed)
Mr. Pecha states he is doing well. He will complete his radiation treatments tomorrow 03/19/17. He is scheduled for follow up with [facility] 04/21/16.

## 2017-04-05 DIAGNOSIS — Z0000 Encounter for general adult medical examination without abnormal findings: Secondary | ICD-10-CM | POA: Diagnosis not present

## 2017-04-05 DIAGNOSIS — E7800 Pure hypercholesterolemia, unspecified: Secondary | ICD-10-CM | POA: Diagnosis not present

## 2017-04-05 DIAGNOSIS — R7309 Other abnormal glucose: Secondary | ICD-10-CM | POA: Diagnosis not present

## 2017-04-12 DIAGNOSIS — S43109A Unspecified dislocation of unspecified acromioclavicular joint, initial encounter: Secondary | ICD-10-CM | POA: Diagnosis not present

## 2017-04-21 ENCOUNTER — Encounter: Payer: Self-pay | Admitting: Urology

## 2017-04-21 ENCOUNTER — Ambulatory Visit
Admission: RE | Admit: 2017-04-21 | Discharge: 2017-04-21 | Disposition: A | Discharge status: Home / Self Care | Payer: 59 | Source: Ambulatory Visit | Attending: Urology | Admitting: Urology

## 2017-04-21 ENCOUNTER — Other Ambulatory Visit: Payer: Self-pay

## 2017-04-21 VITALS — BP 114/73 | HR 89 | Temp 98.4°F | Resp 20 | Ht 78.0 in | Wt 321.4 lb

## 2017-04-21 DIAGNOSIS — Z923 Personal history of irradiation: Secondary | ICD-10-CM | POA: Diagnosis not present

## 2017-04-21 DIAGNOSIS — C61 Malignant neoplasm of prostate: Secondary | ICD-10-CM | POA: Insufficient documentation

## 2017-04-21 DIAGNOSIS — Z79899 Other long term (current) drug therapy: Secondary | ICD-10-CM | POA: Diagnosis not present

## 2017-04-21 DIAGNOSIS — Z9889 Other specified postprocedural states: Secondary | ICD-10-CM | POA: Insufficient documentation

## 2017-04-21 DIAGNOSIS — Z7982 Long term (current) use of aspirin: Secondary | ICD-10-CM | POA: Diagnosis not present

## 2017-04-21 NOTE — Progress Notes (Signed)
Radiation Oncology         ([phone]) [phone] ________________________________  Name: Xavier Goodwin MRN: [id number]  Date: 04/21/2017  DOB: 10-Jan-1961  Post Treatment Note  CC: Alroy Dust, L.Marlou Sa, MD  Raynelle Bring, MD  Diagnosis:   57 y.o. male with a history of stage pT3a adenocarcinoma of the prostate with a Gleason's score of 4+3 and a pretreatment PSA of 13.7, status post BNS RALP on 07/02/2016 with multiple positive surgical margins and detectable post op PSA of 9.87     Interval Since Last Radiation:  4.5 weeks, Curative, Salvage Prostatic Fossa   01/25/2017 - 03/19/2017: 1. The pelvis was treated to 45 in 25 fractions of 1.8 Gy. 2. The prostatic fossa was boosted to 68.4 Gy in 13 more fractions while treating the involved node to 75 Gy in 10 more fractions.   Narrative:  The patient returns today for routine follow-up.  He tolerated radiation treatment relatively well.   He experienced mild fatigue and minimal urinary symptoms, primarily nocturia x2-3. He also reported frequent, soft bowel movements but overall did excellent with his treatment.                           On review of systems, the patient states that he is doing very well overall.  He continues to tolerate his ADT well despite hot flashes and "crazy dreams".  He reports that his LUTS are gradually improving and he specifically denies gross hematuria, dysuria, urgency, weak stream, incomplete emptying or incontinence.  He continues with nocturia x3/night.  Current IPSS is 8, indicating moderate urinary symptoms.  Pretreatment IPSS was 16.  He continues with intermittent bouts of increased gas and mild nausea after early morning meals which has been present since the time of his surgery and not progressively worsening or changing.  His bowel habits have returned to normal and he currently denies abdominal pain, N/V or diarrhea.  He reports a healthy appetite and is maintaining his weight.  ALLERGIES:  has No Known  Allergies.  Meds: Current Outpatient Medications  Medication Sig Dispense Refill  . aspirin EC 81 MG tablet Take 81 mg by mouth daily.    . rosuvastatin (CRESTOR) 20 MG tablet Take 20 mg by mouth daily.     No current facility-administered medications for this encounter.     Physical Findings:  height is 6\' 6"  (1.981 m) and weight is 321 lb 6.4 oz (145.8 kg) (abnormal). His oral temperature is 98.4 F (36.9 C). His blood pressure is 114/73 and his pulse is 89. His respiration is 20 and oxygen saturation is 100%.  Pain Assessment Pain Score: 0-No pain/10 In general this is a well appearing african [place] male in no acute distress. He's alert and oriented x4 and appropriate throughout the examination. Cardiopulmonary assessment is negative for acute distress and he exhibits normal effort.   Lab Findings: Lab Results  Component Value Date   WBC 13.6 (H) 08/12/2016   HGB 9.6 (L) 08/12/2016   HCT 26.3 (L) 08/12/2016   MCV 90.1 08/12/2016   PLT 112 (L) 08/12/2016     Radiographic Findings: No results found.  Impression/Plan: 11. 57 y.o. male with a history of stage pT3a adenocarcinoma of the prostate with a Gleason's score of 4+3 and a pretreatment PSA of 13.7, status post BNS RALP on 07/02/2016 with multiple positive surgical margins and detectable post op PSA of 9.87.    He will continue to follow up with  urology for ongoing PSA determinations and has an appointment scheduled with Dr. Alinda Money in May 2019 for repeat Lupron injection and PSA.  He anticipates completing a total of 18 months of ADT under the care and guidance of Dr. Alinda Money.  He understands what to expect with regards to PSA monitoring going forward. I will look forward to following his response to treatment via correspondence with urology, and would be happy to continue to participate in his care if clinically indicated. I talked to the patient about what to expect in the future, including his risk for erectile dysfunction  and rectal bleeding. I encouraged him to call or return to the office if he has any questions regarding his previous radiation or possible radiation side effects. He was comfortable with this plan and will follow up as needed.    Nicholos Johns, PA-C

## 2017-06-11 DIAGNOSIS — C61 Malignant neoplasm of prostate: Secondary | ICD-10-CM | POA: Diagnosis not present

## 2017-06-18 DIAGNOSIS — C61 Malignant neoplasm of prostate: Secondary | ICD-10-CM | POA: Diagnosis not present

## 2017-06-18 DIAGNOSIS — Z5111 Encounter for antineoplastic chemotherapy: Secondary | ICD-10-CM | POA: Diagnosis not present

## 2017-11-01 NOTE — Progress Notes (Signed)
Patient ID: Xavier Goodwin, male   DOB: 1961/01/17, 57 y.o.   MRN: [id number]   57 y.o.  Initially  referred by [name] for chest pain in 2015  Myovue at that time normal EF 51% Had another episode of pain 2017 with EF estimated 48% and ? Lateral wall ischemia. 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  All other systems reviewed and negative   General: Affect appropriate Healthy:  appears stated age 57: normal Neck supple with no adenopathy JVP normal no bruits no thyromegaly Lungs clear with no wheezing and good diaphragmatic motion Heart:  S1/S2 no murmur, no rub, gallop or click PMI normal Abdomen: benighn, BS positve, no tenderness, no AAA no bruit.  No HSM or HJR Distal pulses intact with no bruits No edema Neuro non-focal Skin warm and dry No muscular weakness   Medications Current Outpatient Medications  Medication Sig Dispense Refill  . aspirin EC 81 MG tablet Take 81 mg by mouth daily.    . rosuvastatin (CRESTOR) 20 MG tablet Take 20 mg by mouth daily.     No current facility-administered medications for this visit.     Allergies Patient has no known allergies.  Family History: Family History  Problem Relation Age of Onset  . Hypertension Father   . Heart attack Father   . Diabetes Mother   . Cancer Mother   . Lupus Sister     Social History: Social History   Socioeconomic History  . Marital status:  Married    Spouse name: Not on file  . Number of children: Not on file  . Years of education: Not on file  . Highest education level: Not on file  Occupational History  . Not on file  Social Needs  . Financial resource strain: Not on file  . Food insecurity:    Worry: Not on file    Inability: Not on file  . Transportation needs:    Medical: Not on file    Non-medical: Not on file  Tobacco Use  . Smoking status: Former Smoker    Packs/day: 1.00    Years: 30.00    Pack years: 30.00    Types: Cigarettes    Last attempt to quit: 08/04/2016    Years since quitting: 1.2  . Smokeless tobacco: Never Used  . Tobacco comment: greater than 20 years   Substance and Sexual Activity  . Alcohol use: No  . Drug use: No  . Sexual activity: Never  Lifestyle  . Physical activity:    Days per week: Not on file    Minutes per session: Not on file  . Stress: Not on file  Relationships  . Social connections:    Talks on phone: Not on file    Gets together: Not on file    Attends religious service: Not on file    Active member of club or organization: Not on file    Attends meetings of clubs or organizations: Not on file  Relationship status: Not on file  . Intimate partner violence:    Fear of current or ex partner: Not on file    Emotionally abused: Not on file    Physically abused: Not on file    Forced sexual activity: Not on file  Other Topics Concern  . Not on file  Social History Narrative  . Not on file    Electrocardiogram:   11/04/17 SR rate 67 PVC   Assessment and Plan Chol:  On statin labs with Dr Alroy Dust Chest Pain: Atypical cath 11/25/15 no obstructive disease High calcium score continue ASA and statin repeat calcium score next year  Smoking:  counseled on cessation and risk of cancer and vascular disease Needs CXR Has not had one in over a year  DM:  Has too much candy will have A1c with Dr Bebe Shaggy Prostate:  F/u Dr Alinda Money with PSA    Jenkins Rouge

## 2017-11-04 ENCOUNTER — Ambulatory Visit
Admission: RE | Admit: 2017-11-04 | Discharge: 2017-11-04 | Disposition: A | Discharge status: Home / Self Care | Payer: 59 | Source: Ambulatory Visit | Attending: Cardiovascular Disease | Admitting: Cardiovascular Disease

## 2017-11-04 ENCOUNTER — Ambulatory Visit: Payer: 59 | Admitting: Cardiovascular Disease

## 2017-11-04 ENCOUNTER — Encounter: Payer: Self-pay | Admitting: Cardiovascular Disease

## 2017-11-04 VITALS — BP 100/70 | HR 71 | Ht 78.0 in | Wt 229.5 lb

## 2017-11-04 DIAGNOSIS — F17200 Nicotine dependence, unspecified, uncomplicated: Secondary | ICD-10-CM

## 2017-11-04 DIAGNOSIS — R05 Cough: Secondary | ICD-10-CM | POA: Diagnosis not present

## 2017-11-04 DIAGNOSIS — E7800 Pure hypercholesterolemia, unspecified: Secondary | ICD-10-CM

## 2017-11-04 DIAGNOSIS — R079 Chest pain, unspecified: Secondary | ICD-10-CM

## 2017-11-04 NOTE — Patient Instructions (Addendum)
Medication Instructions:  Your physician recommends that you continue on your current medications as directed. Please refer to the Current Medication list given to you today.  Labwork: NONE  Testing/Procedures: A chest x-ray takes a picture of the organs and structures inside the chest, including the heart, lungs, and blood vessels. This test can show several things, including, whether the heart is enlarges; whether fluid is building up in the lungs; and whether pacemaker / defibrillator leads are still in place. Please go to [place] To [facility] for chest x-ray.   Follow-Up: Your physician wants you to follow-up in: 12 months with Dr. Johnsie Cancel. You will receive a reminder letter in the mail two months in advance. If you don't receive a letter, please call our office to schedule the follow-up appointment.   If you need a refill on your cardiac medications before your next appointment, please call your pharmacy.

## 2017-12-12 DIAGNOSIS — K1120 Sialoadenitis, unspecified: Secondary | ICD-10-CM | POA: Diagnosis not present

## 2017-12-12 DIAGNOSIS — H6123 Impacted cerumen, bilateral: Secondary | ICD-10-CM | POA: Diagnosis not present

## 2017-12-13 DIAGNOSIS — Z23 Encounter for immunization: Secondary | ICD-10-CM | POA: Diagnosis not present

## 2017-12-22 DIAGNOSIS — C61 Malignant neoplasm of prostate: Secondary | ICD-10-CM | POA: Diagnosis not present

## 2017-12-31 DIAGNOSIS — C61 Malignant neoplasm of prostate: Secondary | ICD-10-CM | POA: Diagnosis not present

## 2018-04-26 DIAGNOSIS — E7800 Pure hypercholesterolemia, unspecified: Secondary | ICD-10-CM | POA: Diagnosis not present

## 2018-04-26 DIAGNOSIS — Z0000 Encounter for general adult medical examination without abnormal findings: Secondary | ICD-10-CM | POA: Diagnosis not present

## 2018-05-22 IMAGING — CT CT HEART SCORING
2 series · 16 of 20 positions shown, 18 images · non-contrast
Comparison: No priors.

CLINICAL DATA: Risk stratification

EXAM:
Coronary Calcium Score
TECHNIQUE: The patient was scanned on a Siemens Sensation 16 slice scanner.
Axial non-contrast 3mm slices were carried out through the heart.
The data set was analyzed on a dedicated work station and scored
using the Agatson method.

[Series 2: casc 3.0 i36f 2 bestdiast 71 % · axial · 0.39mm/px · z∈[+1187,+1292]mm · 8 of 46 slices shown, 10 images]
[im 6/46  vessel]
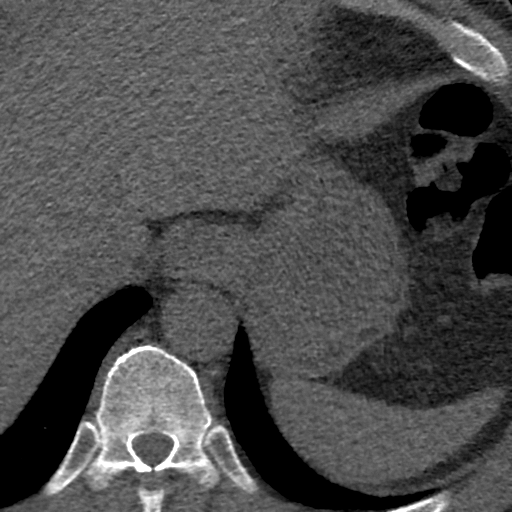
[im 6/46  lung]
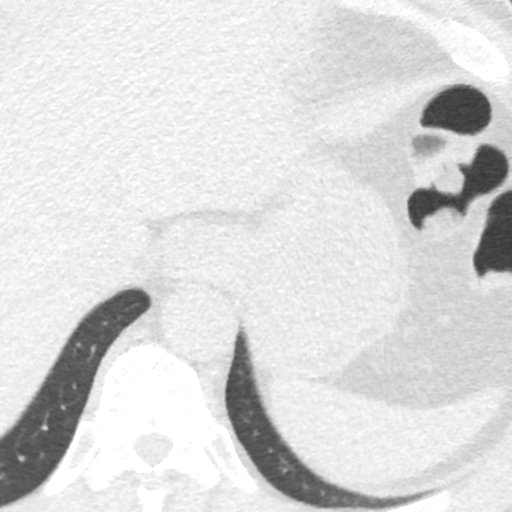
[im 11/46  vessel]
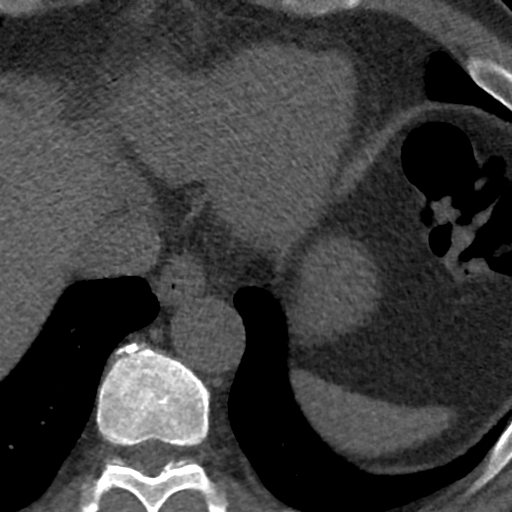
[im 16/46  vessel]
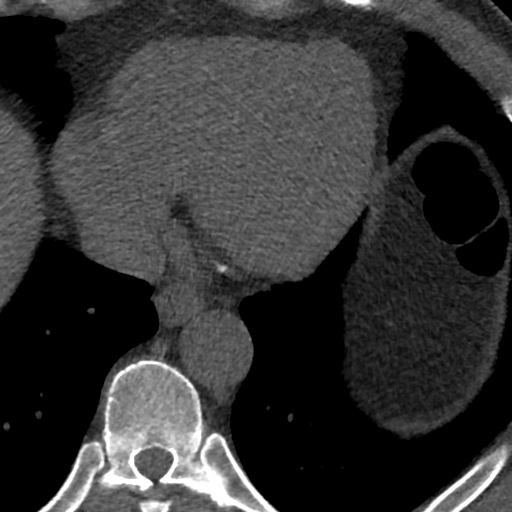
[im 21/46  vessel]
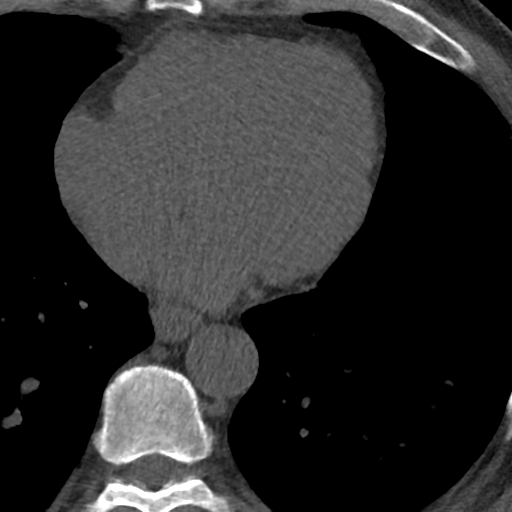
[im 26/46  vessel]
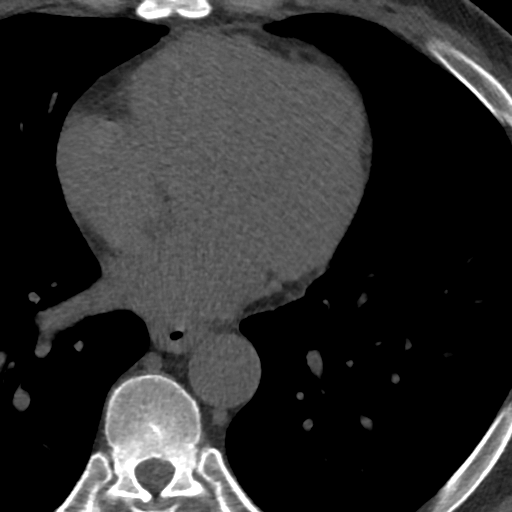
[im 26/46  lung]
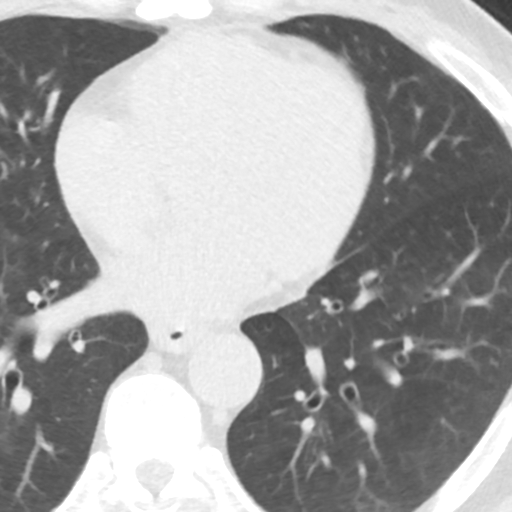
[im 31/46  vessel]
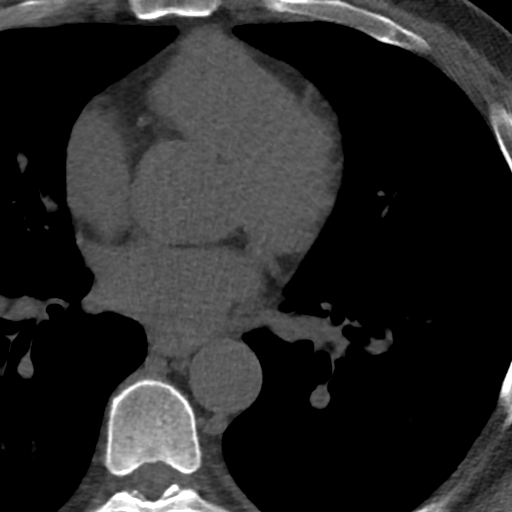
[im 36/46  vessel]
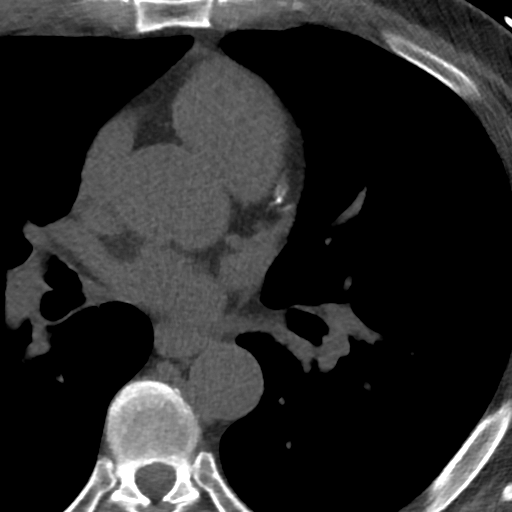
[im 41/46  vessel]
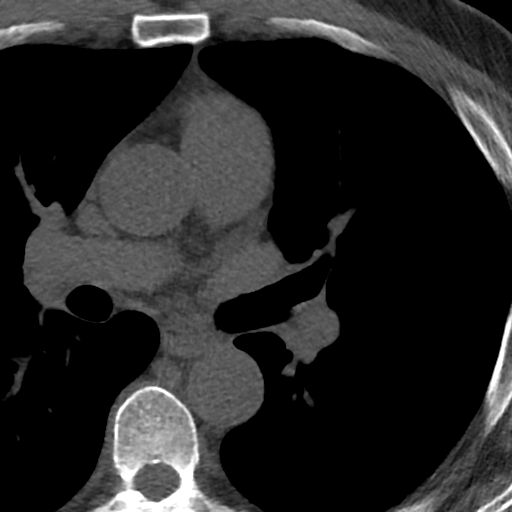

[Series 4: lung st 71 % · axial · 0.72mm/px · z∈[+1187,+1292]mm · 8 of 46 slices shown]
[im 6/46  lung]
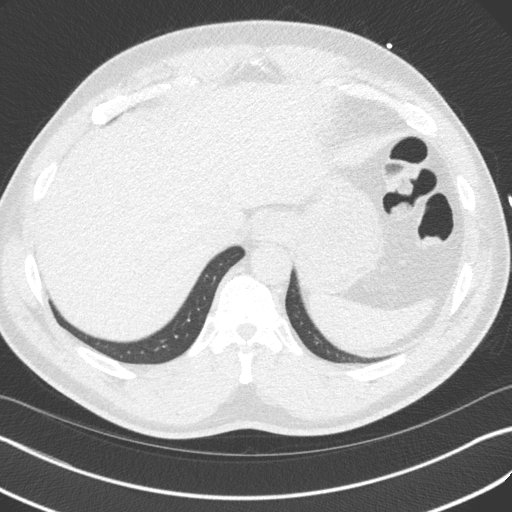
[im 11/46  lung]
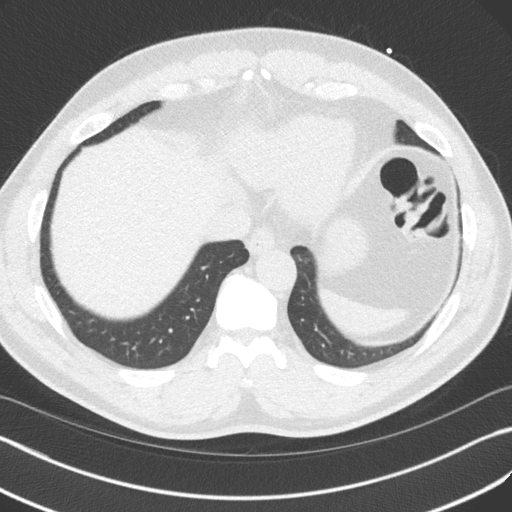
[im 16/46  lung]
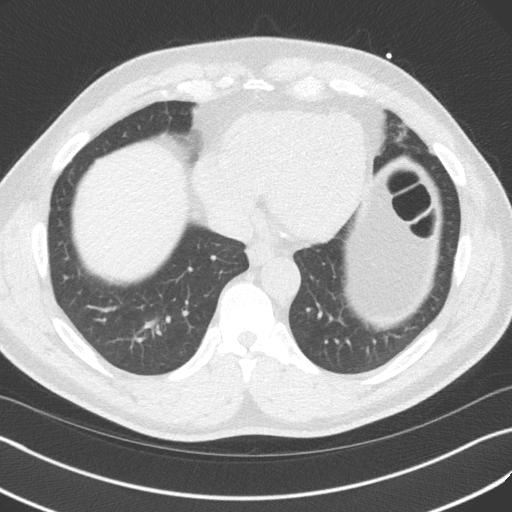
[im 21/46  lung]
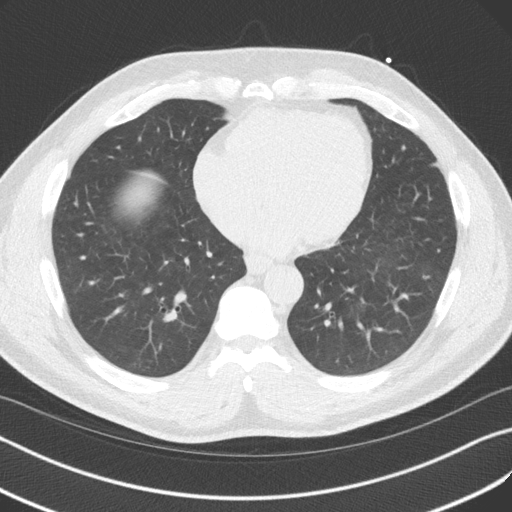
[im 26/46  lung]
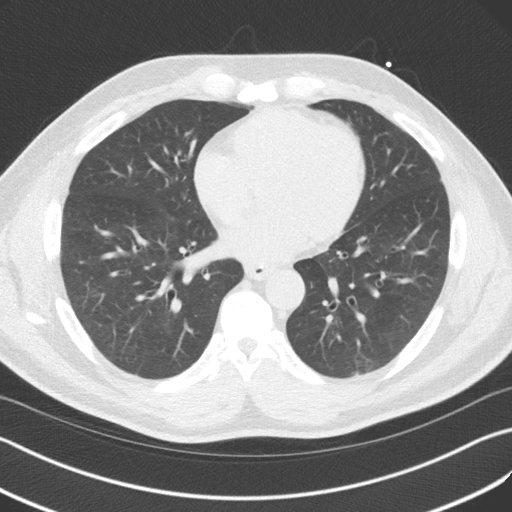
[im 31/46  lung]
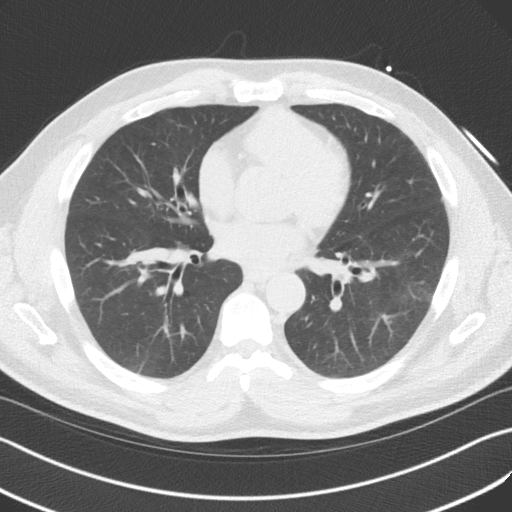
[im 36/46  lung]
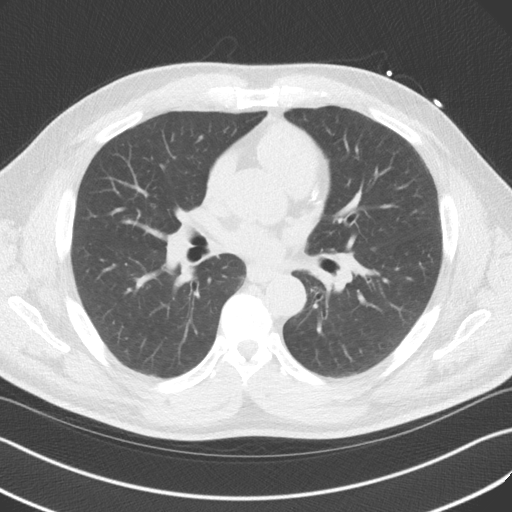
[im 41/46  lung]
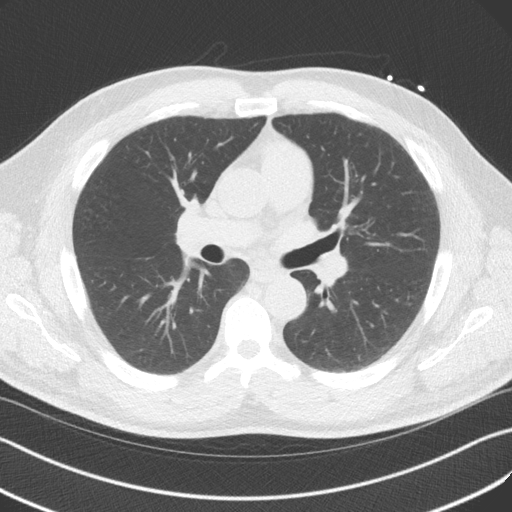

[16 of 20 positions shown; findings below may reference images not displayed]

FINDINGS: Non-cardiac: No significant non cardiac findings on limited lung and
soft tissue windows. See separate report from [REDACTED].

Ascending Aorta:  3.6 cm

Pericardium: Normal

Coronary arteries: Calcium noted in distal LM, LAD RCA and distal
circumflex
IMPRESSION: Coronary calcium score of 266. This was 81st percentile for age and
sex matched control.

Rochelle Alfa

EXAM:
OVER-READ INTERPRETATION  CT CHEST

The following report is an over-read performed by radiologist Dr.
over-read does not include interpretation of cardiac or coronary
anatomy or pathology. The coronary calcium score interpretation by
the cardiologist is attached.
FINDINGS: Within the visualized portions of the thorax there it are no
suspicious appearing pulmonary nodules or masses, there is no acute
consolidative airspace disease, no pleural effusions, no
pneumothorax and no lymphadenopathy. Visualized portions of the
upper abdomen are unremarkable. There are no aggressive appearing
lytic or blastic lesions noted in the visualized portions of the
skeleton.
IMPRESSION: 1. No significant incidental noncardiac findings are noted.

## 2018-06-29 DIAGNOSIS — C61 Malignant neoplasm of prostate: Secondary | ICD-10-CM | POA: Diagnosis not present

## 2018-07-11 DIAGNOSIS — C61 Malignant neoplasm of prostate: Secondary | ICD-10-CM | POA: Diagnosis not present

## 2019-02-03 IMAGING — DX DG CHEST 2V
2 series · 2 of 2 positions shown · non-contrast
Comparison: 07/03/2013.

CLINICAL DATA: Preoperative respiratory evaluation for robot
assisted laparoscopic prostatectomy.

EXAM:
CHEST  2 VIEW

[chest pa]
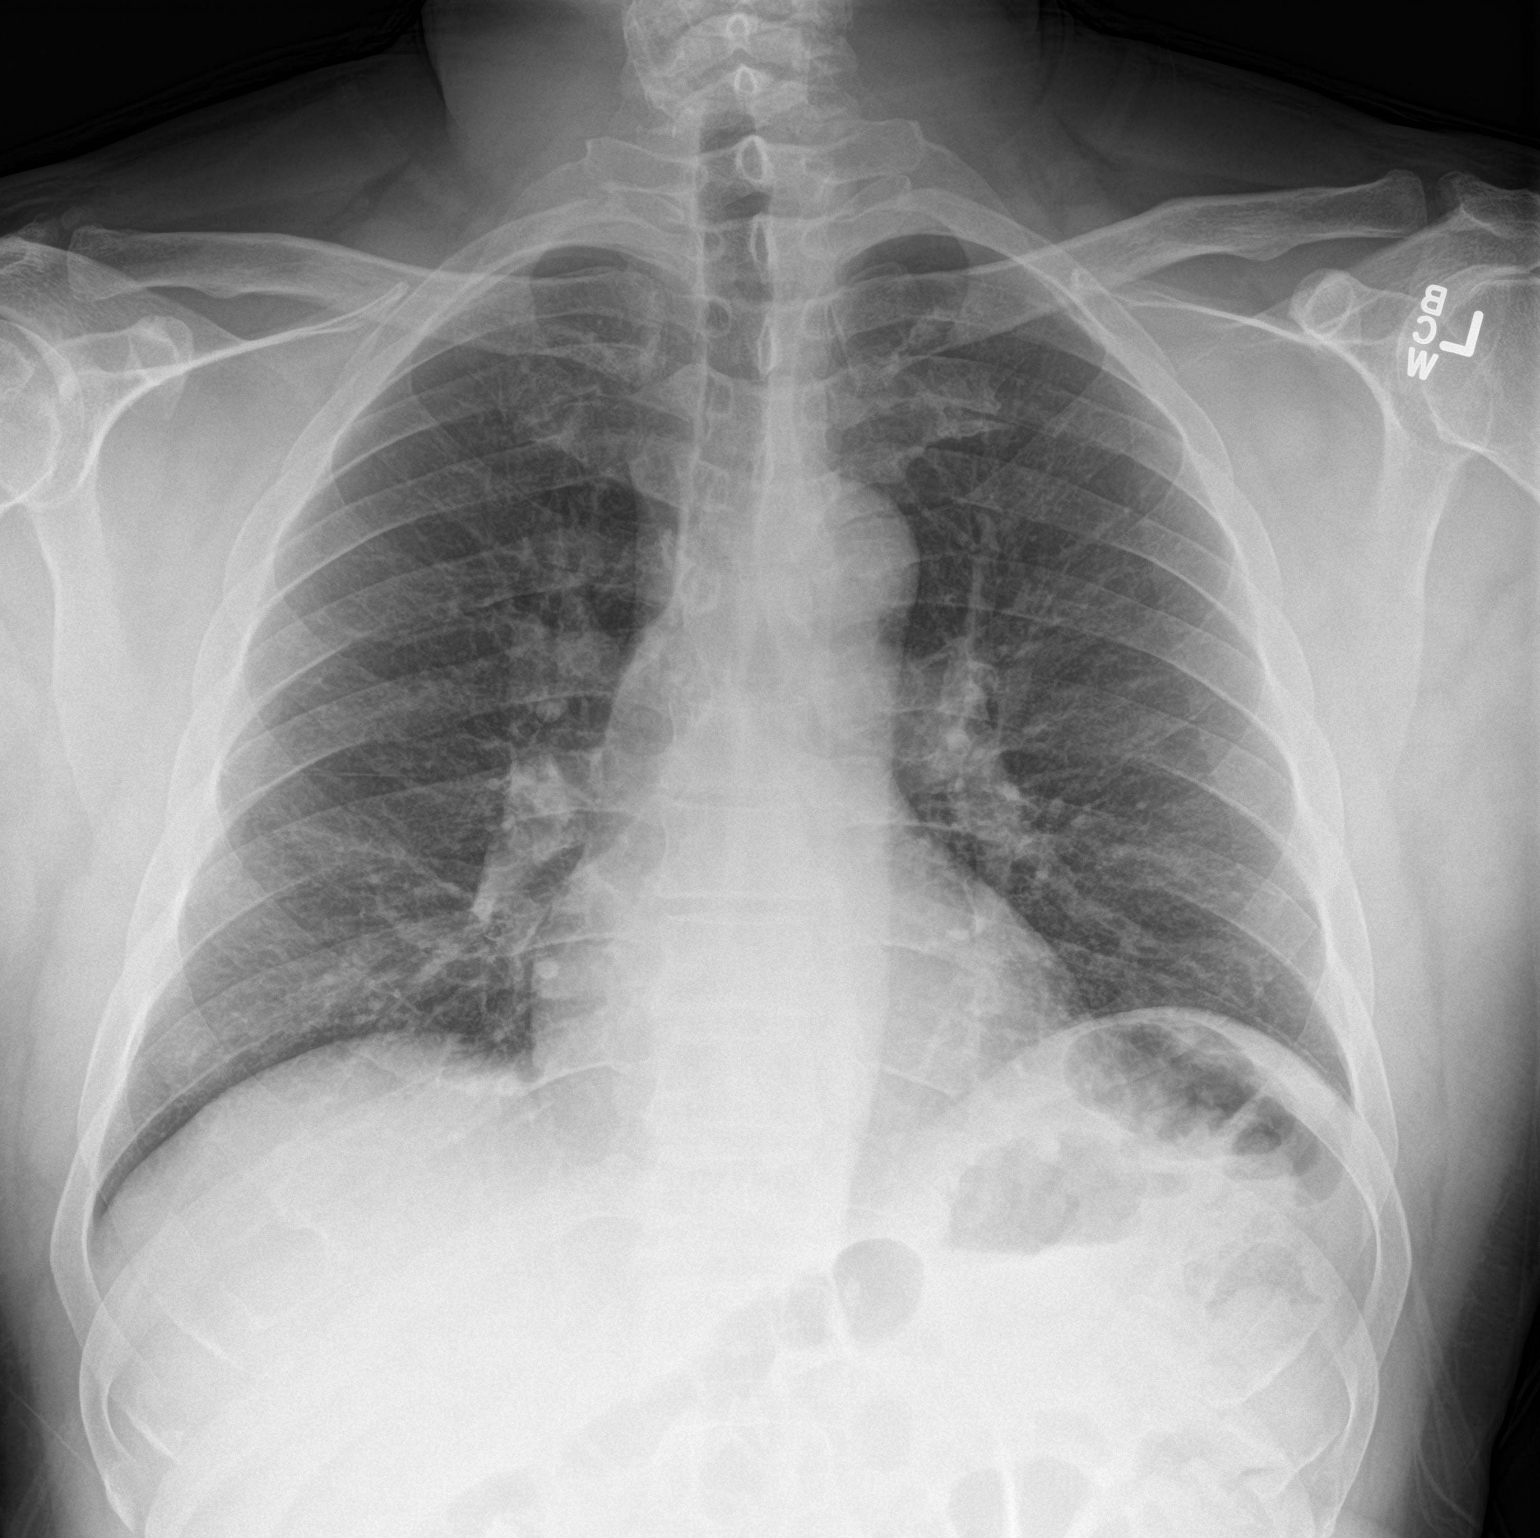

[chest lat]
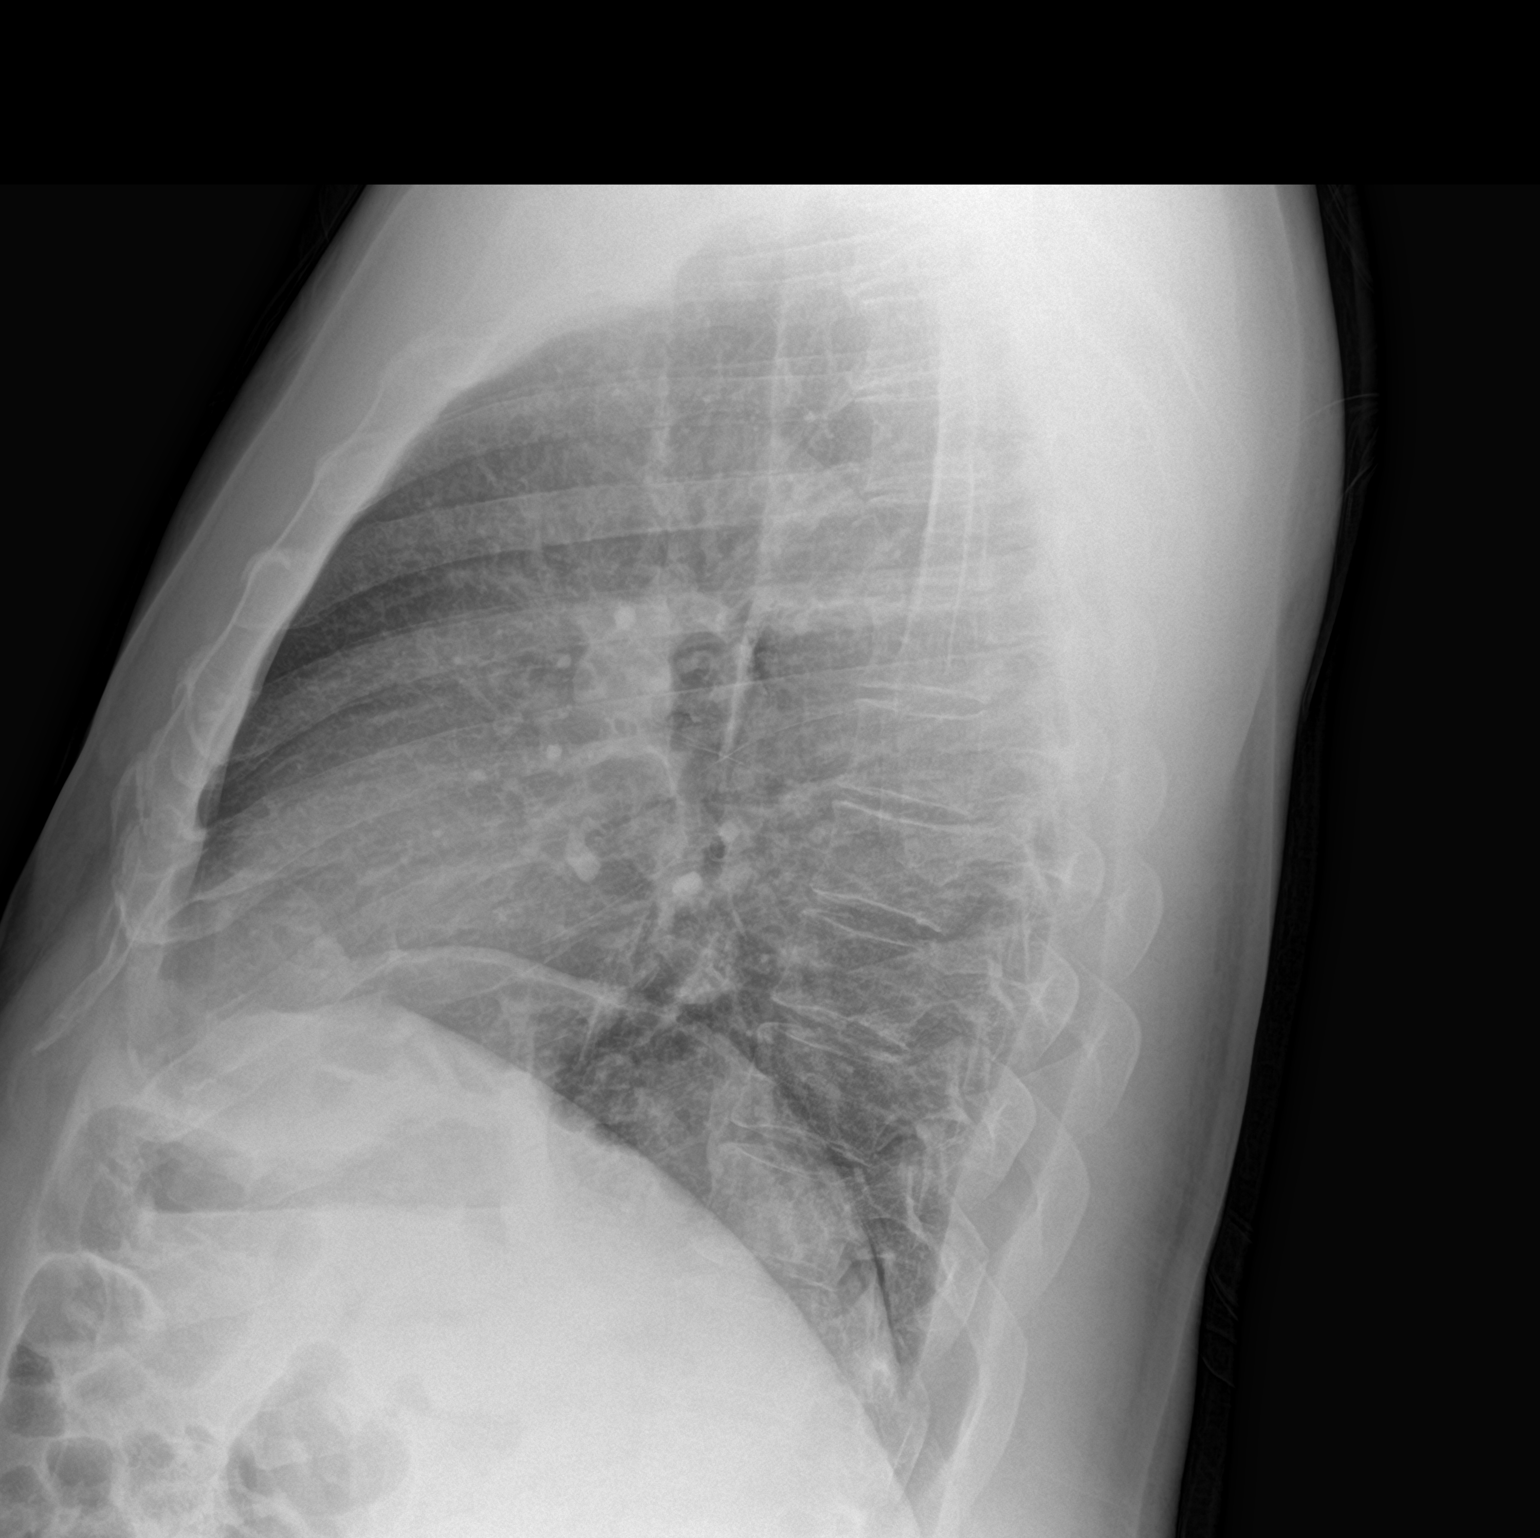

[2 of 2 positions shown; findings below may reference images not displayed]

FINDINGS: The lungs are clear wiithout focal pneumonia, edema, pneumothorax or
pleural effusion. Interstitial markings are diffusely coarsened with
chronic features. The cardiopericardial silhouette is within normal
limits for size. The visualized bony structures of the thorax are
intact.
IMPRESSION: No active cardiopulmonary disease.

## 2020-10-21 NOTE — Progress Notes (Signed)
Cardiology Office Note    Date:  10/23/2020   ID:  JEN EPPINGER, DOB 25-Jan-1961, MRN [id number]   PCP:  Aurea Graff.Marlou Sa, [facility]  Cardiologist:  Jenkins Rouge, MD   Advanced Practice Provider:  No care team member to display Electrophysiologist:  None   [id number]   Chief Complaint  Patient presents with   Follow-up   Chest Pain     History of Present Illness:  Xavier Goodwin is a 60 y.o. male with history of chest pain, normal NST 2015, recurrent 2017 EF 48% ? Lat wall ischemia, cath 11/2015 50% ramus, 30% Cfx, calcium score 266 81st percentile age and sex 10/2015  Last saw Dr. Johnsie Cancel 11/2017 and was still smoking. Plan to repeat calcium score 2020 not done.  Patient comes in for f/u. Quit drinking caffeine or soda. The past 2 weeks he has a dull ache in upper right chest that lasts a few seconds and notices when he lays on his stomach. Reproducible to touch. 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 No tightness, pressure, dyspnea. Still smoking 1 ppd, trying to work on quitting.    Past Medical History:  Diagnosis Date   Chest pain    none recently since "i stopped drinking the "big gulp" of caffeine    Cholelithiasis 08/2016   History of bronchitis    History of kidney stones    History of urinary tract infection    Hypercholesteremia    Pneumonia    Prostate cancer ([place])    Sepsis ([place]) 08/2016    Past Surgical History:  Procedure Laterality Date   CARDIAC CATHETERIZATION N/A 11/25/2015   Procedure: Left Heart Cath and Coronary Angiography;  Surgeon: Josue Hector, MD;  Location: [facility];  Service: Cardiovascular;  Laterality: N/A;   CYSTOSCOPY W/ URETERAL STENT PLACEMENT Right 08/10/2016   Procedure: CYSTOSCOPY WITH RETROGRADE PYELOGRAM RIGHT URETERAL STENT PLACEMENT;  Surgeon: Irine Seal, MD;  Location: [facility];  Service: Urology;  Laterality: Right;   EXTRACORPOREAL SHOCK  WAVE LITHOTRIPSY Right 08/24/2016   Procedure: RIGHT EXTRACORPOREAL SHOCK WAVE LITHOTRIPSY (ESWL);  Surgeon: Kathie Rhodes, MD;  Location: [facility];  Service: Urology;  Laterality: Right;   PELVIC LYMPH NODE DISSECTION Bilateral 07/02/2016   Procedure: BILATERAL PELVIC LYMPH NODE DISSECTION;  Surgeon: Raynelle Bring, MD;  Location: [facility];  Service: Urology;  Laterality: Bilateral;   ROBOT ASSISTED LAPAROSCOPIC RADICAL PROSTATECTOMY N/A 07/02/2016   Procedure: XI ROBOTIC ASSISTED LAPAROSCOPIC RADICAL PROSTATECTOMY LEVEL 2;  Surgeon: Raynelle Bring, MD;  Location: [facility];  Service: Urology;  Laterality: N/A;    Current Medications: Current Meds  Medication Sig   [DISCONTINUED] rosuvastatin (CRESTOR) 20 MG tablet Take 20 mg by mouth daily.     Allergies:   Patient has no known allergies.   Social History   Socioeconomic History   Marital status: Married    Spouse name: Not on file   Number of children: Not on file   Years of education: Not on file   Highest education level: Not on file  Occupational History   Not on file  Tobacco Use   Smoking status: Former    Packs/day: 1.00    Years: 30.00    Pack years: 30.00    Types: Cigarettes    Quit date: 08/04/2016    Years since quitting: 4.2   Smokeless tobacco: Never   Tobacco comments:    greater  than 20 years   Vaping Use   Vaping Use: Never used  Substance and Sexual Activity   Alcohol use: No   Drug use: No   Sexual activity: Never  Other Topics Concern   Not on file  Social History Narrative   Not on file   Social Determinants of Health   Financial Resource Strain: Not on file  Food Insecurity: Not on file  Transportation Needs: Not on file  Physical Activity: Not on file  Stress: Not on file  Social Connections: Not on file     Family History:  The patient's  family history includes Cancer in his mother; Diabetes in his mother; Heart attack in his father; Hypertension in his father; Lupus in his sister.   ROS:    Please see the history of present illness.    ROS All other systems reviewed and are negative.   PHYSICAL EXAM:   VS:  BP 110/70   Pulse 61   Ht 6\' 1"  (1.854 m)   Wt 212 lb (96.2 kg)   SpO2 96%   BMI 27.97 kg/m   Physical Exam  GEN: Well nourished, well developed, in no acute distress  Neck: no JVD, carotid bruits, or masses Cardiac:RRR; no murmurs, rubs, or gallops, chest tender touch right chest. Respiratory:  clear to auscultation bilaterally, normal work of breathing GI: soft, nontender, nondistended, + BS Ext: without cyanosis, clubbing, or edema, Good distal pulses bilaterally Neuro:  Alert and Oriented x 3 Psych: euthymic mood, full affect  Wt Readings from Last 3 Encounters:  10/23/20 212 lb (96.2 kg)  11/04/17 229 lb 8 oz (104.1 kg)  04/21/17 (!) 321 lb 6.4 oz (145.8 kg)      Studies/Labs Reviewed:   EKG:  EKG is  ordered today.  The ekg ordered today demonstrates NSR normal EKG no change.  Recent Labs: No results found for requested labs within last 8760 hours.   Lipid Panel No results found for: CHOL, TRIG, HDL, CHOLHDL, VLDL, LDLCALC, LDLDIRECT  Additional studies/ records that were reviewed today include:  Coronary calcium score 10/16/15 FINDINGS: Non-cardiac: No significant non cardiac findings on limited lung and soft tissue windows. See separate report from [facility]. Ascending Aorta:  3.6 cm Pericardium: Normal Coronary arteries: Calcium noted in distal LM, LAD RCA and distal circumflex IMPRESSION: Coronary calcium score of 266. This was 81st percentile for age and sex matched control. Jenkins Rouge Electronically Signed   By: Jenkins Rouge M.D.   On: 10/21/2015 10:32  Cardiac cath 11/25/15 Ramus lesion, 50 %stenosed. Prox Cx lesion, 30 %stenosed.   Myovue abnormal in basal lateral wall no lesion to correspond 50% ostial small IM and 30% calcified proximal LAD Medical Rx. Should not have any flow limiting lesions to cause  angina   Risk Assessment/Calculations:         ASSESSMENT:    1. Coronary artery disease involving native coronary artery of native heart without angina pectoris   2. Hypercholesteremia   3. Tobacco abuse      PLAN:  In order of problems listed above:  CAD with 50% ramus, 30% prox Cfx cath 2018-no angina, chest pain musculoskeletal. Restart ASA 81 mg daily.  HLD LDL 90 in 05/2020 increase Crestor 40 mg once daily, repeat FLP in 3 months  Tobacco abuse 1 ppd trying to quit. Smoking cessation discussed.    Shared Decision Making/Informed Consent        Medication Adjustments/Labs and Tests Ordered: Current medicines are reviewed at  length with the patient today.  Concerns regarding medicines are outlined above.  Medication changes, Labs and Tests ordered today are listed in the Patient Instructions below. Patient Instructions  Medication Instructions:  Your physician has recommended you make the following change in your medication:   INCREASE: Rosuvastatin (Crestor) to 40mg  daily  *If you need a refill on your cardiac medications before your next appointment, please call your pharmacy*   Lab Work: Your physician recommends that you return for a FASTING lipid profile:   If you have labs (blood work) drawn today and your tests are completely normal, you will receive your results only by: [place] (if you have MyChart) OR A paper copy in the mail If you have any lab test that is abnormal or we need to change your treatment, we will call you to review the results.   Follow-Up: At [facility], you and your health needs are our priority.  As part of our continuing mission to provide you with exceptional heart care, we have created designated Provider Care Teams.  These Care Teams include your primary Cardiologist (physician) and Advanced Practice Providers (APPs -  Physician Assistants and Nurse Practitioners) who all work together to provide you with the care you  need, when you need it.  We recommend signing up for the patient portal called "MyChart".  Sign up information is provided on this After Visit Summary.  MyChart is used to connect with patients for Virtual Visits (Telemedicine).  Patients are able to view lab/test results, encounter notes, upcoming appointments, etc.  Non-urgent messages can be sent to your provider as well.   To learn more about what you can do with MyChart, go to [email or web address].    Your next appointment:   1 year(s)  The format for your next appointment:   In Person  Provider:   You may see Jenkins Rouge, MD or one of the following Advanced Practice Providers on your designated Care Team:   Cecilie Kicks, NP   Other Instructions Managing the Challenge of Quitting Smoking Quitting smoking is a physical and mental challenge. You will face cravings, withdrawal symptoms, and temptation. Before quitting, work with your health care provider to make a plan that can help you manage quitting. Preparation canhelp you quit and keep you from giving in. How to manage lifestyle changes Managing stress Stress can make you want to smoke, and wanting to smoke may cause stress. It is important to find ways to manage your stress. You might try some of the following: Practice relaxation techniques. Breathe slowly and deeply, in through your nose and out through your mouth. Listen to music. Soak in a bath or take a shower. Imagine a peaceful place or vacation. Get some support. Talk with family or friends about your stress. Join a support group. Talk with a counselor or therapist. Get some physical activity. Go for a walk, run, or bike ride. Play a favorite sport. Practice yoga.  Medicines Talk with your health care provider about medicines that might help you dealwith cravings and make quitting easier for you. Relationships Social situations can be difficult when you are quitting smoking. To manage this, you can: Avoid  parties and other social situations where people might be smoking. Avoid alcohol. Leave right away if you have the urge to smoke. Explain to your family and friends that you are quitting smoking. Ask for support and let them know you might be a bit grumpy. Plan activities where smoking is not an option. General  instructions Be aware that many people gain weight after they quit smoking. However, not everyone does. To keep from gaining weight, have a plan in place before you quit and stick to the plan after you quit. Your plan should include: Having healthy snacks. When you have a craving, it may help to: Eat popcorn, carrots, celery, or other cut vegetables. Chew sugar-free gum. Changing how you eat. Eat small portion sizes at meals. Eat 4-6 small meals throughout the day instead of 1-2 large meals a day. Be mindful when you eat. Do not watch television or do other things that might distract you as you eat. Exercising regularly. Make time to exercise each day. If you do not have time for a long workout, do short bouts of exercise for 5-10 minutes several times a day. Do some form of strengthening exercise, such as weight lifting. Do some exercise that gets your heart beating and causes you to breathe deeply, such as walking fast, running, swimming, or biking. This is very important. Drinking plenty of water or other low-calorie or no-calorie drinks. Drink 6-8 glasses of water daily.  How to recognize withdrawal symptoms Your body and mind may experience discomfort as you try to get used to not having nicotine in your system. These effects are called withdrawal symptoms. They may include: Feeling hungrier than normal. Having trouble concentrating. Feeling irritable or restless. Having trouble sleeping. Feeling depressed. Craving a cigarette. To manage withdrawal symptoms: Avoid places, people, and activities that trigger your cravings. Remember why you want to quit. Get plenty of  sleep. Avoid coffee and other caffeinated drinks. These may worsen some of your symptoms. These symptoms may surprise you. But be assured that they are normal to havewhen quitting smoking. How to manage cravings Come up with a plan for how to deal with your cravings. The plan should include the following: A definition of the specific situation you want to deal with. An alternative action you will take. A clear idea for how this action will help. The name of someone who might help you with this. Cravings usually last for 5-10 minutes. Consider taking the following actions to help you with your plan to deal with cravings: Keep your mouth busy. Chew sugar-free gum. Suck on hard candies or a straw. Brush your teeth. Keep your hands and body busy. Change to a different activity right away. Squeeze or play with a ball. Do an activity or a hobby, such as making bead jewelry, practicing needlepoint, or working with wood. Mix up your normal routine. Take a short exercise break. Go for a quick walk or run up and down stairs. Focus on doing something kind or helpful for someone else. Call a friend or family member to talk during a craving. Join a support group. Contact a quitline. Where to find support To get help or find a support group: Call the [facility]'s Smoking Quitline: 1-800-QUIT NOW 307-495-6349) Visit the website of the Substance Abuse and [place]: ktimeonline.com Text QUIT to SmokefreeTXT: 751025 Where to find more information Visit these websites to find more information on quitting smoking: [place]: www.smokefree.gov American Lung Association: www.lung.org American Cancer Society: www.cancer.org Centers for Disease Control and Prevention: http://www.wolf.info/ American Heart Association: www.heart.org Contact a health care provider if: You want to change your plan for quitting. The medicines you are taking are not  helping. Your eating feels out of control or you cannot sleep. Get help right away if: You feel depressed or become very  anxious. Summary Quitting smoking is a physical and mental challenge. You will face cravings, withdrawal symptoms, and temptation to smoke again. Preparation can help you as you go through these challenges. Try different techniques to manage stress, handle social situations, and prevent weight gain. You can deal with cravings by keeping your mouth busy (such as by chewing gum), keeping your hands and body busy, calling family or friends, or contacting a quitline for people who want to quit smoking. You can deal with withdrawal symptoms by avoiding places where people smoke, getting plenty of rest, and avoiding drinks with caffeine. This information is not intended to replace advice given to you by your health care provider. Make sure you discuss any questions you have with your healthcare provider. Document Revised: 01/10/2019 Document Reviewed: 01/10/2019 Elsevier Patient Education  2022 [place], Ermalinda Barrios, [place]  10/23/2020 1:13 PM    [facility] [place], [place], [place]  [address] Phone: [phone]; Fax: [phone]

## 2020-10-23 ENCOUNTER — Ambulatory Visit: Payer: 59 | Admitting: Physician Assistant

## 2020-10-23 ENCOUNTER — Encounter: Payer: Self-pay | Admitting: Physician Assistant

## 2020-10-23 ENCOUNTER — Other Ambulatory Visit: Payer: Self-pay

## 2020-10-23 VITALS — BP 110/70 | HR 61 | Ht 73.0 in | Wt 212.0 lb

## 2020-10-23 DIAGNOSIS — E7800 Pure hypercholesterolemia, unspecified: Secondary | ICD-10-CM | POA: Diagnosis not present

## 2020-10-23 DIAGNOSIS — Z720 Tobacco use: Secondary | ICD-10-CM | POA: Diagnosis not present

## 2020-10-23 DIAGNOSIS — I2510 Atherosclerotic heart disease of native coronary artery without angina pectoris: Secondary | ICD-10-CM

## 2020-10-23 MED ORDER — ASPIRIN EC 81 MG PO TBEC: 81.0000 mg | DELAYED_RELEASE_TABLET | Freq: Every day | ORAL | 11 refills | Status: AC

## 2020-10-23 MED ORDER — ROSUVASTATIN CALCIUM 40 MG PO TABS: 40.0000 mg | ORAL_TABLET | Freq: Every day | ORAL | 3 refills | Status: DC

## 2020-10-23 NOTE — Patient Instructions (Signed)
Medication Instructions:  Your physician has recommended you make the following change in your medication:   INCREASE: Rosuvastatin (Crestor) to 40mg  daily  *If you need a refill on your cardiac medications before your next appointment, please call your pharmacy*   Lab Work: Your physician recommends that you return for a FASTING lipid profile:   If you have labs (blood work) drawn today and your tests are completely normal, you will receive your results only by: [place] (if you have MyChart) OR A paper copy in the mail If you have any lab test that is abnormal or we need to change your treatment, we will call you to review the results.   Follow-Up: At [facility], you and your health needs are our priority.  As part of our continuing mission to provide you with exceptional heart care, we have created designated Provider Care Teams.  These Care Teams include your primary Cardiologist (physician) and Advanced Practice Providers (APPs -  Physician Assistants and Nurse Practitioners) who all work together to provide you with the care you need, when you need it.  We recommend signing up for the patient portal called "MyChart".  Sign up information is provided on this After Visit Summary.  MyChart is used to connect with patients for Virtual Visits (Telemedicine).  Patients are able to view lab/test results, encounter notes, upcoming appointments, etc.  Non-urgent messages can be sent to your provider as well.   To learn more about what you can do with MyChart, go to [email or web address].    Your next appointment:   1 year(s)  The format for your next appointment:   In Person  Provider:   You may see Jenkins Rouge, MD or one of the following Advanced Practice Providers on your designated Care Team:   Cecilie Kicks, NP   Other Instructions Managing the Challenge of Quitting Smoking Quitting smoking is a physical and mental challenge. You will face cravings, withdrawal  symptoms, and temptation. Before quitting, work with your health care provider to make a plan that can help you manage quitting. Preparation canhelp you quit and keep you from giving in. How to manage lifestyle changes Managing stress Stress can make you want to smoke, and wanting to smoke may cause stress. It is important to find ways to manage your stress. You might try some of the following: Practice relaxation techniques. Breathe slowly and deeply, in through your nose and out through your mouth. Listen to music. Soak in a bath or take a shower. Imagine a peaceful place or vacation. Get some support. Talk with family or friends about your stress. Join a support group. Talk with a counselor or therapist. Get some physical activity. Go for a walk, run, or bike ride. Play a favorite sport. Practice yoga.  Medicines Talk with your health care provider about medicines that might help you dealwith cravings and make quitting easier for you. Relationships Social situations can be difficult when you are quitting smoking. To manage this, you can: Avoid parties and other social situations where people might be smoking. Avoid alcohol. Leave right away if you have the urge to smoke. Explain to your family and friends that you are quitting smoking. Ask for support and let them know you might be a bit grumpy. Plan activities where smoking is not an option. General instructions Be aware that many people gain weight after they quit smoking. However, not everyone does. To keep from gaining weight, have a plan in place before you quit and stick  to the plan after you quit. Your plan should include: Having healthy snacks. When you have a craving, it may help to: Eat popcorn, carrots, celery, or other cut vegetables. Chew sugar-free gum. Changing how you eat. Eat small portion sizes at meals. Eat 4-6 small meals throughout the day instead of 1-2 large meals a day. Be mindful when you eat. Do not  watch television or do other things that might distract you as you eat. Exercising regularly. Make time to exercise each day. If you do not have time for a long workout, do short bouts of exercise for 5-10 minutes several times a day. Do some form of strengthening exercise, such as weight lifting. Do some exercise that gets your heart beating and causes you to breathe deeply, such as walking fast, running, swimming, or biking. This is very important. Drinking plenty of water or other low-calorie or no-calorie drinks. Drink 6-8 glasses of water daily.  How to recognize withdrawal symptoms Your body and mind may experience discomfort as you try to get used to not having nicotine in your system. These effects are called withdrawal symptoms. They may include: Feeling hungrier than normal. Having trouble concentrating. Feeling irritable or restless. Having trouble sleeping. Feeling depressed. Craving a cigarette. To manage withdrawal symptoms: Avoid places, people, and activities that trigger your cravings. Remember why you want to quit. Get plenty of sleep. Avoid coffee and other caffeinated drinks. These may worsen some of your symptoms. These symptoms may surprise you. But be assured that they are normal to havewhen quitting smoking. How to manage cravings Come up with a plan for how to deal with your cravings. The plan should include the following: A definition of the specific situation you want to deal with. An alternative action you will take. A clear idea for how this action will help. The name of someone who might help you with this. Cravings usually last for 5-10 minutes. Consider taking the following actions to help you with your plan to deal with cravings: Keep your mouth busy. Chew sugar-free gum. Suck on hard candies or a straw. Brush your teeth. Keep your hands and body busy. Change to a different activity right away. Squeeze or play with a ball. Do an activity or a hobby,  such as making bead jewelry, practicing needlepoint, or working with wood. Mix up your normal routine. Take a short exercise break. Go for a quick walk or run up and down stairs. Focus on doing something kind or helpful for someone else. Call a friend or family member to talk during a craving. Join a support group. Contact a quitline. Where to find support To get help or find a support group: Call the [facility]'s Smoking Quitline: 1-800-QUIT NOW (715)689-7109) Visit the website of the Substance Abuse and [place]: ktimeonline.com Text QUIT to SmokefreeTXT: 737106 Where to find more information Visit these websites to find more information on quitting smoking: [place]: www.smokefree.gov American Lung Association: www.lung.org American Cancer Society: www.cancer.org Centers for Disease Control and Prevention: http://www.wolf.info/ American Heart Association: www.heart.org Contact a health care provider if: You want to change your plan for quitting. The medicines you are taking are not helping. Your eating feels out of control or you cannot sleep. Get help right away if: You feel depressed or become very anxious. Summary Quitting smoking is a physical and mental challenge. You will face cravings, withdrawal symptoms, and temptation to smoke again. Preparation can help you as you go through these challenges.  Try different techniques to manage stress, handle social situations, and prevent weight gain. You can deal with cravings by keeping your mouth busy (such as by chewing gum), keeping your hands and body busy, calling family or friends, or contacting a quitline for people who want to quit smoking. You can deal with withdrawal symptoms by avoiding places where people smoke, getting plenty of rest, and avoiding drinks with caffeine. This information is not intended to replace advice given to you by your health care provider. Make sure you  discuss any questions you have with your healthcare provider. Document Revised: 01/10/2019 Document Reviewed: 01/10/2019 Elsevier Patient Education  [place].

## 2021-01-23 ENCOUNTER — Other Ambulatory Visit: Payer: 59

## 2021-01-23 ENCOUNTER — Other Ambulatory Visit: Payer: Self-pay

## 2021-01-23 DIAGNOSIS — E7800 Pure hypercholesterolemia, unspecified: Secondary | ICD-10-CM

## 2021-01-23 DIAGNOSIS — I2510 Atherosclerotic heart disease of native coronary artery without angina pectoris: Secondary | ICD-10-CM

## 2021-01-23 DIAGNOSIS — Z720 Tobacco use: Secondary | ICD-10-CM

## 2021-01-23 LAB — LIPID PANEL
Chol/HDL Ratio: 2 ratio (ref 0.0–5.0)
Cholesterol, Total: 180 mg/dL (ref 100–199)
HDL: 91 mg/dL (ref >39)
LDL Chol Calc (NIH): 79 mg/dL (ref 0–99)
Triglycerides: 49 mg/dL (ref 0–149)
VLDL Cholesterol Cal: 10 mg/dL (ref 5–40)

## 2021-01-24 ENCOUNTER — Telehealth: Payer: Self-pay | Admitting: Cardiovascular Disease

## 2021-01-24 DIAGNOSIS — E7800 Pure hypercholesterolemia, unspecified: Secondary | ICD-10-CM

## 2021-01-24 MED ORDER — ROSUVASTATIN CALCIUM 40 MG PO TABS
40.0000 mg | ORAL_TABLET | Freq: Every day | ORAL | 3 refills | Status: DC
Start: 2021-01-24 — End: 2022-04-17

## 2021-01-24 MED ORDER — EZETIMIBE 10 MG PO TABS: 10.0000 mg | ORAL_TABLET | Freq: Every day | ORAL | 3 refills | Status: DC

## 2021-01-24 NOTE — Telephone Encounter (Signed)
Calling to go over lab results. Please advise

## 2021-01-24 NOTE — Telephone Encounter (Signed)
Imogene Burn, PA-C  01/24/2021  9:46 AM EDT Back to Top    LDL has come down from 90 to 79 with increase in crestor. Goal is less than 70. Please add zetia 10 mg daily and repeat FLP in 3 months. Also smoking cessation and low saturated fat/limited processed foods recommended. thanks    Called patient's wife back with results. Orders placed for labs and Zetia. Made appointment for lab work. Patient's wife verbalized understanding.

## 2021-03-21 ENCOUNTER — Other Ambulatory Visit: Payer: Self-pay | Admitting: Urology

## 2021-03-21 NOTE — H&P (Signed)
Office Visit Report     03/21/2021   --------------------------------------------------------------------------------   Xavier Goodwin. Xavier Goodwin  MRN: [id number]  DOB: 10/05/1960, 60 year old Male  SSN-[id number]   PRIMARY CARE:  L [name], MD  REFERRING:  Donavan Burnet, MD  PROVIDER:  Raynelle Bring, M.D.  LOCATION:  [facility] [phone]     --------------------------------------------------------------------------------   CC/HPI: 1. Locally advanced prostate cancer  2. Erectile dysfunction  3. Urolithiasis   For full details, please see my note from 03/04/2021. [name] was noted to incidentally have a large amount of microscopic hematuria at his last visit. He therefore proceeded with a hematuria protocol CT scan. This demonstrated multiple distal right ureteral calculi with the largest measuring approximately 7 to 8 mm. He fortunately has had minimal pain symptoms. He denies any fever. He has not passed any stones since his last visit. He has not noted any gross hematuria.     ALLERGIES: No Known Allergies    MEDICATIONS: Sildenafil Citrate 100 mg tablet 1 tablet PO as directed PRN  Tadalafil 20 mg tablet 1 tablet PO prn as directed  Rosuvastatin Calcium 20 mg tablet     GU PSH: Cysto Remove Stent FB Sim - 2018 Cystoscopy Insert Stent, Right - 2018 ESWL - 2018, Evans - 2006 Laparoscopy; Lymphadenectomy - 2018 Locm 300-399Mg /Ml Iodine,1Ml - 03/14/2021, 2018 Prostate Needle Biopsy - 2018 Robotic Radical Prostatectomy - 2018     NON-GU PSH: Colonoscopy - 2016 Elbow Surgery (Unspecified), Left - 1976 Surgical Pathology, Gross And Microscopic Examination For Prostate Needle - 2018     GU PMH: Microscopic hematuria - 03/14/2021, - 03/04/2021 ED due to arterial insufficiency - 03/04/2021, - 06/21/2020, - 2018 Prostate Cancer - 03/04/2021, - 06/21/2020, - 2018, - 2018 Renal calculus (Stable) - 2018 Ureteral calculus - 2018 Stress Incontinence -  2018 Urinary Frequency - 2018 Urinary Urgency - 2018      PMH Notes:   1) Prostate cancer: He is s/p a BNS RAL radical prostatectomy and BPLND on 07/02/16. He completed salvage treatment with ADT (18 months) and pelvic/prostatic fossa radiation (Oct-Dec 2018).   Diagnosis: pT3a N0 Mx, Gleason 4+3=7 adenocarcinoma with positive surgical margins (bilateral posterior base)  Pretreatment PSA: 13.7  Pretreatment SHIM score: 23   2) Urolithiasis: He presented in May 2018 with an obstructing left ureteral stone and infection.   May 2018: Left ureteral stent for 7 mm stone and infection  May 2018: Left ESWL   3) Hematuria: He was noted to have microscopic hematuria in November 2022.   Dec 2022: CT imaging - , Cystoscopy -    NON-GU PMH: Hypercholesterolemia Sickle-cell trait    FAMILY HISTORY: Atrial Fibrillation - Mother Congestive Heart Failure - Mother Diabetes - Mother father deceased at age 61 - Father Heart Attack - Father, Mother Hypertension - Mother, Father kidney disease - Mother Lung Cancer - Sister lupus - Sister sickle cell trait - Runs in Family   SOCIAL HISTORY: Marital Status: Married Preferred Language: English; Race: Black or African American Current Smoking Status: Patient smokes. Has smoked since 04/06/1986. Smokes 1 pack per day.   Tobacco Use Assessment Completed: Used Tobacco in last 30 days? Has never drank.  Does not drink caffeine. Patient's occupation is/was Retired- [employer].    REVIEW OF SYSTEMS:    GU Review Male:   Patient denies frequent urination, hard to postpone urination, burning/ pain with urination, get up at night to urinate, leakage of urine, stream  starts and stops, trouble starting your streams, and have to strain to urinate .  Gastrointestinal (Lower):   Patient denies diarrhea and constipation.  Gastrointestinal (Upper):   Patient denies nausea and vomiting.  Constitutional:   Patient denies fever, night sweats, weight loss, and  fatigue.  Skin:   Patient denies skin rash/ lesion and itching.  Eyes:   Patient denies blurred vision and double vision.  Ears/ Nose/ Throat:   Patient denies sore throat and sinus problems.  Hematologic/Lymphatic:   Patient denies swollen glands and easy bruising.  Cardiovascular:   Patient denies leg swelling and chest pains.  Respiratory:   Patient denies cough and shortness of breath.  Endocrine:   Patient denies excessive thirst.  Musculoskeletal:   Patient denies back pain and joint pain.  Neurological:   Patient denies headaches and dizziness.  Psychologic:   Patient denies depression and anxiety.   VITAL SIGNS:      03/21/2021 04:00 PM  Weight 225 lb / 102.06 kg  Height 73 in / 185.42 cm  BP 111/69 mmHg  Pulse 73 /min  Temperature 98.1 F / 36.7 C  BMI 29.7 kg/m   MULTI-SYSTEM PHYSICAL EXAMINATION:    Constitutional: Well-nourished. No physical deformities. Normally developed. Good grooming.  Respiratory: No labored breathing, no use of accessory muscles. Clear bilaterally.  Cardiovascular: Normal temperature, normal extremity pulses, no swelling, no varicosities. Regular rate and rhythm.  Gastrointestinal: No mass, no tenderness, no rigidity, non obese abdomen. No CVA tenderness.     Complexity of Data:  Records Review:   Previous Patient Records  X-Ray Review: C.T. Abdomen/Pelvis: Reviewed Films.     02/25/21 06/14/20 11/20/19 05/18/19 11/11/18 06/29/18 12/22/17 06/11/17  PSA  Total PSA <0.015 ng/mL <0.015 ng/mL <0.015 ng/mL 0.01 ng/mL <0.015 ng/mL <0.015 ng/mL <0.015 ng/mL 0.018     05/18/19 11/11/18 06/29/18 12/22/17  Hormones  Testosterone, Total 351.2 ng/dL 351.5 ng/dL 13.8 ng/dL 15.1 ng/dL   Notes:                     CLINICAL DATA: Microhematuria. History of prostate cancer with  prostatectomy and radiation therapy. History of renal stones and  lithotripsy.   EXAM:  CT ABDOMEN AND PELVIS WITHOUT AND WITH CONTRAST   TECHNIQUE:  Multidetector CT imaging  of the abdomen and pelvis was performed  following the standard protocol before and following the bolus  administration of intravenous contrast.   CONTRAST: 125 cc Omnipaque 300.   COMPARISON: 08/10/2016.   FINDINGS:  Lower chest: Minimal scarring in the lingula. Bases are otherwise  clear. Heart is at the upper limits of normal in size. No  pericardial or pleural effusion. Distal esophagus is grossly  unremarkable.   Hepatobiliary: Liver is slightly decreased in attenuation diffusely.  Subcentimeter low-attenuation lesion in the left hepatic lobe  (5/21), too small to characterize. Stone in the gallbladder. No  biliary ductal dilatation.   Pancreas: Negative.   Spleen: Negative.   Adrenals/Urinary Tract: Adrenal glands are unremarkable. Severe  right hydronephrosis secondary to a 7 x 8 mm distal right ureteral  stone (2/68 and coronal series 601/image 78). Additional 5 mm stone  more distally in right ureter, just proximal to the ureterovesical  junction. Finally, a 7 mm stone is seen in the bladder, near the  right ureteral orifice. Associated decreased right renal parenchymal  attenuation with very minimal excretion of contrast on nephrographic  phase imaging. Two cysts in the interpolar left kidney measure 1.6  cm and  1.8 cm, respectively. Other low-attenuation lesions in the  kidneys are too small to definitively characterize. No additional  urinary stones. Left ureter is decompressed. No additional filling  defects in the opacified portions of the left intrarenal collecting  system, ureter and bladder. Chronic bladder wall thickening. Trans  urethral resection of the prostate defect in the bladder.   Stomach/Bowel: Stomach, small bowel, appendix and colon are  unremarkable.   Vascular/Lymphatic: Atherosclerotic calcification of the aorta. No  pathologically enlarged lymph nodes.   Reproductive: Prostatectomy.   Other: No free fluid. Mesenteries and peritoneum are  unremarkable.  Left hemidiaphragm is elevated as before.   Musculoskeletal: Degenerative changes in the spine and hips. No  worrisome lytic or sclerotic lesions.   IMPRESSION:  1. Severe right hydronephrosis secondary to stones in the distal  right ureter and bladder, as detailed above. Associated decreased  right renal function. These results will be called to the ordering  clinician or representative by the Radiologist Assistant, and  communication documented in the PACS or [employer].  2. Prostatectomy. No evidence of metastatic disease.  3. Hepatic steatosis.  4. Cholelithiasis.  5. Aortic atherosclerosis (ICD10-I70.0).    Electronically Signed  By: Lorin Picket M.D.  On: 03/17/2021 09:36   PROCEDURES:         KUB - [id number]  A single view of the abdomen is obtained.      . Patient confirmed No Neulasta OnPro Device.           Urinalysis Dipstick Dipstick Cont'd  Color: Yellow Bilirubin: Neg mg/dL  Appearance: Clear Ketones: Neg mg/dL  Specific Gravity: 1.015 Blood: Neg ery/uL  pH: 6.5 Protein: Trace mg/dL  Glucose: Neg mg/dL Urobilinogen: 0.2 mg/dL    Nitrites: Neg    Leukocyte Esterase: Neg leu/uL    ASSESSMENT:      ICD-10 Details  1 GU:   Ureteral calculus - N20.1    PLAN:            Medications New Meds: Hydrocodone-Acetaminophen 5 mg-325 mg tablet 1-2 tablet PO Q 6 H prn   #15  0 Refill(s)  Tamsulosin Hcl 0.4 mg capsule 1 capsule PO Q HS   #14  0 Refill(s)            Schedule Return Visit/Planned Activity: Other See Visit Notes             Note: We will schedule surgery.          Document Letter(s):  Created for Patient: Clinical Summary         Notes:   1. Prostate cancer: He will keep his scheduled appointment in the spring for ongoing PSA surveillance.   2. Erectile dysfunction: Continue sildenafil as needed. He has had some mild interest in a vacuum erection device.   3. Multiple right ureteral calculi: We discussed options of  medical expulsion therapy versus surgical intervention. After reviewing the pros and cons of treatment options, he does wish to proceed with right ureteroscopic laser lithotripsy and right ureteral stent placement. We have reviewed the potential risks, complications, and expected recovery process associated with this procedure. He gives informed consent. This will be scheduled for early next week.   CC: Dr. [name]        Next Appointment:      Next Appointment: 03/24/2021 03:15 PM    Appointment Type: Surgery     Location: [facility] [phone]    Provider: Raynelle Bring, M.D.  Reason for Visit: WL/OP CYSTO, RT RPG, RT URS LL, RT UR STENT

## 2021-03-21 NOTE — Progress Notes (Signed)
Pt aware to arrive at [facility] admitting at 1300 on Monday 03/24/2021 for scheduled surgical procedure. Pt aware no food after midnight; clear liquids from midnight till 1200 noon then nothing by mouth. No medication day of surgery.

## 2021-03-24 ENCOUNTER — Ambulatory Visit (HOSPITAL_COMMUNITY): Payer: 59

## 2021-03-24 ENCOUNTER — Encounter (HOSPITAL_COMMUNITY)
Admission: RE | Disposition: A | Discharge status: Home / Self Care | Payer: Self-pay | Source: Home / Self Care | Attending: Urology

## 2021-03-24 ENCOUNTER — Ambulatory Visit (HOSPITAL_COMMUNITY)
Admission: RE | Admit: 2021-03-24 | Discharge: 2021-03-24 | Disposition: A | Discharge status: Home / Self Care | Payer: 59 | Attending: Urology | Admitting: Urology

## 2021-03-24 ENCOUNTER — Encounter (HOSPITAL_COMMUNITY): Payer: Self-pay | Admitting: Urology

## 2021-03-24 ENCOUNTER — Ambulatory Visit (HOSPITAL_COMMUNITY): Payer: 59 | Admitting: Anesthesiology

## 2021-03-24 DIAGNOSIS — N201 Calculus of ureter: Secondary | ICD-10-CM | POA: Insufficient documentation

## 2021-03-24 DIAGNOSIS — F17210 Nicotine dependence, cigarettes, uncomplicated: Secondary | ICD-10-CM | POA: Diagnosis not present

## 2021-03-24 DIAGNOSIS — N529 Male erectile dysfunction, unspecified: Secondary | ICD-10-CM | POA: Diagnosis not present

## 2021-03-24 DIAGNOSIS — C61 Malignant neoplasm of prostate: Secondary | ICD-10-CM | POA: Insufficient documentation

## 2021-03-24 HISTORY — PX: CYSTOSCOPY/URETEROSCOPY/HOLMIUM LASER/STENT PLACEMENT: SHX6546

## 2021-03-24 LAB — BASIC METABOLIC PANEL
Anion gap: 7 (ref 5–15)
BUN: 13 mg/dL (ref 8–23)
CO2: 23 mmol/L (ref 22–32)
Calcium: 9.2 mg/dL (ref 8.9–10.3)
Chloride: 111 mmol/L (ref 98–111)
Creatinine, Ser: 0.96 mg/dL (ref 0.61–1.24)
GFR, Estimated: >60 mL/min (ref >60)
Glucose, Bld: 102 mg/dL — ABNORMAL HIGH (ref 70–99)
Potassium: 3.8 mmol/L (ref 3.5–5.1)
Sodium: 141 mmol/L (ref 135–145)

## 2021-03-24 SURGERY — CYSTOSCOPY/URETEROSCOPY/HOLMIUM LASER/STENT PLACEMENT
Anesthesia: General | Site: Ureter | Laterality: Right

## 2021-03-24 MED ORDER — ONDANSETRON HCL 4 MG/2ML IJ SOLN
INTRAMUSCULAR | Status: DC | PRN
  Administered 2021-03-24: 4 mg via INTRAVENOUS

## 2021-03-24 MED ORDER — LIDOCAINE 2% (20 MG/ML) 5 ML SYRINGE
INTRAMUSCULAR | Status: DC | PRN
  Administered 2021-03-24: 100 mg via INTRAVENOUS

## 2021-03-24 MED ORDER — IOHEXOL 300 MG/ML  SOLN
INTRAMUSCULAR | Status: DC | PRN
  Administered 2021-03-24: 16:00:00 10 mL

## 2021-03-24 MED ORDER — PROPOFOL 10 MG/ML IV BOLUS
INTRAVENOUS | Status: AC
  Filled 2021-03-24: qty 20

## 2021-03-24 MED ORDER — PROPOFOL 10 MG/ML IV BOLUS
INTRAVENOUS | Status: DC | PRN
  Administered 2021-03-24: 200 mg via INTRAVENOUS

## 2021-03-24 MED ORDER — KETOROLAC TROMETHAMINE 30 MG/ML IJ SOLN
INTRAMUSCULAR | Status: DC | PRN
  Administered 2021-03-24: 30 mg via INTRAVENOUS

## 2021-03-24 MED ORDER — OXYCODONE HCL 5 MG/5ML PO SOLN: 5.0000 mg | Freq: Once | ORAL | Status: DC | PRN

## 2021-03-24 MED ORDER — ONDANSETRON HCL 4 MG/2ML IJ SOLN: 4.0000 mg | Freq: Once | INTRAMUSCULAR | Status: DC | PRN

## 2021-03-24 MED ORDER — ACETAMINOPHEN 500 MG PO TABS
1000.0000 mg | ORAL_TABLET | Freq: Once | ORAL | Status: AC
  Administered 2021-03-24: 14:00:00 1000 mg via ORAL
  Filled 2021-03-24: qty 2

## 2021-03-24 MED ORDER — FENTANYL CITRATE PF 50 MCG/ML IJ SOSY: 25.0000 ug | PREFILLED_SYRINGE | INTRAMUSCULAR | Status: DC | PRN

## 2021-03-24 MED ORDER — DEXAMETHASONE SODIUM PHOSPHATE 4 MG/ML IJ SOLN
INTRAMUSCULAR | Status: DC | PRN
  Administered 2021-03-24: 10 mg via INTRAVENOUS

## 2021-03-24 MED ORDER — PHENYLEPHRINE 40 MCG/ML (10ML) SYRINGE FOR IV PUSH (FOR BLOOD PRESSURE SUPPORT)
PREFILLED_SYRINGE | INTRAVENOUS | Status: DC | PRN
  Administered 2021-03-24 (×2): 80 ug via INTRAVENOUS

## 2021-03-24 MED ORDER — FENTANYL CITRATE (PF) 100 MCG/2ML IJ SOLN
INTRAMUSCULAR | Status: DC | PRN
  Administered 2021-03-24 (×2): 25 ug via INTRAVENOUS
  Administered 2021-03-24: 50 ug via INTRAVENOUS
  Administered 2021-03-24 (×2): 25 ug via INTRAVENOUS
  Administered 2021-03-24: 50 ug via INTRAVENOUS

## 2021-03-24 MED ORDER — SODIUM CHLORIDE 0.9 % IR SOLN
Status: DC | PRN
  Administered 2021-03-24: 3000 mL

## 2021-03-24 MED ORDER — PHENYLEPHRINE 40 MCG/ML (10ML) SYRINGE FOR IV PUSH (FOR BLOOD PRESSURE SUPPORT)
PREFILLED_SYRINGE | INTRAVENOUS | Status: AC
  Filled 2021-03-24: qty 10

## 2021-03-24 MED ORDER — MIDAZOLAM HCL 2 MG/2ML IJ SOLN
INTRAMUSCULAR | Status: DC | PRN
  Administered 2021-03-24: 2 mg via INTRAVENOUS

## 2021-03-24 MED ORDER — SODIUM CHLORIDE 0.9 % IR SOLN
Status: DC | PRN
  Administered 2021-03-24: 1000 mL

## 2021-03-24 MED ORDER — CEFAZOLIN SODIUM-DEXTROSE 2-4 GM/100ML-% IV SOLN
2.0000 g | Freq: Once | INTRAVENOUS | Status: AC
  Administered 2021-03-24: 16:00:00 2 g via INTRAVENOUS
  Filled 2021-03-24: qty 100

## 2021-03-24 MED ORDER — KETOROLAC TROMETHAMINE 30 MG/ML IJ SOLN
INTRAMUSCULAR | Status: AC
  Filled 2021-03-24: qty 1

## 2021-03-24 MED ORDER — LACTATED RINGERS IV SOLN: INTRAVENOUS | Status: DC

## 2021-03-24 MED ORDER — OXYCODONE HCL 5 MG PO TABS: 5.0000 mg | ORAL_TABLET | Freq: Once | ORAL | Status: DC | PRN

## 2021-03-24 MED ORDER — AMISULPRIDE (ANTIEMETIC) 5 MG/2ML IV SOLN: 10.0000 mg | Freq: Once | INTRAVENOUS | Status: DC | PRN

## 2021-03-24 MED ORDER — FENTANYL CITRATE (PF) 100 MCG/2ML IJ SOLN
INTRAMUSCULAR | Status: AC
  Filled 2021-03-24: qty 2

## 2021-03-24 SURGICAL SUPPLY — 26 items
BAG COUNTER SPONGE SURGICOUNT (BAG) IMPLANT
BAG SPNG CNTER NS LX DISP (BAG)
BAG SURGICOUNT SPONGE COUNTING (BAG)
BAG URO CATCHER STRL LF (MISCELLANEOUS) ×3 IMPLANT
BASKET ZERO TIP NITINOL 2.4FR (BASKET) ×2 IMPLANT
BSKT STON RTRVL ZERO TP 2.4FR (BASKET) ×1
CATH URETL OPEN END 6FR 70 (CATHETERS) IMPLANT
CLOTH BEACON ORANGE TIMEOUT ST (SAFETY) ×3 IMPLANT
GLOVE SURG ENC TEXT LTX SZ7.5 (GLOVE) ×3 IMPLANT
GOWN STRL REUS W/TWL LRG LVL3 (GOWN DISPOSABLE) ×3 IMPLANT
GUIDEWIRE STR DUAL SENSOR (WIRE) ×3 IMPLANT
GUIDEWIRE ZIPWRE .038 STRAIGHT (WIRE) ×2 IMPLANT
IV NS 1000ML (IV SOLUTION) ×3
IV NS 1000ML BAXH (IV SOLUTION) ×1 IMPLANT
KIT TURNOVER KIT A (KITS) IMPLANT
LASER FIB FLEXIVA PULSE ID 365 (Laser) ×2 IMPLANT
MANIFOLD NEPTUNE II (INSTRUMENTS) ×3 IMPLANT
PACK CYSTO (CUSTOM PROCEDURE TRAY) ×3 IMPLANT
SHEATH NAVIGATOR HD 11/13X28 (SHEATH) ×2 IMPLANT
SHEATH NAVIGATOR HD 11/13X36 (SHEATH) IMPLANT
STENT URET 6FRX26 CONTOUR (STENTS) ×2 IMPLANT
TRACTIP FLEXIVA PULS ID 200XHI (Laser) IMPLANT
TRACTIP FLEXIVA PULSE ID 200 (Laser)
TUBING CONNECTING 10 (TUBING) ×2 IMPLANT
TUBING CONNECTING 10' (TUBING) ×1
TUBING UROLOGY SET (TUBING) ×3 IMPLANT

## 2021-03-24 NOTE — Transfer of Care (Signed)
Immediate Anesthesia Transfer of Care Note  Patient: Xavier Goodwin  Procedure(s) Performed: CYSTOSCOPY/RETROGRADE/URETEROSCOPY/HOLMIUM LASER/STENT PLACEMENT (Right: Ureter)  Patient Location: PACU  Anesthesia Type:General  Level of Consciousness: awake, alert  and oriented  Airway & Oxygen Therapy: Patient Spontanous Breathing and Patient connected to face mask oxygen  Post-op Assessment: Report given to RN and Post -op Vital signs reviewed and stable  Post vital signs: Reviewed and stable  Last Vitals:  Vitals Value Taken Time  BP 118/71 03/24/21 1710  Temp    Pulse 61 03/24/21 1712  Resp 17 03/24/21 1712  SpO2 100 % 03/24/21 1712  Vitals shown include unvalidated device data.  Last Pain:  Vitals:   03/24/21 1313  TempSrc: Oral  PainSc: 0-No pain      Patients Stated Pain Goal: 3 (03/88/82 8003)  Complications: No notable events documented.

## 2021-03-24 NOTE — Op Note (Signed)
Preoperative diagnosis: Right ureteral calculi  Postoperative diagnosis: Right ureteral calculi  Procedures: 1.  Cystoscopy 2.  Right retrograde pyelography with interpretation 3.  Right ureteroscopy with laser lithotripsy and stone removal 4.  Right ureteral stent placement (6 x 26-no string)  Surgeon: Pryor Curia MD  Anesthesia: General  Complications: None  EBL: Minimal  Intraoperative findings: A right retrograde pyelogram was performed utilizing a 6 French ureteral catheter and Omnipaque contrast.  This revealed multiple filling defects within the distal right ureter consistent with the patient's known right ureteral calculi.  There was noted to be a large gallstone on fluoroscopic imaging.  Specimens: Right ureteral calculi  Disposition of specimens: [facility]  Indication: Xavier Goodwin is a 60 year old gentleman who was recently noted to have microscopic hematuria.  He underwent a CT scan with hematuria protocol and was found to have multiple distal right ureteral calculi.  After reviewing options and understand the small likelihood that he would be able to spontaneously pass all of the stones based on their number and size, he elected to proceed with the above procedures.  The potential risks, complications, and expected recovery process were discussed in detail.  Informed consent was obtained.  Description of procedure: The patient was taken the operating room and a general anesthetic was administered.  He was given preoperative antibiotics, placed in the dorsolithotomy position, and prepped and draped in the usual sterile fashion.  Next, a preoperative timeout was performed.  Cystourethroscopy was then performed which revealed a mild bladder neck stenosis.  His prostate was surgically absent consistent with his history of a prior radical prostatectomy.  On entering of the bladder, there were no bladder tumors, stones, or other mucosal pathology.  There  was a stone crowning at the right ureteral orifice.  Attention turned to the right ureter and a 6 French ureteral catheter was used to cannulate the right ureteral orifice.  Omnipaque contrast was injected with findings as dictated above.  There were multiple distal right ureteral calculi noted.  A 0.38 sensor guidewire was then advanced up the right ureter under fluoroscopic guidance.  Semirigid ureteroscopy was performed then with a 6 French ureteroscope.  The most distal stone was identified and was able to be removed intact with a 0 tip nitinol basket.  The neck stone was noted to be larger and required laser lithotripsy.  A 365 m holmium laser fiber was used to fragment the stone on a setting of 0.6 J and 6 Hz.  The stone fragments were removed with a 0 tip nitinol basket.  The ureteroscope was then advanced further proximally and there was noted to be a larger stone that appeared to be impacted.  The stone was difficult to adequately access for laser lithotripsy with the semirigid scope despite being able to partially visualize it.  As such, the semirigid ureteroscope was removed and a short ureteral access sheath was placed into the distal ureter below the level of the stone.  The digital flexible ureteroscope was then used to access the stone and laser lithotripsy was able to be performed adequately.  The stone fragments were then removed with a 0 tip nitinol basket.  Further inspection of the entire ureter was performed with the flexible ureteroscope and no further stones were identified.  In the vicinity of the impacted stone, there was evidence of possible chronic changes placing the patient at risk for ureteral stricture formation.  As such, a right ureteral stent was necessary to be placed.  The ureteroscope was removed and replaced with the cystoscope.  The wire was backloaded on the cystoscope and a 6 x 26 double-J ureteral stent was advanced over the wire using Seldinger technique and positioned  appropriately under fluoroscopic and cystoscopic guidance.  The wire was removed with a good curl noted in the renal pelvis as well as within the bladder.  Any remaining fragments in the bladder that had been left there during ureteroscopy were then removed.  The bladder was emptied and the procedure was ended.  He tolerated the procedure well without complications.  He was able to be awakened and transferred to the recovery unit in satisfactory condition.

## 2021-03-24 NOTE — Discharge Instructions (Signed)

## 2021-03-24 NOTE — Anesthesia Procedure Notes (Signed)
Procedure Name: LMA Insertion Date/Time: 03/24/2021 3:45 PM Performed by: Sharlette Dense, CRNA Patient Re-evaluated:Patient Re-evaluated prior to induction Preoxygenation: Pre-oxygenation with 100% oxygen Induction Type: IV induction LMA: LMA inserted LMA Size: 4.0 Number of attempts: 1 Placement Confirmation: positive ETCO2 and breath sounds checked- equal and bilateral Tube secured with: Tape Dental Injury: Teeth and Oropharynx as per pre-operative assessment

## 2021-03-24 NOTE — Anesthesia Preprocedure Evaluation (Signed)
Anesthesia Evaluation  Patient identified by MRN, date of birth, ID band Patient awake    Reviewed: Allergy & Precautions, NPO status , Patient's Chart, lab work & pertinent test results  History of Anesthesia Complications Negative for: history of anesthetic complications  Airway Mallampati: II  TM Distance: >3 FB Neck ROM: Full    Dental  (+) Dental Advisory Given   Pulmonary Current Smoker and Patient abstained from smoking.,    Pulmonary exam normal        Cardiovascular + CAD (mild by 2017 cath)  Normal cardiovascular exam     Neuro/Psych negative neurological ROS     GI/Hepatic negative GI ROS, Neg liver ROS,   Endo/Other  negative endocrine ROS  Renal/GU Renal disease (R ureteral calculi)   Prostate cancer    Musculoskeletal negative musculoskeletal ROS (+)   Abdominal   Peds  Hematology negative hematology ROS (+)   Anesthesia Other Findings   Reproductive/Obstetrics                             Anesthesia Physical Anesthesia Plan  ASA: 2  Anesthesia Plan: General   Post-op Pain Management: Toradol IV (intra-op) and Tylenol PO (pre-op)   Induction: Intravenous  PONV Risk Score and Plan: 1 and Ondansetron, Dexamethasone, Midazolam and Treatment may vary due to age or medical condition  Airway Management Planned: LMA  Additional Equipment: None  Intra-op Plan:   Post-operative Plan: Extubation in OR  Informed Consent: I have reviewed the patients History and Physical, chart, labs and discussed the procedure including the risks, benefits and alternatives for the proposed anesthesia with the patient or authorized representative who has indicated his/her understanding and acceptance.     Dental advisory given  Plan Discussed with:   Anesthesia Plan Comments:         Anesthesia Quick Evaluation

## 2021-03-24 NOTE — Interval H&P Note (Signed)
History and Physical Interval Note:  03/24/2021 3:00 PM  Xavier Goodwin  has presented today for surgery, with the diagnosis of RIGHT URETERAL CALCULI.  The various methods of treatment have been discussed with the patient and family. After consideration of risks, benefits and other options for treatment, the patient has consented to  Procedure(s): CYSTOSCOPY/RETROGRADE/URETEROSCOPY/HOLMIUM LASER/STENT PLACEMENT (Right) as a surgical intervention.  The patient's history has been reviewed, patient examined, no change in status, stable for surgery.  I have reviewed the patient's chart and labs.  Questions were answered to the patient's satisfaction.     [employer]

## 2021-03-25 ENCOUNTER — Encounter (HOSPITAL_COMMUNITY): Payer: Self-pay | Admitting: Urology

## 2021-03-25 NOTE — Anesthesia Postprocedure Evaluation (Signed)
Anesthesia Post Note  Patient: Xavier Goodwin  Procedure(s) Performed: CYSTOSCOPY/RETROGRADE/URETEROSCOPY/HOLMIUM LASER/STENT PLACEMENT (Right: Ureter)     Patient location during evaluation: PACU Anesthesia Type: General Level of consciousness: awake and alert Pain management: pain level controlled Vital Signs Assessment: post-procedure vital signs reviewed and stable Respiratory status: spontaneous breathing, nonlabored ventilation and respiratory function stable Cardiovascular status: blood pressure returned to baseline and stable Postop Assessment: no apparent nausea or vomiting Anesthetic complications: no   No notable events documented.  Last Vitals:  Vitals:   03/24/21 1744 03/24/21 1745  BP: 123/73 114/69  Pulse: 64 66  Resp: 20   Temp: 37 C   SpO2: 100% 100%    Last Pain:  Vitals:   03/24/21 1744  TempSrc: Oral  PainSc: 0-No pain                 Lidia Collum

## 2021-05-02 ENCOUNTER — Other Ambulatory Visit: Payer: Self-pay

## 2021-05-02 ENCOUNTER — Other Ambulatory Visit: Payer: 59 | Admitting: *Deleted

## 2021-05-02 DIAGNOSIS — E7800 Pure hypercholesterolemia, unspecified: Secondary | ICD-10-CM

## 2021-05-02 LAB — LIPID PANEL
Chol/HDL Ratio: 1.8 ratio (ref 0.0–5.0)
Cholesterol, Total: 149 mg/dL (ref 100–199)
HDL: 84 mg/dL (ref >39)
LDL Chol Calc (NIH): 56 mg/dL (ref 0–99)
Triglycerides: 35 mg/dL (ref 0–149)
VLDL Cholesterol Cal: 9 mg/dL (ref 5–40)

## 2021-05-06 ENCOUNTER — Telehealth: Payer: Self-pay | Admitting: Cardiovascular Disease

## 2021-05-06 NOTE — Telephone Encounter (Signed)
Patient's spouse call stating she needs something stating that her husband had a lipid panel done at our office. She said it's for insurance purposes.

## 2021-05-06 NOTE — Telephone Encounter (Signed)
Called patient's wife back (DPR). Patient needs a letter stating he had lipid panel done at our office on 05/02/21. Typed out letter, printed and put it in the box for the office to mail.

## 2021-06-11 ENCOUNTER — Other Ambulatory Visit: Payer: Self-pay | Admitting: Family Medicine

## 2021-06-11 ENCOUNTER — Other Ambulatory Visit (HOSPITAL_COMMUNITY): Payer: Self-pay | Admitting: Adult Health

## 2021-06-11 DIAGNOSIS — F17200 Nicotine dependence, unspecified, uncomplicated: Secondary | ICD-10-CM

## 2021-06-11 DIAGNOSIS — Z122 Encounter for screening for malignant neoplasm of respiratory organs: Secondary | ICD-10-CM

## 2021-06-11 DIAGNOSIS — N130 Hydronephrosis with ureteropelvic junction obstruction: Secondary | ICD-10-CM

## 2021-06-12 ENCOUNTER — Other Ambulatory Visit (HOSPITAL_COMMUNITY): Payer: Self-pay | Admitting: Adult Health

## 2021-06-18 ENCOUNTER — Other Ambulatory Visit: Payer: Self-pay

## 2021-06-18 ENCOUNTER — Ambulatory Visit (HOSPITAL_COMMUNITY)
Admission: RE | Admit: 2021-06-18 | Discharge: 2021-06-18 | Disposition: A | Discharge status: Home / Self Care | Payer: 59 | Source: Ambulatory Visit | Attending: Adult Health | Admitting: Adult Health

## 2021-06-18 DIAGNOSIS — N130 Hydronephrosis with ureteropelvic junction obstruction: Secondary | ICD-10-CM | POA: Insufficient documentation

## 2021-06-18 MED ORDER — FUROSEMIDE 10 MG/ML IJ SOLN
INTRAMUSCULAR | Status: AC
  Administered 2021-06-18: 47 mg via INTRAVENOUS
  Filled 2021-06-18: qty 8

## 2021-06-18 MED ORDER — TECHNETIUM TC 99M MERTIATIDE
5.2000 | Freq: Once | INTRAVENOUS | Status: AC
  Administered 2021-06-18: 5.2 via INTRAVENOUS

## 2021-06-18 MED ORDER — FUROSEMIDE 10 MG/ML IJ SOLN: 47.0000 mg | Freq: Once | INTRAMUSCULAR | Status: AC

## 2021-07-03 ENCOUNTER — Ambulatory Visit
Admission: RE | Admit: 2021-07-03 | Discharge: 2021-07-03 | Disposition: A | Discharge status: Home / Self Care | Payer: 59 | Source: Ambulatory Visit | Attending: Family Medicine | Admitting: Family Medicine

## 2021-07-03 DIAGNOSIS — F17200 Nicotine dependence, unspecified, uncomplicated: Secondary | ICD-10-CM

## 2021-07-03 DIAGNOSIS — Z122 Encounter for screening for malignant neoplasm of respiratory organs: Secondary | ICD-10-CM

## 2021-11-11 ENCOUNTER — Other Ambulatory Visit: Payer: Self-pay | Admitting: Physician Assistant

## 2021-11-27 ENCOUNTER — Other Ambulatory Visit: Payer: Self-pay | Admitting: Physician Assistant

## 2021-12-24 ENCOUNTER — Other Ambulatory Visit: Payer: Self-pay | Admitting: Physician Assistant

## 2022-01-05 ENCOUNTER — Encounter: Payer: Self-pay | Admitting: Neurology

## 2022-01-05 ENCOUNTER — Ambulatory Visit: Payer: 59 | Admitting: Neurology

## 2022-01-05 VITALS — BP 112/75 | HR 70 | Ht 73.0 in | Wt 201.0 lb

## 2022-01-05 DIAGNOSIS — Z72821 Inadequate sleep hygiene: Secondary | ICD-10-CM | POA: Insufficient documentation

## 2022-01-05 DIAGNOSIS — Z8659 Personal history of other mental and behavioral disorders: Secondary | ICD-10-CM

## 2022-01-05 DIAGNOSIS — G4752 REM sleep behavior disorder: Secondary | ICD-10-CM

## 2022-01-05 DIAGNOSIS — F17210 Nicotine dependence, cigarettes, uncomplicated: Secondary | ICD-10-CM

## 2022-01-05 MED ORDER — ALPRAZOLAM 0.5 MG PO TABS: 0.5000 mg | ORAL_TABLET | Freq: Every evening | ORAL | 0 refills | Status: DC | PRN

## 2022-01-05 MED ORDER — MELATONIN 3 MG PO TABS: 3.0000 mg | ORAL_TABLET | Freq: Every day | ORAL | 0 refills | Status: DC

## 2022-01-05 NOTE — Patient Instructions (Signed)
Quality Sleep Information, Adult Quality sleep is important for your mental and physical health. It also improves your quality of life. Quality sleep means you: Are asleep for most of the time you are in bed. Fall asleep within 30 minutes. Wake up no more than once a night. Are awake for no longer than 20 minutes if you do wake up during the night. Most adults need 7-8 hours of quality sleep each night. How can poor sleep affect me? If you do not get enough quality sleep, you may have: Mood swings. Daytime sleepiness. Decreased alertness, reaction time, and concentration. Sleep disorders, such as insomnia and sleep apnea. Difficulty with: Solving problems. Coping with stress. Paying attention. These issues may affect your performance and productivity at work, school, and home. Lack of sleep may also put you at higher risk for accidents, suicide, and risky behaviors. If you do not get quality sleep, you may also be at higher risk for several health problems, including: Infections. Type 2 diabetes. Heart disease. High blood pressure. Obesity. Worsening of long-term conditions, like arthritis, kidney disease, depression, Parkinson's disease, and epilepsy. What actions can I take to get more quality sleep? Sleep schedule and routine Stick to a sleep schedule. Go to sleep and wake up at about the same time each day. Do not try to sleep less on weekdays and make up for lost sleep on weekends. This does not work. Limit naps during the day to 30 minutes or less. Do not take naps in the late afternoon. Make time to relax before bed. Reading, listening to music, or taking a hot bath promotes quality sleep. Make your bedroom a place that promotes quality sleep. Keep your bedroom dark, quiet, and at a comfortable room temperature. Make sure your bed is comfortable. Avoid using electronic devices that give off bright blue light for 30 minutes before bedtime. Your brain perceives bright blue light  as sunlight. This includes television, phones, and computers. If you are lying awake in bed for longer than 20 minutes, get up and do a relaxing activity until you feel sleepy. Lifestyle     Try to get at least 30 minutes of exercise on most days. Do not exercise 2-3 hours before going to bed. Do not use any products that contain nicotine or tobacco. These products include cigarettes, chewing tobacco, and vaping devices, such as e-cigarettes. If you need help quitting, ask your health care provider. Do not drink caffeinated beverages for at least 8 hours before going to bed. Coffee, tea, and some sodas contain caffeine. Do not drink alcohol or eat large meals close to bedtime. Try to get at least 30 minutes of sunlight every day. Morning sunlight is best. Medical concerns Work with your health care provider to treat medical conditions that may affect sleeping, such as: Nasal obstruction. Snoring. Sleep apnea and other sleep disorders. Talk to your health care provider if you think any of your prescription medicines may cause you to have difficulty falling or staying asleep. If you have sleep problems, talk with a sleep consultant. If you think you have a sleep disorder, talk with your health care provider about getting evaluated by a specialist. Where to find more information Sleep Foundation: sleepfoundation.org American Academy of Sleep Medicine: aasm.org Centers for Disease Control and Prevention (CDC): StoreMirror.com.cy Contact a health care provider if: You have trouble getting to sleep or staying asleep. You often wake up very early in the morning and cannot get back to sleep. You have daytime sleepiness. You  have daytime sleep attacks of suddenly falling asleep and sudden muscle weakness (narcolepsy). You have a tingling sensation in your legs with a strong urge to move your legs (restless legs syndrome). You stop breathing briefly during sleep (sleep apnea). You think you have a sleep  disorder or are taking a medicine that is affecting your quality of sleep. Summary Most adults need 7-8 hours of quality sleep each night. Getting enough quality sleep is important for your mental and physical health. Make your bedroom a place that promotes quality sleep, and avoid things that may cause you to have poor sleep, such as alcohol, caffeine, smoking, or large meals. Talk to your health care provider if you have trouble falling asleep or staying asleep. This information is not intended to replace advice given to you by your health care provider. Make sure you discuss any questions you have with your health care provider. Document Revised: 07/16/2021 Document Reviewed: 07/16/2021 Elsevier Patient Education  2023 Elsevier Inc.  

## 2022-01-05 NOTE — Progress Notes (Signed)
SLEEP MEDICINE CLINIC    Provider:  Larey Seat, MD  Primary Care Physician:  Collene Leyden, [name] [place] [address]     Referring Provider: Alroy Dust, L[address]Marl[address] Sa, [address] [place],  [place] [address]          Chief Complaint according to patient   Patient presents with:     New Patient (Initial Visit)           HISTORY OF PRESENT ILLNESS:   Xavier Goodwin is a 61 y.o.  African American male patient who is on 01-05-2022 seen upon  referral by PCP Dr Alroy Dust, 01/05/2022  for a Sleep consultation.  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 Wife noted him to be easily agitated, and defensive. He is hypervigilant. Jeray Shugart Haigler  has a past medical history of Chest pain, Cholelithiasis (08/2016), History of bronchitis, History of kidney stones, History of urinary tract infection, Hypercholesteremia, Pneumonia, Prostate cancer ([facility]), and Sepsis ([facility]) (08/2016). COPD< Suspected to have PTSD.    Sleep relevant medical history: Nocturia 2-4 , Sleep walking;none, Night terrors- PTSD ?, other Parasomnia REM BD.   Family medical /sleep history: mother died with DM, HTN, CAD and ESRD- dementia, father died at 49 of CAD,  MI, nephew on CPAP with OSA.    Social history: Patient is retired from the police and lives in a household with spouse, persons/ alone. Family status is , with one adult 6 y old son,  one grandchild.  The patient  used to work in day shifts. Pets are present. Tobacco use; 1 ppd .  ETOH use: beer 4-6 a day(!),  Caffeine intake in form of Coffee( rare) Soda( rare) Tea ( 1 glas in PM) or energy drinks. Regular exercise in form of walking.   Hobbies :woodworking      Sleep habits are as  follows: The patient's dinner time is between 5-6 PM. The patient goes to bed at 11 PM and watches TV -continues to sleep for 3-4 hours, then wakes for several bathroom breaks, the first time at 2-3 AM.   Active dreams starting earlier and as late as 5 A , several times a nights on more nights (now 5 a week).   The preferred sleep position is prone, with the support of one pillows. Dreams are reportedly frequent/vivid.  =  AM is the usual rise time.  The patient wakes up spontaneously after 7 AM .  He reports not feeling refreshed or restored in AM, with symptoms such as dry mouth, and residual fatigue.  Naps are not taken.  Review of Systems: Out of a complete 14 system review, the patient complains of only the following symptoms, and all other reviewed systems are negative.:  Fatigue, sleepiness , snoring, fragmented sleep, Insomnia - PTSD and sleep hygiene, alcohol, nocturia, REM BD.      How likely are you to doze in the following situations: 0 = not likely, 1 = slight chance, 2 = moderate chance, 3 = high chance   Sitting and Reading? Watching Television? Sitting inactive in a public place (theater or meeting)? As a passenger in a car for an hour without a break? Lying down in the afternoon when circumstances permit? Sitting  and talking to someone? Sitting quietly after lunch without alcohol? In a car, while stopped for a few minutes in traffic?   Total = 0/ 24 points   FSS endorsed at 9/ 63 points.   Social History   Socioeconomic History   Marital status: Married    Spouse name: Not on file   Number of children: Not on file   Years of education: Not on file   Highest education level: Not on file  Occupational History   Not on file  Tobacco Use   Smoking status: Every Day    Packs/day: 1.00    Years: 30.00    Total pack years: 30.00    Types: Cigarettes   Smokeless tobacco: Never   Tobacco comments:    greater than 20 years   Vaping Use   Vaping Use: Never used   Substance and Sexual Activity   Alcohol use: No   Drug use: No   Sexual activity: Never  Other Topics Concern   Not on file  Social History Narrative   Not on file   Social Determinants of Health   Financial Resource Strain: Not on file  Food Insecurity: Not on file  Transportation Needs: Not on file  Physical Activity: Not on file  Stress: Not on file  Social Connections: Not on file    Family History  Problem Relation Age of Onset   Hypertension Father    Heart attack Father    Diabetes Mother    Cancer Mother    Lupus Sister     Past Medical History:  Diagnosis Date   Chest pain    none recently since "i stopped drinking the "big gulp" of caffeine    Cholelithiasis 08/2016   History of bronchitis    History of kidney stones    History of urinary tract infection    Hypercholesteremia    Pneumonia    Prostate cancer ([facility])    Sepsis ([facility]) 08/2016    Past Surgical History:  Procedure Laterality Date   CARDIAC CATHETERIZATION N/A 11/25/2015   Procedure: Left Heart Cath and Coronary Angiography;  Surgeon: Josue Hector, MD;  Location: [facility];  Service: Cardiovascular;  Laterality: N/A;   CYSTOSCOPY W/ URETERAL STENT PLACEMENT Right 08/10/2016   Procedure: CYSTOSCOPY WITH RETROGRADE PYELOGRAM RIGHT URETERAL STENT PLACEMENT;  Surgeon: Irine Seal, MD;  Location: [facility];  Service: Urology;  Laterality: Right;   CYSTOSCOPY/URETEROSCOPY/HOLMIUM LASER/STENT PLACEMENT Right 03/24/2021   Procedure: CYSTOSCOPY/RETROGRADE/URETEROSCOPY/HOLMIUM LASER/STENT PLACEMENT;  Surgeon: Raynelle Bring, MD;  Location: [facility];  Service: Urology;  Laterality: Right;   EXTRACORPOREAL SHOCK WAVE LITHOTRIPSY Right 08/24/2016   Procedure: RIGHT EXTRACORPOREAL SHOCK WAVE LITHOTRIPSY (ESWL);  Surgeon: Kathie Rhodes, MD;  Location: [facility];  Service: Urology;  Laterality: Right;   PELVIC LYMPH NODE DISSECTION Bilateral 07/02/2016   Procedure: BILATERAL PELVIC LYMPH NODE DISSECTION;   Surgeon: Raynelle Bring, MD;  Location: [facility];  Service: Urology;  Laterality: Bilateral;   ROBOT ASSISTED LAPAROSCOPIC RADICAL PROSTATECTOMY N/A 07/02/2016   Procedure: XI ROBOTIC ASSISTED LAPAROSCOPIC RADICAL PROSTATECTOMY LEVEL 2;  Surgeon: Raynelle Bring, MD;  Location: [facility];  Service: Urology;  Laterality: N/A;     Current Outpatient Medications on File Prior to Visit  Medication Sig Dispense Refill   aspirin EC 81 MG tablet Take 1 tablet (81 mg total) by mouth daily. 30 tablet 11   ezetimibe (ZETIA) 10 MG tablet TAKE 1 TABLET BY MOUTH DAILY 90 tablet 3   hydrochlorothiazide (HYDRODIURIL) 25 MG  tablet Take 25 mg by mouth daily.     rosuvastatin (CRESTOR) 40 MG tablet Take 1 tablet (40 mg total) by mouth daily. 90 tablet 3   No current facility-administered medications on file prior to visit.    No Known Allergies  Physical exam:  Today's Vitals   01/05/22 0840  BP: 112/75  Pulse: 70  Weight: 201 lb (91.2 kg)  Height: '6\' 1"'$  (1.854 m)   Body mass index is 26.52 kg/m.   Wt Readings from Last 3 Encounters:  01/05/22 201 lb (91.2 kg)  03/24/21 204 lb 9.6 oz (92.8 kg)  10/23/20 212 lb (96.2 kg)     Ht Readings from Last 3 Encounters:  01/05/22 '6\' 1"'$  (1.854 m)  03/24/21 '6\' 1"'$  (1.854 m)  10/23/20 '6\' 1"'$  (1.854 m)      General: The patient is awake, alert and appears not in acute distress. The patient is well groomed. Head: Normocephalic, atraumatic. Neck is supple. Mallampati 2,  neck circumference:17 inches . Nasal airflow patent.  Retrognathia is not seen.  Dental status: biological, partial upper and lower.  Cardiovascular:  Regular rate and cardiac rhythm by pulse,  without distended neck veins. Respiratory: Lungs are clear to auscultation.  Skin:  Without evidence of ankle edema, or rash. Trunk: The patient's posture is erect.   Neurologic exam : The patient is awake and alert, oriented to place and time.   Memory subjective described as intact.  Attention span &  concentration ability appears normal.  Speech is fluent,  without  dysarthria, dysphonia or aphasia.  Mood and affect are appropriate.   Cranial nerves: no loss of smell or taste reported  Pupils are equal and briskly reactive to light. Funduscopic exam deferred.; early cataract   Extraocular movements in vertical and horizontal planes were intact and without nystagmus. No Diplopia. Visual fields by finger perimetry are intact. Hearing was intact to soft voice and finger rubbing.    Facial sensation intact to fine touch.  Facial motor strength is symmetric and tongue and uvula move midline.  Neck ROM : rotation, tilt and flexion extension were normal for age and shoulder shrug was symmetrical.    Motor exam:  Symmetric bulk, tone and ROM.   Normal tone without cog wheeling, symmetric grip strength .   Sensory:  Fine touch, pinprick and vibration were tested  and  normal.  Proprioception tested in the upper extremities was normal.   Coordination: Rapid alternating movements in the fingers/hands were of normal speed.  The Finger-to-nose maneuver was intact without evidence of ataxia, dysmetria or tremor.   Gait and station: Patient could rise unassisted from a seated position, walked without assistive device.  Stance is of normal width/ base and the patient turned with 3 steps.  Toe and heel walk were deferred.  Deep tendon reflexes: in the  upper and lower extremities are symmetric and intact.  Babinski response was deferred.      After spending a total time of  45  minutes face to face and additional time for physical and neurologic examination, review of laboratory studies, outside paper records.  personal review of imaging studies, reports and results of other testing and review of referral information / records as far as provided in visit, I have established the following assessments:  1) no parkinsonian features, neither tremor, nor rigor, no masked face, and normal voice.  2)  active dream enactment or PTSD ? Sleep hygiene is affecting sleep quality and duration , beer, TV,  3) had been a snorer when he was much heavier.    My Plan is to proceed with:  1) I have the pleasure of meeting Mr. Shashwat a Chiang today in the presence of his wife.  I would like to distinguish if the patient has a lingering PTSD condition versus a REM sleep behavior disorder.  REM sleep behavior disorder can be related to substances and sleep hygiene but can also be a precursor to parkinsonian disorders.  I have found of the hallmarks of Parkinson's disease or Lewy body dementia in this patient today my goal is to have an attended sleep study with a full monitoring capacity of his movements at night.  Often in the case of PTSD Rosa Pinn or Catapres Minipress is used to allow a quicker recovery from the flashback this medication works at night and in daytime.  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  There have been a medication.  It is under the guidance of Dr. Alroy Dust in the past but I have not found I think it was imipramine.  So imipramine can push out REM sleep into the late hours of the morning so that REM episodes would not interrupt early sleep.  However imipramine is much more likely to address cataplexy then PTSD or REM behavior disorder.  Many patients try melatonin at 5 mg or less at night to see if that reduces the dream enactment and I would certainly want Mr. Fullmer to also try this.  If it increases the dream frequency we will change to an SSRI and a serotonin reuptake inhibitor and plan see what the to use clonazepam.  This should be induced at a very low dose.  Reduce alcohol intake to 2 drinks for now at night, each accompanied by a large glass of water, and TV off 1 hour before bedtime.  Lupron induced hot-flushes and diaphoresis at night.    I  would like to thank Collene Leyden, MD and Donavan Burnet, Md [address] [place],  [place] [address] for allowing me to meet with and to take care of this pleasant patient.   In short, Xavier Goodwin is presenting with  dream enactment. Sleep study with male tech, please.   I plan to follow up either personally or through our NP within 2-3  months post sleep study.   CC: I will share my notes with PCP.  Electronically signed by: Larey Seat, MD 01/05/2022 9:08 AM  [facility] Board certified by The [employer] of Sleep Medicine and Diplomate of the [employer] of Sleep Medicine. Board certified In Neurology through the [place], Fellow of the [employer] of Neurology. Medical Director of [facility].

## 2022-01-20 NOTE — Progress Notes (Addendum)
Cardiology Office Note:    Date:  02/03/2022   ID:  Xavier Goodwin, DOB 1960-06-28, MRN [id number]  PCP:  Collene Leyden, [place] Cardiologist:  Jenkins Rouge, MD     Referring MD: Collene Leyden, MD   Chief Complaint:  Follow-up     History of Present Illness:   Xavier Goodwin is a 61 y.o. male with history of chest pain, normal NST 2015, recurrent 2017 EF 48% ? Lat wall ischemia, cath 11/2015 50% ramus, 30% Cfx, calcium score 266 81st percentile age and sex 10/2015   Last saw Dr. Johnsie Cancel 11/2017 and was still smoking. Plan to repeat calcium score 2020 not done.  I saw the patient 10/2020 and having M-S chest pain and still smoking.  Patient comes in for f/u. 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 No chest pain, dyspnea, palpitations, edema. Still smoking 1 ppd. Patient has had trouble with kidney stones and says he have decreased function in his one kidney. NM renal image shows markedly impaired right renal function-7% with normal left. Wondering if he needs HCTZ. Doesn't know why he's on it and has never had HTN.           Past Medical History:  Diagnosis Date   Chest pain    none recently since "i stopped drinking the "big gulp" of caffeine    Cholelithiasis 08/2016   History of bronchitis    History of kidney stones    History of urinary tract infection    Hypercholesteremia    Pneumonia    Prostate cancer ([facility])    Sepsis ([facility]) 08/2016   Current Medications: Current Meds  Medication Sig   ALPRAZolam (XANAX) 0.5 MG tablet Take 1 tablet (0.5 mg total) by mouth at bedtime as needed for anxiety.   aspirin EC 81 MG tablet Take 1 tablet (81 mg total) by mouth daily.   ezetimibe (ZETIA) 10 MG tablet TAKE 1 TABLET BY MOUTH DAILY   melatonin 3 MG TABS tablet Take 1 tablet (3 mg total) by mouth at bedtime.   rosuvastatin (CRESTOR) 40 MG tablet Take 1 tablet (40 mg total) by mouth daily.   [DISCONTINUED] hydrochlorothiazide (HYDRODIURIL) 25 MG  tablet Take 25 mg by mouth daily.    Allergies:   Patient has no known allergies.   Social History   Tobacco Use   Smoking status: Every Day    Packs/day: 1.00    Years: 30.00    Total pack years: 30.00    Types: Cigarettes   Smokeless tobacco: Never   Tobacco comments:    greater than 20 years   Vaping Use   Vaping Use: Never used  Substance Use Topics   Alcohol use: No   Drug use: No    Family Hx: The patient's family history includes Cancer in his mother; Diabetes in his mother; Heart attack in his father; Hypertension in his father; Lupus in his sister.  ROS     Physical Exam:    VS:  BP 112/78   Pulse 64   Ht '6\' 1"'$  (1.854 m)   Wt 207 lb 12.8 oz (94.3 kg)   SpO2 98%   BMI 27.42 kg/m     Wt Readings from Last 3 Encounters:  02/03/22 207 lb 12.8 oz (94.3 kg)  01/05/22 201 lb (91.2 kg)  03/24/21 204 lb 9.6 oz (92.8 kg)    Physical Exam  GEN: Well nourished, well developed, in no acute distress  Neck:  no JVD, carotid bruits, or masses Cardiac:RRR; no murmurs, rubs, or gallops  Respiratory:  clear to auscultation bilaterally, normal work of breathing GI: soft, nontender, nondistended, + BS Ext: without cyanosis, clubbing, or edema, Good distal pulses bilaterally Neuro:  Alert and Oriented x 3, Psych: euthymic mood, full affect        EKGs/Labs/Other Test Reviewed:    EKG:  EKG is   ordered today.  The ekg ordered today demonstrates NSR normal EKG   Recent Labs: 03/24/2021: BUN 13; Creatinine, Ser 0.96; Potassium 3.8; Sodium 141   Recent Lipid Panel Recent Labs    05/02/21 0902  CHOL 149  TRIG 35  HDL 84  LDLCALC 56     Prior CV Studies:    NM renal flow 06/2021 IMPRESSION: Normal LEFT renogram.   Markedly impaired/diminished RIGHT renal function with a mildly dilated collecting system and poor uptake/clearance of tracer over the course of the study.   Markedly asymmetric renal function, 93% LEFT versus 7% RIGHT.     Electronically  Signed   By: Lavonia Dana M.D.   On: 06/18/2021 14:11 Coronary calcium score 10/16/15 FINDINGS: Non-cardiac: No significant non cardiac findings on limited lung and soft tissue windows. See separate report from [facility]. Ascending Aorta:  3.6 cm Pericardium: Normal Coronary arteries: Calcium noted in distal LM, LAD RCA and distal circumflex IMPRESSION: Coronary calcium score of 266. This was 81st percentile for age and sex matched control. Jenkins Rouge Electronically Signed   By: Jenkins Rouge M.D.   On: 10/21/2015 10:32   Cardiac cath 11/25/15 Ramus lesion, 50 %stenosed. Prox Cx lesion, 30 %stenosed.   Myovue abnormal in basal lateral wall no lesion to correspond 50% ostial small IM and 30% calcified proximal LAD Medical Rx. Should not have any flow limiting lesions to cause angina   Risk Assessment/Calculations/Metrics:              ASSESSMENT & PLAN:   No problem-specific Assessment & Plan notes found for this encounter.   CAD with 50% ramus, 30% prox Cfx cath 2018-no angina, continue ASA, crestor and zetia-check labs today. 150 min exercise weekly.   HLD LDL 56 in 04/2021 on Crestor 40 mg once daily, and zetia repeat FLP today   Tobacco abuse 1 ppd trying to quit. Smoking cessation discussed.  Kidney impairment-right kidney only funcitoning 7%-he's not sure why he's on HCTZ so would like to stop(no history of HTN). His wife is a Marine scientist and called back in to tell us he's on it per urologist. Will not stop med at this time.              Dispo:  No follow-ups on file.   Medication Adjustments/Labs and Tests Ordered: Current medicines are reviewed at length with the patient today.  Concerns regarding medicines are outlined above.  Tests Ordered: Orders Placed This Encounter  Procedures   Comprehensive metabolic panel   Lipid panel   TSH   CBC   EKG 12-Lead   Medication Changes: No orders of the defined types were placed in this  encounter.  Signed, Ermalinda Barrios, PA-C  02/03/2022 10:05 AM    [address], [place], [place]  [id number] Phone: [phone]; Fax: [phone]

## 2022-01-28 ENCOUNTER — Telehealth: Payer: Self-pay

## 2022-01-28 DIAGNOSIS — G4752 REM sleep behavior disorder: Secondary | ICD-10-CM

## 2022-01-28 NOTE — Addendum Note (Signed)
Addended by: Darleen Crocker on: 01/28/2022 11:19 AM   Modules accepted: Orders

## 2022-01-28 NOTE — Telephone Encounter (Signed)
Order placed for the HST

## 2022-01-28 NOTE — Telephone Encounter (Signed)
[employer] is not allowing for inlab sleep study at this time. They are requesting pt to do a HST first. If HST is negative, then we can try for auth for inlab.  Would you like to order HST?

## 2022-02-03 ENCOUNTER — Encounter: Payer: Self-pay | Admitting: Physician Assistant

## 2022-02-03 ENCOUNTER — Ambulatory Visit: Payer: 59 | Attending: Physician Assistant | Admitting: Physician Assistant

## 2022-02-03 ENCOUNTER — Telehealth: Payer: Self-pay | Admitting: Cardiovascular Disease

## 2022-02-03 VITALS — BP 112/78 | HR 64 | Ht 73.0 in | Wt 207.8 lb

## 2022-02-03 DIAGNOSIS — N200 Calculus of kidney: Secondary | ICD-10-CM | POA: Diagnosis not present

## 2022-02-03 DIAGNOSIS — I2510 Atherosclerotic heart disease of native coronary artery without angina pectoris: Secondary | ICD-10-CM | POA: Diagnosis not present

## 2022-02-03 DIAGNOSIS — Z720 Tobacco use: Secondary | ICD-10-CM

## 2022-02-03 DIAGNOSIS — E785 Hyperlipidemia, unspecified: Secondary | ICD-10-CM

## 2022-02-03 LAB — COMPREHENSIVE METABOLIC PANEL
ALT: 20 IU/L (ref 0–44)
AST: 23 IU/L (ref 0–40)
Albumin/Globulin Ratio: 1.7 (ref 1.2–2.2)
Albumin: 4.2 g/dL (ref 3.9–4.9)
Alkaline Phosphatase: 61 IU/L (ref 44–121)
BUN/Creatinine Ratio: 8 — ABNORMAL LOW (ref 10–24)
BUN: 9 mg/dL (ref 8–27)
Bilirubin Total: 0.8 mg/dL (ref 0.0–1.2)
CO2: 26 mmol/L (ref 20–29)
Calcium: 8.9 mg/dL (ref 8.6–10.2)
Chloride: 107 mmol/L — ABNORMAL HIGH (ref 96–106)
Creatinine, Ser: 1.13 mg/dL (ref 0.76–1.27)
Globulin, Total: 2.5 g/dL (ref 1.5–4.5)
Glucose: 86 mg/dL (ref 70–99)
Potassium: 4 mmol/L (ref 3.5–5.2)
Sodium: 142 mmol/L (ref 134–144)
Total Protein: 6.7 g/dL (ref 6.0–8.5)
eGFR: 73 mL/min/{1.73_m2} (ref >59)

## 2022-02-03 LAB — CBC
Hematocrit: 38.4 % (ref 37.5–51.0)
Hemoglobin: 12.6 g/dL — ABNORMAL LOW (ref 13.0–17.7)
MCH: 35.1 pg — ABNORMAL HIGH (ref 26.6–33.0)
MCHC: 32.8 g/dL (ref 31.5–35.7)
MCV: 107 fL — ABNORMAL HIGH (ref 79–97)
Platelets: 203 10*3/uL (ref 150–450)
RBC: 3.59 x10E6/uL — ABNORMAL LOW (ref 4.14–5.80)
RDW: 12.5 % (ref 11.6–15.4)
WBC: 6.3 10*3/uL (ref 3.4–10.8)

## 2022-02-03 LAB — LIPID PANEL
Chol/HDL Ratio: 2.1 ratio (ref 0.0–5.0)
Cholesterol, Total: 140 mg/dL (ref 100–199)
HDL: 67 mg/dL (ref >39)
LDL Chol Calc (NIH): 63 mg/dL (ref 0–99)
Triglycerides: 43 mg/dL (ref 0–149)
VLDL Cholesterol Cal: 10 mg/dL (ref 5–40)

## 2022-02-03 LAB — TSH: TSH: 1.67 u[IU]/mL (ref 0.450–4.500)

## 2022-02-03 MED ORDER — HYDROCHLOROTHIAZIDE 25 MG PO TABS: 25.0000 mg | ORAL_TABLET | Freq: Every day | ORAL | 3 refills | Status: AC

## 2022-02-03 NOTE — Telephone Encounter (Signed)
HST- [employer] no auth req.  Patient is scheduled at [facility] for 02/24/22 at 10 AM.  Mailed packet to the patient.

## 2022-02-03 NOTE — Telephone Encounter (Signed)
After speaking with Ermalinda Barrios, PA - I called pt's Wife and advised her that it's ok for the pt to take the HCTZ daily.  I have updated pt's med list..Marland Kitchen

## 2022-02-03 NOTE — Telephone Encounter (Signed)
Pt c/o medication issue:  1. Name of Medication:  hydroCHLOROthiazide 25 mg   2. How are you currently taking this medication (dosage and times per day)?   3. Are you having a reaction (difficulty breathing--STAT)?   4. What is your medication issue?   Patient's wife states during appointment today with Ermalinda Barrios the patient stated he didn't know why he has been taking HCTZ and it was discontinued. Patient's wife states since the appointment she found out that the patient's urologist had him on this medication to help prevent kidney stones because he already has 1 kidney that is barely functioning.

## 2022-02-03 NOTE — Patient Instructions (Signed)
Medication Instructions:  Your physician has recommended you make the following change in your medication:   DISCONTINUE: Hydrochlorothiazide  *If you need a refill on your cardiac medications before your next appointment, please call your pharmacy*   Lab Work: TODAY: CMET, CBC, TSH, FLP  If you have labs (blood work) drawn today and your tests are completely normal, you will receive your results only by: [place] (if you have MyChart) OR A paper copy in the mail If you have any lab test that is abnormal or we need to change your treatment, we will call you to review the results.   Follow-Up: At [facility], you and your health needs are our priority.  As part of our continuing mission to provide you with exceptional heart care, we have created designated Provider Care Teams.  These Care Teams include your primary Cardiologist (physician) and Advanced Practice Providers (APPs -  Physician Assistants and Nurse Practitioners) who all work together to provide you with the care you need, when you need it.  We recommend signing up for the patient portal called "MyChart".  Sign up information is provided on this After Visit Summary.  MyChart is used to connect with patients for Virtual Visits (Telemedicine).  Patients are able to view lab/test results, encounter notes, upcoming appointments, etc.  Non-urgent messages can be sent to your provider as well.   To learn more about what you can do with MyChart, go to [email or web address].    Your next appointment:   1 year(s)  The format for your next appointment:   In Person  Provider:   Jenkins Rouge, MD     Other Instructions Keep a check on your blood pressure. If it consistently stays above 135/85 please let us know.  Your provider recommends that you maintain 150 minutes per week of moderate aerobic activity.  Managing the Challenge of Quitting Smoking Quitting smoking is a physical and mental challenge. You may  have cravings, withdrawal symptoms, and temptation to smoke. Before quitting, work with your health care provider to make a plan that can help you manage quitting. Making a plan before you quit may keep you from smoking when you have the urge to smoke while trying to quit. How to manage lifestyle changes Managing stress Stress can make you want to smoke, and wanting to smoke may cause stress. It is important to find ways to manage your stress. You could try some of the following: Practice relaxation techniques. Breathe slowly and deeply, in through your nose and out through your mouth. Listen to music. Soak in a bath or take a shower. Imagine a peaceful place or vacation. Get some support. Talk with family or friends about your stress. Join a support group. Talk with a counselor or therapist. Get some physical activity. Go for a walk, run, or bike ride. Play a favorite sport. Practice yoga.  Medicines Talk with your health care provider about medicines that might help you deal with cravings and make quitting easier for you. Relationships Social situations can be difficult when you are quitting smoking. To manage this, you can: Avoid parties and other social situations where people might be smoking. Avoid alcohol. Leave right away if you have the urge to smoke. Explain to your family and friends that you are quitting smoking. Ask for support and let them know you might be a bit grumpy. Plan activities where smoking is not an option. General instructions Be aware that many people gain weight after they quit smoking.  However, not everyone does. To keep from gaining weight, have a plan in place before you quit, and stick to the plan after you quit. Your plan should include: Eating healthy snacks. When you have a craving, it may help to: Eat popcorn, or try carrots, celery, or other cut vegetables. Chew sugar-free gum. Changing how you eat. Eat small portion sizes at meals. Eat 4-6 small  meals throughout the day instead of 1-2 large meals a day. Be mindful when you eat. You should avoid watching television or doing other things that might distract you as you eat. Exercising regularly. Make time to exercise each day. If you do not have time for a long workout, do short bouts of exercise for 5-10 minutes several times a day. Do some form of strengthening exercise, such as weight lifting. Do some exercise that gets your heart beating and causes you to breathe deeply, such as walking fast, running, swimming, or biking. This is very important. Drinking plenty of water or other low-calorie or no-calorie drinks. Drink enough fluid to keep your urine pale yellow.  How to recognize withdrawal symptoms Your body and mind may experience discomfort as you try to get used to not having nicotine in your system. These effects are called withdrawal symptoms. They may include: Feeling hungrier than normal. Having trouble concentrating. Feeling irritable or restless. Having trouble sleeping. Feeling depressed. Craving a cigarette. These symptoms may surprise you, but they are normal to have when quitting smoking. To manage withdrawal symptoms: Avoid places, people, and activities that trigger your cravings. Remember why you want to quit. Get plenty of sleep. Avoid coffee and other drinks that contain caffeine. These may worsen some of your symptoms. How to manage cravings Come up with a plan for how to deal with your cravings. The plan should include the following: A definition of the specific situation you want to deal with. An activity or action you will take to replace smoking. A clear idea for how this action will help. The name of someone who could help you with this. Cravings usually last for 5-10 minutes. Consider taking the following actions to help you with your plan to deal with cravings: Keep your mouth busy. Chew sugar-free gum. Suck on hard candies or a straw. Brush your  teeth. Keep your hands and body busy. Change to a different activity right away. Squeeze or play with a ball. Do an activity or a hobby, such as making bead jewelry, practicing needlepoint, or working with wood. Mix up your normal routine. Take a short exercise break. Go for a quick walk, or run up and down stairs. Focus on doing something kind or helpful for someone else. Call a friend or family member to talk during a craving. Join a support group. Contact a quitline. Where to find support To get help or find a support group: Call the [facility]'s Smoking Quitline: 1-800-QUIT-NOW 548-256-1632) Text QUIT to SmokefreeTXT: 035009 Where to find more information Visit these websites to find more information on quitting smoking: U.S. Department of Health and Human Services: www.smokefree.gov American Lung Association: www.freedomfromsmoking.org Centers for Disease Control and Prevention (CDC): http://www.wolf.info/ American Heart Association: www.heart.org Contact a health care provider if: You want to change your plan for quitting. The medicines you are taking are not helping. Your eating feels out of control or you cannot sleep. You feel depressed or become very anxious. Summary Quitting smoking is a physical and mental challenge. You will face cravings, withdrawal symptoms, and temptation to smoke  again. Preparation can help you as you go through these challenges. Try different techniques to manage stress, handle social situations, and prevent weight gain. You can deal with cravings by keeping your mouth busy (such as by chewing gum), keeping your hands and body busy, calling family or friends, or contacting a quitline for people who want to quit smoking. You can deal with withdrawal symptoms by avoiding places where people smoke, getting plenty of rest, and avoiding drinks that contain caffeine. This information is not intended to replace advice given to you by your health care  provider. Make sure you discuss any questions you have with your health care provider. Document Revised: 03/14/2021 Document Reviewed: 03/14/2021 Elsevier Patient Education  [employer]

## 2022-02-24 ENCOUNTER — Ambulatory Visit: Payer: 59 | Admitting: Neurology

## 2022-02-24 DIAGNOSIS — A419 Sepsis, unspecified organism: Secondary | ICD-10-CM

## 2022-02-24 DIAGNOSIS — Z72821 Inadequate sleep hygiene: Secondary | ICD-10-CM

## 2022-02-24 DIAGNOSIS — G4733 Obstructive sleep apnea (adult) (pediatric): Secondary | ICD-10-CM

## 2022-02-24 DIAGNOSIS — G4752 REM sleep behavior disorder: Secondary | ICD-10-CM

## 2022-02-24 DIAGNOSIS — Z8659 Personal history of other mental and behavioral disorders: Secondary | ICD-10-CM

## 2022-02-24 DIAGNOSIS — F17200 Nicotine dependence, unspecified, uncomplicated: Secondary | ICD-10-CM

## 2022-03-02 NOTE — Progress Notes (Signed)
Calculated pAHI (per hour): 12.1/h IMPRESSION:  This HST confirms the presence of mild obstructive sleep apnea slightly exacerbated during REM sleep and very much dependent on supine sleep position.  RECOMMENDATION: Recommendation #1 is to avoid supine sleep.  Recommendation #2 is to treat this mild apnea with positive airway pressure if tolerated.  I would appreciate if the patient could give it a try, and I will order a autotitration device with a setting between 6 and 16 cmH2O pressure, 3 cm EPR, heated humidification and interface of patient's choice and comfort.  Please note that a home sleep test cannot give data to verify the presence of REM sleep behavior disorder, posttraumatic stress disorder or parasomnia.

## 2022-03-02 NOTE — Progress Notes (Signed)
[facility] TEST REPORT ( by Watch PAT)   STUDY DATA were loaded today, 03-02-2022:   DOB:  February 23, 1961 MRN:    ORDERING CLINICIAN: Larey Seat, MD   REFERRING CLINICIAN: [name], MD    CLINICAL INFORMATION/HISTORY: Xavier Goodwin is a 61 y.o.  African -American male patient who was seen on 01-05-2022  upon referral by PCP Dr Alroy Dust.  Chief concern according to patient :  " yelling and cursing, aggressive fighting, enacting dreams, not sleep walking, but fallen out of bed", " 2 years of progressive nightmares- since Lupron was started for prostate cancer. 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 Wife noted him to be easily agitated, and defensive. He is hypervigilant.  [name]  has a past medical history of Chest pain, Cholelithiasis (08/2016), History of bronchitis, History of kidney stones, History of urinary tract infection, Hypercholesteremia, Pneumonia, Prostate cancer ([place]), and Sepsis ([place]) (08/2016). COPD< Suspected to have PTSD.    Sleep relevant medical history: Nocturia 2-4 , Sleep walking;none, Night terrors- PTSD ?, other Parasomnia REM BD.   Family medical /sleep history: mother died with DM, HTN, CAD and ESRD- dementia, father died at 56 of CAD,  MI, a nephew with OSA.      Epworth sleepiness score:  0/24. FSS at 9/ 63 points. Dream enactment.    BMI: 26.6 kg/m   Neck Circumference: 17"   FINDINGS:   Sleep Summary:   Total Recording Time (hours, min):   9 hours 7 minutes  Total Sleep Time (hours, min):   7 hours 55 minutes              Percent REM (%): 23%                                      Respiratory Indices:   Calculated pAHI (per hour):    12.1/h                         REM pAHI: 15.4/h                                               NREM pAHI:        11/h                      Positional AHI: The patient slept 300 minutes in supine position associated with an AHI of 16.2/h  and RDI of 18.5/h.  This was followed by sleeping on the right and left side with about 85 minutes each.  The lowest AHI was seen in the right lateral position at 2.4/h, followed by left-sided AHI 6.6/h.  Snoring reached a mean volume of 41 dB and was present for 15% of total sleep time                                                  Oxygen Saturation Statistics:   O2 Saturation Range (%): Between a nadir of 82 and a maximum of 98% with a mean saturation  at 94%                                      O2 Saturation (minutes) <89%:    0 minutes       Pulse Rate Statistics:   Pulse Mean (bpm): 75 bpm              Pulse Range: Between 57 and 98 bpm               IMPRESSION:  This HST confirms the presence of mild obstructive sleep apnea slightly exacerbated during REM sleep and very much dependent on supine sleep position.   RECOMMENDATION: Recommendation #1 is to avoid supine sleep.  Recommendation #2 is to treat this mild apnea with positive airway pressure if tolerated.  I would appreciate if the patient could give it a try, and I will order a autotitration device with a setting between 6 and 16 cmH2O pressure, 3 cm EPR, heated humidification and interface of patient's choice and comfort.  Please note that a home sleep test cannot give data to verify the presence of REM sleep behavior disorder, posttraumatic stress disorder or parasomnia.    INTERPRETING PHYSICIAN:   Larey Seat, MD   Medical Director of [facility] at [facility].

## 2022-03-02 NOTE — Addendum Note (Signed)
Addended by: Larey Seat on: 03/02/2022 03:41 PM   Modules accepted: Orders

## 2022-03-02 NOTE — Procedures (Signed)
[facility] TEST REPORT ( by Watch PAT)   STUDY DATA were loaded today, 03-02-2022:   DOB:  Dec 13, 1960 MRN:    ORDERING CLINICIAN: Larey Seat, MD   REFERRING CLINICIAN: [name], MD    CLINICAL INFORMATION/HISTORY: Xavier Goodwin is a 61 y.o.  African -American male patient who was seen on 01-05-2022  upon referral by PCP Dr Alroy Dust.  Chief concern according to patient :  " yelling and cursing, aggressive fighting, enacting dreams, not sleep walking, but fallen out of bed", " 2 years of progressive nightmares- since Lupron was started for prostate cancer. 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 Wife noted him to be easily agitated, and defensive. He is hypervigilant.  Xavier Goodwin  has a past medical history of Chest pain, Cholelithiasis (08/2016), History of bronchitis, History of kidney stones, History of urinary tract infection, Hypercholesteremia, Pneumonia, Prostate cancer ([place]), and Sepsis ([place]) (08/2016). COPD< Suspected to have PTSD.    Sleep relevant medical history: Nocturia 2-4 , Sleep walking;none, Night terrors- PTSD ?, other Parasomnia REM BD.   Family medical /sleep history: mother died with DM, HTN, CAD and ESRD- dementia, father died at 33 of CAD,  MI, a nephew with OSA.      Epworth sleepiness score:  0/24. FSS at 9/ 63 points. Dream enactment.    BMI: 26.6 kg/m   Neck Circumference: 17"   FINDINGS:   Sleep Summary:   Total Recording Time (hours, min):   9 hours 7 minutes  Total Sleep Time (hours, min):   7 hours 55 minutes              Percent REM (%): 23%                                      Respiratory Indices:   Calculated pAHI (per hour):    12.1/h                         REM pAHI: 15.4/h                                               NREM pAHI:        11/h                      Positional AHI: The patient slept 300 minutes in supine position associated with an AHI of 16.2/h and RDI  of 18.5/h.  This was followed by sleeping on the right and left side with about 85 minutes each.  The lowest AHI was seen in the right lateral position at 2.4/h, followed by left-sided AHI 6.6/h.  Snoring reached a mean volume of 41 dB and was present for 15% of total sleep time                                                  Oxygen Saturation Statistics:   O2 Saturation Range (%): Between a nadir of 82 and a maximum of 98% with a mean saturation at 94%  O2 Saturation (minutes) <89%:    0 minutes       Pulse Rate Statistics:   Pulse Mean (bpm): 75 bpm              Pulse Range: Between 57 and 98 bpm               IMPRESSION:  This HST confirms the presence of mild obstructive sleep apnea slightly exacerbated during REM sleep and very much dependent on supine sleep position.   RECOMMENDATION: Recommendation #1 is to avoid supine sleep.  Recommendation #2 is to treat this mild apnea with positive airway pressure if tolerated.  I would appreciate if the patient could give it a try, and I will order a autotitration device with a setting between 6 and 16 cmH2O pressure, 3 cm EPR, heated humidification and interface of patient's choice and comfort.  Please note that a home sleep test cannot give data to verify the presence of REM sleep behavior disorder, posttraumatic stress disorder or parasomnia.    INTERPRETING PHYSICIAN:   Larey Seat, MD   Medical Director of [facility] at [facility].

## 2022-03-03 ENCOUNTER — Telehealth: Payer: Self-pay

## 2022-03-03 NOTE — Telephone Encounter (Signed)
I called patient.  I spoke with patient's wife, Xavier Goodwin, per [place].  I advised patient's wife that patient's HST confirmed the presence of mild OSA.  I advised patient's wife that Dr. Brett Fairy recommends avoiding supine sleep.  Her second recommendation is to try a CPAP.  Patient's wife is agreeable to the patient trying a CPAP.  I will send the order to [facility].  [facility] will reach out to patient within the next 2 weeks to discuss insurance coverage.  Patient will use AutoPap at least 4 hours each night.  Patient will follow-up with Dr. Brett Fairy in February and this appointment was scheduled.  Patient's wife verbalized understanding of results and had no further questions or concerns at this time

## 2022-03-03 NOTE — Telephone Encounter (Signed)
-----   Message from Larey Seat, MD sent at 03/02/2022  3:40 PM EST ----- Calculated pAHI (per hour): 12.1/h IMPRESSION:  This HST confirms the presence of mild obstructive sleep apnea slightly exacerbated during REM sleep and very much dependent on supine sleep position.   RECOMMENDATION: Recommendation #1 is to avoid supine sleep.  Recommendation #2 is to treat this mild apnea with positive airway pressure if tolerated.  I would appreciate if the patient could give it a try, and I will order a autotitration device with a setting between 6 and 16 cmH2O pressure, 3 cm EPR, heated humidification and interface of patient's choice and comfort.   Please note that a home sleep test cannot give data to verify the presence of REM sleep behavior disorder, posttraumatic stress disorder or parasomnia.

## 2022-03-23 ENCOUNTER — Telehealth: Payer: Self-pay | Admitting: Neurology

## 2022-03-23 NOTE — Telephone Encounter (Signed)
Insurance company said physician would have to place a order for any replacement parts if any parts are periodically replacement. Pt got CPAP  on 03/19/22. DME company said has 30 days to try out the mask if want to replace. Just wanted to make you aware. If have any questions can call me back.

## 2022-03-23 NOTE — Telephone Encounter (Signed)
We are aware. Pt set up with CPAP through [employer]. We sent them orders on 03/03/22. Pt has initial cpap f/u 06/01/22.

## 2022-04-16 ENCOUNTER — Other Ambulatory Visit: Payer: Self-pay | Admitting: Physician Assistant

## 2022-05-18 NOTE — Patient Instructions (Incomplete)
Please continue using your CPAP regularly. While your insurance requires that you use CPAP at least 4 hours each night on 70% of the nights, I recommend, that you not skip any nights and use it throughout the night if you can. Getting used to CPAP and staying with the treatment long term does take time and patience and discipline. Untreated obstructive sleep apnea when it is moderate to severe can have an adverse impact on cardiovascular health and raise her risk for heart disease, arrhythmias, hypertension, congestive heart failure, stroke and diabetes. Untreated obstructive sleep apnea causes sleep disruption, nonrestorative sleep, and sleep deprivation. This can have an impact on your day to day functioning and cause daytime sleepiness and impairment of cognitive function, memory loss, mood disturbance, and problems focussing. Using CPAP regularly can improve these symptoms.  We will continue to monitor. Continue melatonin if you fel it is helping.   Follow up in 6 months

## 2022-05-18 NOTE — Progress Notes (Unsigned)
PATIENT: Janet Szwed Pecor DOB: 1960/10/07  REASON FOR VISIT: follow up HISTORY FROM: patient  No chief complaint on file.    HISTORY OF PRESENT ILLNESS:  05/18/22 ALL:  HAVOC GOULETTE is a 62 y.o. male here today for follow up for OSA on CPAP.  He was seen in consult with Dr Brett Fairy for REM disorder. HST 02/2022 showed mild OSA with total AHI 12.1/h. AutoPAP ordered. Since,   He has continues alprazolam 0.59m QHS PRN and melatonin 32mQHS.    HISTORY: (copied from Dr Dohmeier's previous note)  JuNIKOLAS BULOWs a [age].o.  African American male patient who is on 01-05-2022 seen upon  referral by PCP Dr MiAlroy Dust10/05/2021  for a Sleep consultation.  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 Wife noted him to be easily agitated, and defensive. He is hypervigilant. JuMaysin Moehringulmore  has a past medical history of Chest pain, Cholelithiasis (08/2016), History of bronchitis, History of kidney stones, History of urinary tract infection, Hypercholesteremia, Pneumonia, [place] and [place]). COPD< Suspected to have PTSD.    Sleep relevant medical history: Nocturia 2-4 , Sleep walking;none, Night terrors- PTSD ?, other Parasomnia REM BD.   Family medical /sleep history: mother died with DM, HTN, CAD and ESRD- dementia, father died at 6154f CAD,  MI, nephew on CPAP with OSA.    Social history: Patient is retired from the police and lives in a household with spouse, persons/ alone. Family status is , with one adult 3144 old son,  one grandchild.  The patient  used to work in day shifts. Pets are present. Tobacco use; 1 ppd .  ETOH use: beer 4-6 a day(!),  Caffeine intake in form of Coffee( rare) Soda( rare) Tea ( 1 glas in PM) or  energy drinks. Regular exercise in form of walking.   Hobbies :woodworking    Sleep habits are as follows: The patient's dinner time is between 5-6 PM. The patient goes to bed at 11 PM and watches TV -continues to sleep for 3-4 hours, then wakes for several bathroom breaks, the first time at 2-3 AM.   Active dreams starting earlier and as late as 5 A , several times a nights on more nights (now 5 a week).    The preferred sleep position is prone, with the support of one pillows. Dreams are reportedly frequent/vivid.  =  AM is the usual rise time.  The patient wakes up spontaneously after 7 AM .  He reports not feeling refreshed or restored in AM, with symptoms such as dry mouth, and residual fatigue.  Naps are not taken.    REVIEW OF SYSTEMS: Out of a complete 14 system review of symptoms, the patient complains only of the following symptoms, and all other reviewed systems are negative.  ESS: previously 0/24  ALLERGIES: No Known Allergies  HOME MEDICATIONS: Outpatient Medications Prior to Visit  Medication Sig Dispense Refill   ALPRAZolam (XANAX) 0.5 MG tablet Take 1 tablet (0.5 mg total) by mouth at bedtime as needed for anxiety. 2 tablet 0   aspirin EC 81 MG tablet Take 1 tablet (81 mg total) by mouth daily. 30 tablet 11   ezetimibe (ZETIA) 10 MG tablet TAKE 1 TABLET BY  MOUTH DAILY 90 tablet 3   hydrochlorothiazide (HYDRODIURIL) 25 MG tablet Take 1 tablet (25 mg total) by mouth daily. 90 tablet 3   melatonin 3 MG TABS tablet Take 1 tablet (3 mg total) by mouth at bedtime. 30 tablet 0   rosuvastatin (CRESTOR) 40 MG tablet TAKE 1 TABLET BY MOUTH  DAILY 90 tablet 3   No facility-administered medications prior to visit.    PAST MEDICAL HISTORY: Past Medical History:  Diagnosis Date   Chest pain    none recently since "i stopped drinking the "big gulp" of caffeine    Cholelithiasis 08/2016   History of bronchitis    History of kidney stones    History of urinary tract infection     Hypercholesteremia    Pneumonia    Prostate cancer ([place])    Sepsis ([place]) 08/2016    PAST SURGICAL HISTORY: Past Surgical History:  Procedure Laterality Date   CARDIAC CATHETERIZATION N/A 11/25/2015   Procedure: Left Heart Cath and Coronary Angiography;  Surgeon: Josue Hector, MD;  Location: [facility];  Service: Cardiovascular;  Laterality: N/A;   CYSTOSCOPY W/ URETERAL STENT PLACEMENT Right 08/10/2016   Procedure: CYSTOSCOPY WITH RETROGRADE PYELOGRAM RIGHT URETERAL STENT PLACEMENT;  Surgeon: Irine Seal, MD;  Location: [facility];  Service: Urology;  Laterality: Right;   CYSTOSCOPY/URETEROSCOPY/HOLMIUM LASER/STENT PLACEMENT Right 03/24/2021   Procedure: CYSTOSCOPY/RETROGRADE/URETEROSCOPY/HOLMIUM LASER/STENT PLACEMENT;  Surgeon: Raynelle Bring, MD;  Location: [facility];  Service: Urology;  Laterality: Right;   EXTRACORPOREAL SHOCK WAVE LITHOTRIPSY Right 08/24/2016   Procedure: RIGHT EXTRACORPOREAL SHOCK WAVE LITHOTRIPSY (ESWL);  Surgeon: Kathie Rhodes, MD;  Location: [facility];  Service: Urology;  Laterality: Right;   PELVIC LYMPH NODE DISSECTION Bilateral 07/02/2016   Procedure: BILATERAL PELVIC LYMPH NODE DISSECTION;  Surgeon: Raynelle Bring, MD;  Location: [facility];  Service: Urology;  Laterality: Bilateral;   ROBOT ASSISTED LAPAROSCOPIC RADICAL PROSTATECTOMY N/A 07/02/2016   Procedure: XI ROBOTIC ASSISTED LAPAROSCOPIC RADICAL PROSTATECTOMY LEVEL 2;  Surgeon: Raynelle Bring, MD;  Location: [facility];  Service: Urology;  Laterality: N/A;    FAMILY HISTORY: Family History  Problem Relation Age of Onset   Hypertension Father    Heart attack Father    Diabetes Mother    Cancer Mother    Lupus Sister     SOCIAL HISTORY: Social History   Socioeconomic History   Marital status: Married    Spouse name: Not on file   Number of children: Not on file   Years of education: Not on file   Highest education level: Not on file  Occupational History   Not on file  Tobacco Use   Smoking status:  Every Day    Packs/day: 1.00    Years: 30.00    Total pack years: 30.00    Types: Cigarettes   Smokeless tobacco: Never   Tobacco comments:    greater than 20 years   Vaping Use   Vaping Use: Never used  Substance and Sexual Activity   Alcohol use: No   Drug use: No   Sexual activity: Never  Other Topics Concern   Not on file  Social History Narrative   Not on file   Social Determinants of Health   Financial Resource Strain: Not on file  Food Insecurity: Not on file  Transportation Needs: Not on file  Physical Activity: Not on file  Stress: Not on file  Social Connections: Not on file  Intimate Partner Violence: Not on file     PHYSICAL EXAM  There were no vitals filed for this visit. There is no height or weight on file to calculate BMI.  Generalized: Well developed, in no acute distress  Cardiology: normal rate and rhythm, no murmur noted Respiratory: clear to auscultation bilaterally  Neurological examination  Mentation: Alert oriented to time, place, history taking. Follows all commands speech and language fluent Cranial nerve II-XII: Pupils were equal round reactive to light. Extraocular movements were full, visual field were full  Motor: The motor testing reveals 5 over 5 strength of all 4 extremities. Good symmetric motor tone is noted throughout.  Gait and station: Gait is normal.    DIAGNOSTIC DATA (LABS, IMAGING, TESTING) - I reviewed patient records, labs, notes, testing and imaging myself where available.      No data to display           Lab Results  Component Value Date   WBC 6.3 02/03/2022   HGB 12.6 (L) 02/03/2022   HCT 38.4 02/03/2022   MCV 107 (H) 02/03/2022   PLT 203 02/03/2022      Component Value Date/Time   NA 142 02/03/2022 1001   K 4.0 02/03/2022 1001   CL 107 (H) 02/03/2022 1001   CO2 26 02/03/2022 1001   GLUCOSE 86 02/03/2022 1001   GLUCOSE 102 (H) 03/24/2021 1338   BUN 9 02/03/2022 1001   CREATININE 1.13 02/03/2022  1001   CREATININE 0.89 11/20/2015 1058   CALCIUM 8.9 02/03/2022 1001   PROT 6.7 02/03/2022 1001   ALBUMIN 4.2 02/03/2022 1001   AST 23 02/03/2022 1001   ALT 20 02/03/2022 1001   ALKPHOS 61 02/03/2022 1001   BILITOT 0.8 02/03/2022 1001   GFRNONAA >60 03/24/2021 1338   GFRAA >60 08/12/2016 0555   Lab Results  Component Value Date   CHOL 140 02/03/2022   HDL 67 02/03/2022   LDLCALC 63 02/03/2022   TRIG 43 02/03/2022   CHOLHDL 2.1 02/03/2022   No results found for: "HGBA1C" No results found for: "VITAMINB12" Lab Results  Component Value Date   TSH 1.670 02/03/2022     ASSESSMENT AND PLAN 63 y.o. year old male  has a past medical history of Chest pain, Cholelithiasis (08/2016), History of bronchitis, History of kidney stones, History of urinary tract infection, Hypercholesteremia, Pneumonia, Prostate cancer ([place]), and Sepsis ([place]) (08/2016). here with   No diagnosis found.    Rockie Hansley Menzie is doing well on CPAP therapy. Compliance report reveals ***. *** was encouraged to continue using CPAP nightly and for greater than 4 hours each night. We will update supply orders as indicated. Risks of untreated sleep apnea review and education materials provided. Healthy lifestyle habits encouraged. *** will follow up in ***, sooner if needed. *** verbalizes understanding and agreement with this plan.    No orders of the defined types were placed in this encounter.    No orders of the defined types were placed in this encounter.     Debbora Presto, FNP-C 05/18/2022, 4:47 PM [facility] [address] [place], [place] [address] [phone]

## 2022-05-19 ENCOUNTER — Encounter: Payer: Self-pay | Admitting: Family Medicine

## 2022-05-19 ENCOUNTER — Ambulatory Visit: Payer: 59 | Admitting: Family Medicine

## 2022-05-19 VITALS — BP 103/65 | HR 76 | Ht 73.0 in | Wt 204.4 lb

## 2022-05-19 DIAGNOSIS — G4733 Obstructive sleep apnea (adult) (pediatric): Secondary | ICD-10-CM | POA: Diagnosis not present

## 2022-05-19 DIAGNOSIS — G4752 REM sleep behavior disorder: Secondary | ICD-10-CM | POA: Diagnosis not present

## 2022-06-01 ENCOUNTER — Ambulatory Visit: Payer: 59 | Admitting: Neurology

## 2022-09-08 ENCOUNTER — Other Ambulatory Visit: Payer: Self-pay | Admitting: *Deleted

## 2022-09-08 DIAGNOSIS — Z87891 Personal history of nicotine dependence: Secondary | ICD-10-CM

## 2022-09-08 DIAGNOSIS — F17210 Nicotine dependence, cigarettes, uncomplicated: Secondary | ICD-10-CM

## 2022-09-08 DIAGNOSIS — Z122 Encounter for screening for malignant neoplasm of respiratory organs: Secondary | ICD-10-CM

## 2022-10-21 ENCOUNTER — Ambulatory Visit (INDEPENDENT_AMBULATORY_CARE_PROVIDER_SITE_OTHER): Payer: 59 | Admitting: Acute Care

## 2022-10-21 ENCOUNTER — Encounter: Payer: Self-pay | Admitting: Acute Care

## 2022-10-21 DIAGNOSIS — F17210 Nicotine dependence, cigarettes, uncomplicated: Secondary | ICD-10-CM | POA: Diagnosis not present

## 2022-10-21 NOTE — Patient Instructions (Signed)
Thank you for participating in the [facility]. It was our pleasure to meet you today. We will call you with the results of your scan within the next few days. Your scan will be assigned a Lung RADS category score by the physicians reading the scans.  This Lung RADS score determines follow up scanning.  See below for description of categories, and follow up screening recommendations. We will be in touch to schedule your follow up screening annually or based on recommendations of our providers. We will fax a copy of your scan results to your Primary Care Physician, or the physician who referred you to the program, to ensure they have the results. Please call the office if you have any questions or concerns regarding your scanning experience or results.  Our office number is [phone]. Please speak with Xavier Phelps, RN. , or  Xavier Buckner RN, They are  our Lung Cancer Screening RN.'s If They are unavailable when you call, Please leave a message on the voice mail. We will return your call at our earliest convenience.This voice mail is monitored several times a day.  Remember, if your scan is normal, we will scan you annually as long as you continue to meet the criteria for the program. (Age 50-80, Current smoker or smoker who has quit within the last 15 years). If you are a smoker, remember, quitting is the single most powerful action that you can take to decrease your risk of lung cancer and other pulmonary, breathing related problems. We know quitting is hard, and we are here to help.  Please let us know if there is anything we can do to help you meet your goal of quitting. If you are a former smoker, congratulations. We are proud of you! Remain smoke free! Remember you can refer friends or family members through the number above.  We will screen them to make sure they meet criteria for the program. Thank you for helping us take better care of you by  participating in Lung Screening.  You can receive free nicotine replacement therapy ( patches, gum or mints) by calling 1-800-QUIT NOW. Please call so we can get you on the path to becoming  a non-smoker. I know it is hard, but you can do this!  Lung RADS Categories:  Lung RADS 1: no nodules or definitely non-concerning nodules.  Recommendation is for a repeat annual scan in 12 months.  Lung RADS 2:  nodules that are non-concerning in appearance and behavior with a very low likelihood of becoming an active cancer. Recommendation is for a repeat annual scan in 12 months.  Lung RADS 3: nodules that are probably non-concerning , includes nodules with a low likelihood of becoming an active cancer.  Recommendation is for a 6-month repeat screening scan. Often noted after an upper respiratory illness. We will be in touch to make sure you have no questions, and to schedule your 6-month scan.  Lung RADS 4 A: nodules with concerning findings, recommendation is most often for a follow up scan in 3 months or additional testing based on our provider's assessment of the scan. We will be in touch to make sure you have no questions and to schedule the recommended 3 month follow up scan.  Lung RADS 4 B:  indicates findings that are concerning. We will be in touch with you to schedule additional diagnostic testing based on our provider's  assessment of the scan.  Other options for assistance in smoking cessation (   As covered by your insurance benefits)  Hypnosis for smoking cessation  [employer] [phone]  Acupuncture for smoking cessation  [facility] [phone]   

## 2022-10-21 NOTE — Progress Notes (Signed)
Virtual Visit via Telephone Note  I connected with Xavier Goodwin on 10/21/22 at  9:00 AM EDT by telephone and verified that I am speaking with the correct person using two identifiers.  Location: Patient:  At home Provider:  [address] [place], [place], [address]    I discussed the limitations, risks, security and privacy concerns of performing an evaluation and management service by telephone and the availability of in person appointments. I also discussed with the patient that there may be a patient responsible charge related to this service. The patient expressed understanding and agreed to proceed.   Shared Decision Making Visit Lung Cancer Screening Program [phone])   Eligibility: Age 1 y.o. Pack Years Smoking History Calculation 47 pack years (# packs/per year x # years smoked) Recent History of coughing up blood  no Unexplained weight loss? no ( >Than 15 pounds within the last 6 months ) Prior History Lung / other cancer no (Diagnosis within the last 5 years already requiring surveillance chest CT Scans). Smoking Status Current Smoker Former Smokers: Years since quit:  NA  Quit Date:  NA  Visit Components: Discussion included one or more decision making aids. yes Discussion included risk/benefits of screening. yes Discussion included potential follow up diagnostic testing for abnormal scans. yes Discussion included meaning and risk of over diagnosis. yes Discussion included meaning and risk of False Positives. yes Discussion included meaning of total radiation exposure. yes  Counseling Included: Importance of adherence to annual lung cancer LDCT screening. yes Impact of comorbidities on ability to participate in the program. yes Ability and willingness to under diagnostic treatment. yes  Smoking Cessation Counseling: Current Smokers:  Discussed importance of smoking cessation. yes Information about tobacco cessation classes and interventions provided to  patient. yes Patient provided with "ticket" for LDCT Scan. yes Symptomatic Patient. no  Counseling NA Diagnosis Code: Tobacco Use Z72.0 Asymptomatic Patient yes  Counseling (Intermediate counseling: > three minutes counseling) A3557 Former Smokers:  Discussed the importance of maintaining cigarette abstinence. yes Diagnosis Code: Personal History of Nicotine Dependence. D22.025 Information about tobacco cessation classes and interventions provided to patient. Yes Patient provided with "ticket" for LDCT Scan. yes Written Order for Lung Cancer Screening with LDCT placed in Epic. Yes (CT Chest Lung Cancer Screening Low Dose W/O CM) [id number] Z12.2-Screening of respiratory organs Z87.891-Personal history of nicotine dependence   Bevelyn Ngo, NP 10/21/2022

## 2022-10-22 ENCOUNTER — Ambulatory Visit
Admission: RE | Admit: 2022-10-22 | Discharge: 2022-10-22 | Disposition: A | Discharge status: Home / Self Care | Payer: 59 | Source: Ambulatory Visit | Attending: Acute Care | Admitting: Acute Care

## 2022-10-22 DIAGNOSIS — Z122 Encounter for screening for malignant neoplasm of respiratory organs: Secondary | ICD-10-CM

## 2022-10-22 DIAGNOSIS — F17210 Nicotine dependence, cigarettes, uncomplicated: Secondary | ICD-10-CM

## 2022-10-22 DIAGNOSIS — Z87891 Personal history of nicotine dependence: Secondary | ICD-10-CM

## 2022-10-29 ENCOUNTER — Other Ambulatory Visit: Payer: Self-pay

## 2022-10-29 DIAGNOSIS — Z122 Encounter for screening for malignant neoplasm of respiratory organs: Secondary | ICD-10-CM

## 2022-10-29 DIAGNOSIS — F17210 Nicotine dependence, cigarettes, uncomplicated: Secondary | ICD-10-CM

## 2022-10-29 DIAGNOSIS — Z87891 Personal history of nicotine dependence: Secondary | ICD-10-CM

## 2022-11-10 ENCOUNTER — Other Ambulatory Visit: Payer: Self-pay | Admitting: Physician Assistant

## 2022-11-25 ENCOUNTER — Ambulatory Visit: Payer: 59 | Admitting: Family Medicine

## 2023-02-08 ENCOUNTER — Telehealth: Payer: Self-pay | Admitting: Family Medicine

## 2023-02-08 NOTE — Telephone Encounter (Signed)
  Last seen 05/19/22. Cx appt 11/25/22. Notes state he no longer needed.  Phone room: pt will need to come in for appt. Please call and offer 02/25/23 at 9am with Amy Lomax,NP for further evaluation.

## 2023-02-08 NOTE — Telephone Encounter (Signed)
Pt's wife called and LVM stating that she is needing to speak to the provider regarding the pt's night terrors. She states that they are getting worse and the pt is not using his cpap  machine. Please advise.

## 2023-02-09 ENCOUNTER — Encounter: Payer: Self-pay | Admitting: Physician Assistant

## 2023-02-09 ENCOUNTER — Ambulatory Visit: Payer: 59 | Attending: Physician Assistant | Admitting: Physician Assistant

## 2023-02-09 VITALS — BP 124/70 | HR 75 | Ht 73.0 in | Wt 202.0 lb

## 2023-02-09 DIAGNOSIS — Z720 Tobacco use: Secondary | ICD-10-CM

## 2023-02-09 DIAGNOSIS — I2510 Atherosclerotic heart disease of native coronary artery without angina pectoris: Secondary | ICD-10-CM

## 2023-02-09 DIAGNOSIS — E785 Hyperlipidemia, unspecified: Secondary | ICD-10-CM

## 2023-02-09 MED ORDER — ROSUVASTATIN CALCIUM 40 MG PO TABS: 40.0000 mg | ORAL_TABLET | Freq: Every day | ORAL | 3 refills | Status: DC

## 2023-02-09 NOTE — Patient Instructions (Signed)
Medication Instructions:  Your physician recommends that you continue on your current medications as directed. Please refer to the Current Medication list given to you today.  *If you need a refill on your cardiac medications before your next appointment, please call your pharmacy*   Lab Work: NONE If you have labs (blood work) drawn today and your tests are completely normal, you will receive your results only by: MyChart Message (if you have MyChart) OR A paper copy in the mail If you have any lab test that is abnormal or we need to change your treatment, we will call you to review the results.   Testing/Procedures: EKG   Follow-Up: At [facility], you and your health needs are our priority.  As part of our continuing mission to provide you with exceptional heart care, we have created designated Provider Care Teams.  These Care Teams include your primary Cardiologist (physician) and Advanced Practice Providers (APPs -  Physician Assistants and Nurse Practitioners) who all work together to provide you with the care you need, when you need it.  We recommend signing up for the patient portal called "MyChart".  Sign up information is provided on this After Visit Summary.  MyChart is used to connect with patients for Virtual Visits (Telemedicine).  Patients are able to view lab/test results, encounter notes, upcoming appointments, etc.  Non-urgent messages can be sent to your provider as well.   To learn more about what you can do with MyChart, go to [email or web address].    Your next appointment:   1 year(s)  Provider:   Charlton Haws, MD     Other Instructions  YOUR PROVIDER RECOMMENDS THAT YOU DO 150 MINUTES OF EXERCISE WEEKLY.   Managing the Challenge of Quitting Smoking Quitting smoking is a physical and mental challenge. You may have cravings, withdrawal symptoms, and temptation to smoke. Before quitting, work with your health care provider to make a plan that can  help you manage quitting. Making a plan before you quit may keep you from smoking when you have the urge to smoke while trying to quit. How to manage lifestyle changes Managing stress Stress can make you want to smoke, and wanting to smoke may cause stress. It is important to find ways to manage your stress. You could try some of the following: Practice relaxation techniques. Breathe slowly and deeply, in through your nose and out through your mouth. Listen to music. Soak in a bath or take a shower. Imagine a peaceful place or vacation. Get some support. Talk with family or friends about your stress. Join a support group. Talk with a counselor or therapist. Get some physical activity. Go for a walk, run, or bike ride. Play a favorite sport. Practice yoga.  Medicines Talk with your health care provider about medicines that might help you deal with cravings and make quitting easier for you. Relationships Social situations can be difficult when you are quitting smoking. To manage this, you can: Avoid parties and other social situations where people might be smoking. Avoid alcohol. Leave right away if you have the urge to smoke. Explain to your family and friends that you are quitting smoking. Ask for support and let them know you might be a bit grumpy. Plan activities where smoking is not an option. General instructions Be aware that many people gain weight after they quit smoking. However, not everyone does. To keep from gaining weight, have a plan in place before you quit, and stick to the plan after you  quit. Your plan should include: Eating healthy snacks. When you have a craving, it may help to: Eat popcorn, or try carrots, celery, or other cut vegetables. Chew sugar-free gum. Changing how you eat. Eat small portion sizes at meals. Eat 4-6 small meals throughout the day instead of 1-2 large meals a day. Be mindful when you eat. You should avoid watching television or doing other  things that might distract you as you eat. Exercising regularly. Make time to exercise each day. If you do not have time for a long workout, do short bouts of exercise for 5-10 minutes several times a day. Do some form of strengthening exercise, such as weight lifting. Do some exercise that gets your heart beating and causes you to breathe deeply, such as walking fast, running, swimming, or biking. This is very important. Drinking plenty of water or other low-calorie or no-calorie drinks. Drink enough fluid to keep your urine pale yellow.  How to recognize withdrawal symptoms Your body and mind may experience discomfort as you try to get used to not having nicotine in your system. These effects are called withdrawal symptoms. They may include: Feeling hungrier than normal. Having trouble concentrating. Feeling irritable or restless. Having trouble sleeping. Feeling depressed. Craving a cigarette. These symptoms may surprise you, but they are normal to have when quitting smoking. To manage withdrawal symptoms: Avoid places, people, and activities that trigger your cravings. Remember why you want to quit. Get plenty of sleep. Avoid coffee and other drinks that contain caffeine. These may worsen some of your symptoms. How to manage cravings Come up with a plan for how to deal with your cravings. The plan should include the following: A definition of the specific situation you want to deal with. An activity or action you will take to replace smoking. A clear idea for how this action will help. The name of someone who could help you with this. Cravings usually last for 5-10 minutes. Consider taking the following actions to help you with your plan to deal with cravings: Keep your mouth busy. Chew sugar-free gum. Suck on hard candies or a straw. Brush your teeth. Keep your hands and body busy. Change to a different activity right away. Squeeze or play with a ball. Do an activity or a  hobby, such as making bead jewelry, practicing needlepoint, or working with wood. Mix up your normal routine. Take a short exercise break. Go for a quick walk, or run up and down stairs. Focus on doing something kind or helpful for someone else. Call a friend or family member to talk during a craving. Join a support group. Contact a quitline. Where to find support To get help or find a support group: Call the National Cancer Institute's Smoking Quitline: 1-800-QUIT-NOW 630-710-9299) Text QUIT to SmokefreeTXT: 454098 Where to find more information Visit these websites to find more information on quitting smoking: U.S. Department of Health and Human Services: www.smokefree.gov American Lung Association: www.freedomfromsmoking.org Centers for Disease Control and Prevention (CDC): FootballExhibition.com.br American Heart Association: www.heart.org Contact a health care provider if: You want to change your plan for quitting. The medicines you are taking are not helping. Your eating feels out of control or you cannot sleep. You feel depressed or become very anxious. Summary Quitting smoking is a physical and mental challenge. You will face cravings, withdrawal symptoms, and temptation to smoke again. Preparation can help you as you go through these challenges. Try different techniques to manage stress, handle social situations, and prevent weight gain.  You can deal with cravings by keeping your mouth busy (such as by chewing gum), keeping your hands and body busy, calling family or friends, or contacting a quitline for people who want to quit smoking. You can deal with withdrawal symptoms by avoiding places where people smoke, getting plenty of rest, and avoiding drinks that contain caffeine. This information is not intended to replace advice given to you by your health care provider. Make sure you discuss any questions you have with your health care provider. Document Revised: 03/14/2021 Document Reviewed:  03/14/2021 Elsevier Patient Education  2024 [employer].

## 2023-02-15 NOTE — Telephone Encounter (Signed)
Called and LVM for wife to call back to confirm if she will be able to make that appt with pt.

## 2023-02-16 NOTE — Telephone Encounter (Signed)
I called pt and asked if the appt held 02-25-2023 at 0900 would work for him.  Thanksgiving is the next week after that.  He said that would be ok.  I confirmed and made appt for cpap and night terrors.

## 2023-02-18 ENCOUNTER — Telehealth: Payer: Self-pay | Admitting: Family Medicine

## 2023-02-18 NOTE — Telephone Encounter (Signed)
Pt's wife, shelby can contact me regarding the night terrors. Feel like was never address previous office visits. Want to know if you can address at [facility] or do we need to go to a psychiatrist. Would like a call back.

## 2023-02-22 NOTE — Telephone Encounter (Signed)
Called wife back. Relayed AL,NP message. She verbalized understanding.  Cx appt 02/25/23. Appt not needed, seeing psychiatry instead. PCP referred him.

## 2023-02-22 NOTE — Telephone Encounter (Signed)
Amy, looks like Sandy made appt. Ok for him to keep to further discuss/evaluate?

## 2023-02-25 ENCOUNTER — Ambulatory Visit: Payer: 59 | Admitting: Family Medicine

## 2023-04-13 ENCOUNTER — Encounter (HOSPITAL_COMMUNITY)
Admission: EM | Disposition: A | Discharge status: Home / Self Care | Payer: Self-pay | Source: Home / Self Care | Attending: Emergency Medicine

## 2023-04-13 ENCOUNTER — Ambulatory Visit (HOSPITAL_COMMUNITY)
Admission: EM | Admit: 2023-04-13 | Discharge: 2023-04-13 | Disposition: A | Discharge status: Home / Self Care | Payer: 59 | Attending: Emergency Medicine | Admitting: Emergency Medicine

## 2023-04-13 ENCOUNTER — Encounter (HOSPITAL_COMMUNITY): Payer: Self-pay | Admitting: *Deleted

## 2023-04-13 ENCOUNTER — Emergency Department (HOSPITAL_COMMUNITY): Payer: 59

## 2023-04-13 ENCOUNTER — Emergency Department (HOSPITAL_COMMUNITY): Payer: 59 | Admitting: Anesthesiology

## 2023-04-13 ENCOUNTER — Emergency Department (HOSPITAL_BASED_OUTPATIENT_CLINIC_OR_DEPARTMENT_OTHER): Payer: 59 | Admitting: Anesthesiology

## 2023-04-13 ENCOUNTER — Other Ambulatory Visit: Payer: Self-pay

## 2023-04-13 ENCOUNTER — Ambulatory Visit: Payer: Self-pay | Admitting: Urology

## 2023-04-13 DIAGNOSIS — Z9079 Acquired absence of other genital organ(s): Secondary | ICD-10-CM | POA: Diagnosis not present

## 2023-04-13 DIAGNOSIS — I10 Essential (primary) hypertension: Secondary | ICD-10-CM | POA: Diagnosis not present

## 2023-04-13 DIAGNOSIS — N320 Bladder-neck obstruction: Secondary | ICD-10-CM

## 2023-04-13 DIAGNOSIS — R310 Gross hematuria: Secondary | ICD-10-CM

## 2023-04-13 DIAGNOSIS — R319 Hematuria, unspecified: Secondary | ICD-10-CM | POA: Diagnosis present

## 2023-04-13 DIAGNOSIS — Y842 Radiological procedure and radiotherapy as the cause of abnormal reaction of the patient, or of later complication, without mention of misadventure at the time of the procedure: Secondary | ICD-10-CM | POA: Diagnosis not present

## 2023-04-13 DIAGNOSIS — N138 Other obstructive and reflux uropathy: Secondary | ICD-10-CM | POA: Diagnosis not present

## 2023-04-13 DIAGNOSIS — Z79899 Other long term (current) drug therapy: Secondary | ICD-10-CM | POA: Diagnosis not present

## 2023-04-13 DIAGNOSIS — N3041 Irradiation cystitis with hematuria: Secondary | ICD-10-CM | POA: Diagnosis not present

## 2023-04-13 DIAGNOSIS — F17210 Nicotine dependence, cigarettes, uncomplicated: Secondary | ICD-10-CM | POA: Diagnosis not present

## 2023-04-13 DIAGNOSIS — N3289 Other specified disorders of bladder: Secondary | ICD-10-CM | POA: Diagnosis not present

## 2023-04-13 HISTORY — DX: Sleep apnea, unspecified: G47.30

## 2023-04-13 HISTORY — PX: CYSTOSCOPY WITH FULGERATION: SHX6638

## 2023-04-13 LAB — CBC WITH DIFFERENTIAL/PLATELET
Abs Immature Granulocytes: 0.02 10*3/uL (ref 0.00–0.07)
Basophils Absolute: 0 10*3/uL (ref 0.0–0.1)
Basophils Relative: 0 %
Eosinophils Absolute: 0 10*3/uL (ref 0.0–0.5)
Eosinophils Relative: 0 %
HCT: 38.4 % — ABNORMAL LOW (ref 39.0–52.0)
Hemoglobin: 13.2 g/dL (ref 13.0–17.0)
Immature Granulocytes: 0 %
Lymphocytes Relative: 6 %
Lymphs Abs: 0.8 10*3/uL (ref 0.7–4.0)
MCH: 33.4 pg (ref 26.0–34.0)
MCHC: 34.4 g/dL (ref 30.0–36.0)
MCV: 97.2 fL (ref 80.0–100.0)
Monocytes Absolute: 0.6 10*3/uL (ref 0.1–1.0)
Monocytes Relative: 5 %
Neutro Abs: 10.7 10*3/uL — ABNORMAL HIGH (ref 1.7–7.7)
Neutrophils Relative %: 89 %
Platelets: 218 10*3/uL (ref 150–400)
RBC: 3.95 MIL/uL — ABNORMAL LOW (ref 4.22–5.81)
RDW: 14.6 % (ref 11.5–15.5)
WBC: 12.1 10*3/uL — ABNORMAL HIGH (ref 4.0–10.5)
nRBC: 0 % (ref 0.0–0.2)

## 2023-04-13 LAB — BASIC METABOLIC PANEL
Anion gap: 12 (ref 5–15)
BUN: 15 mg/dL (ref 8–23)
CO2: 21 mmol/L — ABNORMAL LOW (ref 22–32)
Calcium: 9.5 mg/dL (ref 8.9–10.3)
Chloride: 102 mmol/L (ref 98–111)
Creatinine, Ser: 1.09 mg/dL (ref 0.61–1.24)
GFR, Estimated: >60 mL/min (ref >60)
Glucose, Bld: 126 mg/dL — ABNORMAL HIGH (ref 70–99)
Potassium: 3.1 mmol/L — ABNORMAL LOW (ref 3.5–5.1)
Sodium: 135 mmol/L (ref 135–145)

## 2023-04-13 SURGERY — CYSTOSCOPY, WITH BLADDER FULGURATION
Anesthesia: General | Site: Bladder

## 2023-04-13 MED ORDER — LACTATED RINGERS IV SOLN: INTRAVENOUS | Status: DC

## 2023-04-13 MED ORDER — MIDAZOLAM HCL 2 MG/2ML IJ SOLN
INTRAMUSCULAR | Status: AC
Start: 2023-04-13 — End: ?
  Filled 2023-04-13: qty 2

## 2023-04-13 MED ORDER — SUGAMMADEX SODIUM 200 MG/2ML IV SOLN
INTRAVENOUS | Status: DC | PRN
  Administered 2023-04-13: 360 mg via INTRAVENOUS

## 2023-04-13 MED ORDER — ORAL CARE MOUTH RINSE: 15.0000 mL | Freq: Once | OROMUCOSAL | Status: AC

## 2023-04-13 MED ORDER — ONDANSETRON HCL 4 MG/2ML IJ SOLN
INTRAMUSCULAR | Status: DC | PRN
  Administered 2023-04-13: 4 mg via INTRAVENOUS

## 2023-04-13 MED ORDER — CHLORHEXIDINE GLUCONATE 0.12 % MT SOLN
15.0000 mL | Freq: Once | OROMUCOSAL | Status: AC
  Administered 2023-04-13: 15 mL via OROMUCOSAL

## 2023-04-13 MED ORDER — ONDANSETRON 4 MG PO TBDP: 4.0000 mg | ORAL_TABLET | Freq: Once | ORAL | Status: DC

## 2023-04-13 MED ORDER — FENTANYL CITRATE PF 50 MCG/ML IJ SOSY: 25.0000 ug | PREFILLED_SYRINGE | INTRAMUSCULAR | Status: DC | PRN

## 2023-04-13 MED ORDER — FENTANYL CITRATE (PF) 100 MCG/2ML IJ SOLN
INTRAMUSCULAR | Status: AC
  Filled 2023-04-13: qty 2

## 2023-04-13 MED ORDER — FENTANYL CITRATE (PF) 100 MCG/2ML IJ SOLN
INTRAMUSCULAR | Status: DC | PRN
  Administered 2023-04-13: 100 ug via INTRAVENOUS

## 2023-04-13 MED ORDER — PROPOFOL 10 MG/ML IV BOLUS
INTRAVENOUS | Status: AC
  Filled 2023-04-13: qty 20

## 2023-04-13 MED ORDER — SODIUM CHLORIDE 0.9 % IV SOLN
1.0000 g | INTRAVENOUS | Status: DC
  Filled 2023-04-13: qty 10

## 2023-04-13 MED ORDER — DEXTROSE 5 % IV SOLN
INTRAVENOUS | Status: DC | PRN
  Administered 2023-04-13: 2 g via INTRAVENOUS

## 2023-04-13 MED ORDER — STERILE WATER FOR IRRIGATION IR SOLN
Status: DC | PRN
  Administered 2023-04-13 (×2): 3000 mL

## 2023-04-13 MED ORDER — SODIUM CHLORIDE 0.9 % IV SOLN
INTRAVENOUS | Status: AC
  Filled 2023-04-13: qty 20

## 2023-04-13 MED ORDER — ROCURONIUM BROMIDE 10 MG/ML (PF) SYRINGE
PREFILLED_SYRINGE | INTRAVENOUS | Status: DC | PRN
  Administered 2023-04-13: 60 mg via INTRAVENOUS

## 2023-04-13 MED ORDER — PHENYLEPHRINE 80 MCG/ML (10ML) SYRINGE FOR IV PUSH (FOR BLOOD PRESSURE SUPPORT)
PREFILLED_SYRINGE | INTRAVENOUS | Status: DC | PRN
  Administered 2023-04-13 (×2): 80 ug via INTRAVENOUS
  Administered 2023-04-13: 160 ug via INTRAVENOUS

## 2023-04-13 MED ORDER — DEXAMETHASONE SODIUM PHOSPHATE 10 MG/ML IJ SOLN
INTRAMUSCULAR | Status: DC | PRN
  Administered 2023-04-13: 10 mg via INTRAVENOUS

## 2023-04-13 MED ORDER — ONDANSETRON HCL 4 MG/2ML IJ SOLN
4.0000 mg | Freq: Once | INTRAMUSCULAR | Status: AC
  Administered 2023-04-13: 4 mg via INTRAVENOUS
  Filled 2023-04-13: qty 2

## 2023-04-13 MED ORDER — LIDOCAINE 2% (20 MG/ML) 5 ML SYRINGE
INTRAMUSCULAR | Status: DC | PRN
  Administered 2023-04-13: 80 mg via INTRAVENOUS

## 2023-04-13 MED ORDER — MIDAZOLAM HCL 5 MG/5ML IJ SOLN
INTRAMUSCULAR | Status: DC | PRN
  Administered 2023-04-13: 2 mg via INTRAVENOUS

## 2023-04-13 MED ORDER — PROPOFOL 10 MG/ML IV BOLUS
INTRAVENOUS | Status: DC | PRN
  Administered 2023-04-13: 180 mg via INTRAVENOUS

## 2023-04-13 MED ORDER — ACETAMINOPHEN 10 MG/ML IV SOLN
1000.0000 mg | Freq: Once | INTRAVENOUS | Status: DC | PRN
Start: 2023-04-13 — End: 2023-04-14

## 2023-04-13 MED ORDER — OXYCODONE-ACETAMINOPHEN 5-325 MG PO TABS
1.0000 | ORAL_TABLET | Freq: Once | ORAL | Status: DC
Start: 2023-04-13 — End: 2023-04-13

## 2023-04-13 MED ORDER — HYDROMORPHONE HCL 1 MG/ML IJ SOLN
0.5000 mg | Freq: Once | INTRAMUSCULAR | Status: AC
  Administered 2023-04-13: 0.5 mg via INTRAVENOUS
  Filled 2023-04-13: qty 1

## 2023-04-13 SURGICAL SUPPLY — 19 items
BAG URINE DRAIN 2000ML AR STRL (UROLOGICAL SUPPLIES) IMPLANT
BAG URO CATCHER STRL LF (MISCELLANEOUS) ×1 IMPLANT
CATH FOLEY 2W COUNCIL 20FR 5CC (CATHETERS) IMPLANT
CATH SET URETHRAL DILATOR (CATHETERS) IMPLANT
DRAPE FOOT SWITCH (DRAPES) ×1 IMPLANT
DRSG TELFA 3X8 NADH STRL (GAUZE/BANDAGES/DRESSINGS) IMPLANT
ELECT REM PT RETURN 15FT ADLT (MISCELLANEOUS) ×1 IMPLANT
EVACUATOR MICROVAS BLADDER (UROLOGICAL SUPPLIES) IMPLANT
GLOVE BIO SURGEON STRL SZ8.5 (GLOVE) ×1 IMPLANT
GOWN STRL REUS W/ TWL XL LVL3 (GOWN DISPOSABLE) ×1 IMPLANT
KIT TURNOVER KIT A (KITS) IMPLANT
LOOP CUT BIPOLAR 24F LRG (ELECTROSURGICAL) IMPLANT
MANIFOLD NEPTUNE II (INSTRUMENTS) ×1 IMPLANT
PACK CYSTO (CUSTOM PROCEDURE TRAY) ×1 IMPLANT
PLUG CATH AND CAP STRL 200 (CATHETERS) ×1 IMPLANT
SYR TOOMEY IRRIG 70ML (MISCELLANEOUS) ×1 IMPLANT
SYRINGE TOOMEY IRRIG 70ML (MISCELLANEOUS) IMPLANT
TUBING CONNECTING 10 (TUBING) ×1 IMPLANT
TUBING UROLOGY SET (TUBING) ×1 IMPLANT

## 2023-04-13 NOTE — ED Provider Notes (Signed)
 [place] EMERGENCY DEPARTMENT AT [facility] Provider Note   CSN: [id number] Arrival date & time: 04/13/23  1339     History  Chief Complaint  Patient presents with   Flank Pain    Xavier Goodwin is a 63 y.o. male.   Flank Pain  Patient presents with left flank and groin pain.  With hematuria.  History of kidney stones.  Has had functionally a unilateral kidney on the left due to previous stones.  States been passing blood and feels that he has to urinate but nothing looks some blood coming out.  Denies fevers.  Appears uncomfortable.    Past Medical History:  Diagnosis Date   Chest pain    none recently since i stopped drinking the big gulp of caffeine    Cholelithiasis 08/2016   History of bronchitis    History of kidney stones    History of urinary tract infection    Hypercholesteremia    Pneumonia    Prostate cancer (HCC)    Sepsis (HCC) 08/2016    Home Medications Prior to Admission medications   Medication Sig Start Date End Date Taking? Authorizing Provider  aspirin  EC 81 MG tablet Take 1 tablet (81 mg total) by mouth daily. 10/23/20   Parthenia Olivia HERO, PA-C  ezetimibe  (ZETIA ) 10 MG tablet TAKE 1 TABLET BY MOUTH DAILY 11/11/22   Parthenia Olivia HERO, PA-C  hydrochlorothiazide  (HYDRODIURIL ) 25 MG tablet Take 1 tablet (25 mg total) by mouth daily. 02/03/22   Parthenia Olivia HERO, PA-C  rosuvastatin  (CRESTOR ) 40 MG tablet Take 1 tablet (40 mg total) by mouth daily. 02/09/23   Parthenia Olivia HERO, PA-C      Allergies    Patient has no known allergies.    Review of Systems   Review of Systems  Genitourinary:  Positive for flank pain.    Physical Exam Updated Vital Signs BP (!) 166/108 (BP Location: Left Arm)   Pulse 95   Temp 99 F (37.2 C) (Oral)   Resp 18   Ht 6' 1 (1.854 m)   Wt 90.7 kg   SpO2 99%   BMI 26.39 kg/m  Physical Exam Vitals and nursing note reviewed.  Cardiovascular:     Rate and Rhythm: Regular rhythm.  Pulmonary:     Breath  sounds: No wheezing.  Abdominal:     Tenderness: There is abdominal tenderness.     Comments: Some left lower quadrant tenderness.  No hernia palpated.  Genitourinary:    Comments: CVA tenderness on left.  Patient moving around uncomfortably. Neurological:     Mental Status: He is alert.     ED Results / Procedures / Treatments   Labs (all labs ordered are listed, but only abnormal results are displayed) Labs Reviewed  BASIC METABOLIC PANEL - Abnormal; Notable for the following components:      Result Value   Potassium 3.1 (*)    CO2 21 (*)    Glucose, Bld 126 (*)    All other components within normal limits  CBC WITH DIFFERENTIAL/PLATELET - Abnormal; Notable for the following components:   WBC 12.1 (*)    RBC 3.95 (*)    HCT 38.4 (*)    Neutro Abs 10.7 (*)    All other components within normal limits  URINALYSIS, ROUTINE W REFLEX MICROSCOPIC    EKG None  Radiology CT Renal Stone Study Result Date: 04/13/2023 CLINICAL DATA:  Right-sided flank pain EXAM: CT ABDOMEN AND PELVIS WITHOUT CONTRAST TECHNIQUE: Multidetector CT  imaging of the abdomen and pelvis was performed following the standard protocol without IV contrast. RADIATION DOSE REDUCTION: This exam was performed according to the departmental dose-optimization program which includes automated exposure control, adjustment of the mA and/or kV according to patient size and/or use of iterative reconstruction technique. COMPARISON:  CT 08/10/2016 renal scan 06/18/2021 FINDINGS: Lower chest: Lung bases demonstrate no acute airspace disease. Hepatobiliary: No focal hepatic abnormality. No biliary dilatation. Large calcified gallstone measuring 2.4 cm. No right upper quadrant inflammation. Pancreas: Unremarkable. No pancreatic ductal dilatation or surrounding inflammatory changes. Spleen: Normal in size without focal abnormality. Adrenals/Urinary Tract: Adrenal glands are within normal limits. Interval right renal atrophy. Moderate  right hydronephrosis on a no obstructing ureteral stone. Mild to moderate left hydronephrosis and hydroureter which is new. Hyperdense mass within the posterior bladder measuring 5.4 x 3.9 cm. Cyst midpole left kidney, no imaging follow-up is recommended. Caliber change with narrowed appearing distal left ureter at the level of mid pelvis, ureteral insertion at the level of left hyperdense bladder mass. Bladder is distended to the labral of the umbilicus. Small nonobstructing left kidney stone. Stomach/Bowel: The stomach is nonenlarged. There is no dilated small bowel. No acute bowel wall thickening. Vascular/Lymphatic: Nonaneurysmal aorta with atherosclerosis. No suspicious lymph nodes Reproductive: Prostatectomy Other: Negative for pelvic effusion or free air Musculoskeletal: No 8 or suspicious osseous abnormality. Grade 1 anterolisthesis L5 on S1. IMPRESSION: 1. Bladder is distended to the level of the umbilicus. There is a 5.4 cm hyperdense mass within the posterior bladder, either representing hematoma or bladder mass. Correlation with direct visualization is recommended. 2. Interval right renal atrophy with moderate right hydronephrosis but no obstructing stone. New mild to moderate left hydronephrosis and hydroureter. Caliber change of the distal left ureter at the level of mid pelvis, raising possibility of stricture. Left UVJ appears involved by the hyperdense bladder mass or hematoma. 3. Cholelithiasis. 4. Aortic atherosclerosis. Aortic Atherosclerosis (ICD10-I70.0). Electronically Signed   By: Luke Bun M.D.   On: 04/13/2023 15:27    Procedures Procedures    Medications Ordered in ED Medications  HYDROmorphone  (DILAUDID ) injection 0.5 mg (0.5 mg Intravenous Given 04/13/23 1533)  ondansetron  (ZOFRAN ) injection 4 mg (4 mg Intravenous Given 04/13/23 1533)    ED Course/ Medical Decision Making/ A&P                                 Medical Decision Making Amount and/or Complexity of Data  Reviewed Labs: ordered.  Risk Prescription drug management.   Patient with flank pain groin pain and hematuria.  Difficulty urinating.  Reviewed previous imaging and does have history of stones on the right with functionally 93% of his kidney for coming from the left.  Now with left-sided pain.  Differential diagnosis does include kidney stone, obstruction, hematuria.  Will treat symptomatically.  CT scan done independently interpreted does show some hydronephrosis on the left with likely clot in the bladder.  Discussed with Dr. Shona from urology.  Will see patient.  Catheter cart placed outside room.  Care turned over to Dr Laurice          Final Clinical Impression(s) / ED Diagnoses Final diagnoses:  Hematuria, unspecified type  Bladder outlet obstruction    Rx / DC Orders ED Discharge Orders     None         Patsey Lot, MD 04/13/23 1617

## 2023-04-13 NOTE — H&P (Signed)
 Urology Consult   Physician requesting consult: Patsey  Reason for consult: urinary retention, BOO, bilateral hydro  History of Present Illness: Xavier Goodwin is a 63 y.o. with a complex past urologic history well-known to our service.  Patient has a long history of recurrent nephrolithiasis status post multiple prior endoscopic procedures as well as ESWL with subsequent development of an atrophic poorly functioning right kidney.  Metabolic evaluation previously revealed hypercalciuria and he is on HCTZ.  Patient also has a history of locally advanced prostate cancer status post robotic assisted laparoscopic prostatectomy 2018.  Patient had Gleason 4+3 equal 7 with multifocal positive margins, pretreatment PSA of 13, and underwent adjuvant ADT and RT.  PSA has remained undetectable  Previous cystoscopy has demonstrated a bladder neck contracture.  Patient woke up today and when he voided he had some difficulty voiding and his urine was entirely bloody.  He states that otherwise he is felt fairly well although he has had some low back pain over the last week or so but he attributed to doing some strenuous work.  He denies any fever chills or flank pain.  In the emergency department attempts at placement of a Foley catheter by bladder staff were unsuccessful.  The CT scan does show an atrophic right kidney, mild left hydro and a markedly distended bladder with hyperdense material question clot versus mass.  In the emergency department flexible cystoscopy was performed.  I was able to visualize the bladder neck which appeared to have a very tight dense bladder neck contracture.  I was unable to dilate.    Past Medical History:  Diagnosis Date   Chest pain    none recently since i stopped drinking the big gulp of caffeine    Cholelithiasis 08/2016   History of bronchitis    History of kidney stones    History of urinary tract infection    Hypercholesteremia    Pneumonia     Prostate cancer (HCC)    Sepsis (HCC) 08/2016    Past Surgical History:  Procedure Laterality Date   CARDIAC CATHETERIZATION N/A 11/25/2015   Procedure: Left Heart Cath and Coronary Angiography;  Surgeon: Maude JAYSON Emmer, MD;  Location: [facility] INVASIVE CV LAB;  Service: Cardiovascular;  Laterality: N/A;   CYSTOSCOPY W/ URETERAL STENT PLACEMENT Right 08/10/2016   Procedure: CYSTOSCOPY WITH RETROGRADE PYELOGRAM RIGHT URETERAL STENT PLACEMENT;  Surgeon: Watt Rush, MD;  Location: [facility];  Service: Urology;  Laterality: Right;   CYSTOSCOPY/URETEROSCOPY/HOLMIUM LASER/STENT PLACEMENT Right 03/24/2021   Procedure: CYSTOSCOPY/RETROGRADE/URETEROSCOPY/HOLMIUM LASER/STENT PLACEMENT;  Surgeon: Renda Glance, MD;  Location: [facility];  Service: Urology;  Laterality: Right;   EXTRACORPOREAL SHOCK WAVE LITHOTRIPSY Right 08/24/2016   Procedure: RIGHT EXTRACORPOREAL SHOCK WAVE LITHOTRIPSY (ESWL);  Surgeon: Ottelin, Mark, MD;  Location: [facility];  Service: Urology;  Laterality: Right;   PELVIC LYMPH NODE DISSECTION Bilateral 07/02/2016   Procedure: BILATERAL PELVIC LYMPH NODE DISSECTION;  Surgeon: Glance Renda, MD;  Location: [facility];  Service: Urology;  Laterality: Bilateral;   ROBOT ASSISTED LAPAROSCOPIC RADICAL PROSTATECTOMY N/A 07/02/2016   Procedure: XI ROBOTIC ASSISTED LAPAROSCOPIC RADICAL PROSTATECTOMY LEVEL 2;  Surgeon: Glance Renda, MD;  Location: [facility];  Service: Urology;  Laterality: N/A;    Medications:  Home meds:    Scheduled Meds: Continuous Infusions: PRN Meds:.  Allergies: No Known Allergies  Family History  Problem Relation Age of Onset   Hypertension Father    Heart attack Father    Diabetes Mother    Cancer Mother  Lupus Sister     Social History:  reports that he has been smoking cigarettes. He has a 30 pack-year smoking history. He has never used smokeless tobacco. He reports that he does not drink alcohol and does not use drugs.  ROS: A complete review of systems was performed.   All systems are negative except for pertinent findings as noted.  Physical Exam:  Vital signs in last 24 hours: Temp:  [99 F (37.2 C)] 99 F (37.2 C) (01/07 1347) Pulse Rate:  [95] 95 (01/07 1347) Resp:  [18] 18 (01/07 1347) BP: (166)/(108) 166/108 (01/07 1347) SpO2:  [99 %] 99 % (01/07 1347) Weight:  [90.7 kg] 90.7 kg (01/07 1356) Constitutional:  Alert and oriented, No acute distress Cardiovascular: Regular rate and rhythm, No JVD Respiratory: Normal respiratory effort, Lungs clear bilaterally GI: Abdomen is soft, bladder distended Genitourinary: nl uncirc phallus.  Nl testes.   Lymphatic: No lymphadenopathy Neurologic: Grossly intact, no focal deficits Psychiatric: Normal mood and affect  Laboratory Data:  Recent Labs    04/13/23 1511  WBC 12.1*  HGB 13.2  HCT 38.4*  PLT 218    Recent Labs    04/13/23 1511  NA 135  K 3.1*  CL 102  GLUCOSE 126*  BUN 15  CALCIUM  9.5  CREATININE 1.09     Results for orders placed or performed during the hospital encounter of 04/13/23 (from the past 24 hours)  Basic metabolic panel     Status: Abnormal   Collection Time: 04/13/23  3:11 PM  Result Value Ref Range   Sodium 135 135 - 145 mmol/L   Potassium 3.1 (L) 3.5 - 5.1 mmol/L   Chloride 102 98 - 111 mmol/L   CO2 21 (L) 22 - 32 mmol/L   Glucose, Bld 126 (H) 70 - 99 mg/dL   BUN 15 8 - 23 mg/dL   Creatinine, Ser 8.90 0.61 - 1.24 mg/dL   Calcium  9.5 8.9 - 10.3 mg/dL   GFR, Estimated >39 >39 mL/min   Anion gap 12 5 - 15  CBC with Differential     Status: Abnormal   Collection Time: 04/13/23  3:11 PM  Result Value Ref Range   WBC 12.1 (H) 4.0 - 10.5 K/uL   RBC 3.95 (L) 4.22 - 5.81 MIL/uL   Hemoglobin 13.2 13.0 - 17.0 g/dL   HCT 61.5 (L) 60.9 - 47.9 %   MCV 97.2 80.0 - 100.0 fL   MCH 33.4 26.0 - 34.0 pg   MCHC 34.4 30.0 - 36.0 g/dL   RDW 85.3 88.4 - 84.4 %   Platelets 218 150 - 400 K/uL   nRBC 0.0 0.0 - 0.2 %   Neutrophils Relative % 89 %   Neutro Abs 10.7 (H) 1.7 -  7.7 K/uL   Lymphocytes Relative 6 %   Lymphs Abs 0.8 0.7 - 4.0 K/uL   Monocytes Relative 5 %   Monocytes Absolute 0.6 0.1 - 1.0 K/uL   Eosinophils Relative 0 %   Eosinophils Absolute 0.0 0.0 - 0.5 K/uL   Basophils Relative 0 %   Basophils Absolute 0.0 0.0 - 0.1 K/uL   Immature Granulocytes 0 %   Abs Immature Granulocytes 0.02 0.00 - 0.07 K/uL   No results found for this or any previous visit (from the past 240 hours).  Renal Function: Recent Labs    04/13/23 1511  CREATININE 1.09   Estimated Creatinine Clearance: 77.4 mL/min (by C-G formula based on SCr of 1.09 mg/dL).  Radiologic Imaging:  CT Renal Stone Study Result Date: 04/13/2023 CLINICAL DATA:  Right-sided flank pain EXAM: CT ABDOMEN AND PELVIS WITHOUT CONTRAST TECHNIQUE: Multidetector CT imaging of the abdomen and pelvis was performed following the standard protocol without IV contrast. RADIATION DOSE REDUCTION: This exam was performed according to the departmental dose-optimization program which includes automated exposure control, adjustment of the mA and/or kV according to patient size and/or use of iterative reconstruction technique. COMPARISON:  CT 08/10/2016 renal scan 06/18/2021 FINDINGS: Lower chest: Lung bases demonstrate no acute airspace disease. Hepatobiliary: No focal hepatic abnormality. No biliary dilatation. Large calcified gallstone measuring 2.4 cm. No right upper quadrant inflammation. Pancreas: Unremarkable. No pancreatic ductal dilatation or surrounding inflammatory changes. Spleen: Normal in size without focal abnormality. Adrenals/Urinary Tract: Adrenal glands are within normal limits. Interval right renal atrophy. Moderate right hydronephrosis on a no obstructing ureteral stone. Mild to moderate left hydronephrosis and hydroureter which is new. Hyperdense mass within the posterior bladder measuring 5.4 x 3.9 cm. Cyst midpole left kidney, no imaging follow-up is recommended. Caliber change with narrowed appearing  distal left ureter at the level of mid pelvis, ureteral insertion at the level of left hyperdense bladder mass. Bladder is distended to the labral of the umbilicus. Small nonobstructing left kidney stone. Stomach/Bowel: The stomach is nonenlarged. There is no dilated small bowel. No acute bowel wall thickening. Vascular/Lymphatic: Nonaneurysmal aorta with atherosclerosis. No suspicious lymph nodes Reproductive: Prostatectomy Other: Negative for pelvic effusion or free air Musculoskeletal: No 8 or suspicious osseous abnormality. Grade 1 anterolisthesis L5 on S1. IMPRESSION: 1. Bladder is distended to the level of the umbilicus. There is a 5.4 cm hyperdense mass within the posterior bladder, either representing hematoma or bladder mass. Correlation with direct visualization is recommended. 2. Interval right renal atrophy with moderate right hydronephrosis but no obstructing stone. New mild to moderate left hydronephrosis and hydroureter. Caliber change of the distal left ureter at the level of mid pelvis, raising possibility of stricture. Left UVJ appears involved by the hyperdense bladder mass or hematoma. 3. Cholelithiasis. 4. Aortic atherosclerosis. Aortic Atherosclerosis (ICD10-I70.0). Electronically Signed   By: Luke Bun M.D.   On: 04/13/2023 15:27    I independently reviewed the above imaging studies.  Impression/Recommendation Bladder outlet obstruction in setting of know bnc s/p RRP and RT Gross hematuria with hyperdense bladder mass--? Clott vs tumor  Given inability to access bladder in the emergency department I recommend that we take the patient to the operating room and perform cystoscopy under anesthesia with dilation/resection of bladder neck contracture clot evacuation and assessment of his bladder urothelium.  Bladder biopsies/TURBT is indicated.  Rationale procedures including potential adverse events and complications were reviewed.  Informed consent obtained.  Layman JAYSON Hurst 04/13/2023, 5:23 PM

## 2023-04-13 NOTE — ED Triage Notes (Signed)
 Pt has history of kidney stones, pain in back, ? Rt side, this am urinating blood and clots, uncomfortable in triage.

## 2023-04-13 NOTE — ED Notes (Signed)
 Attempted a foley cath on pt x2. Both a 14 fr and a 16 fr were unsuccessful unable to thread after certain point, blockage.

## 2023-04-13 NOTE — Anesthesia Procedure Notes (Signed)
 Procedure Name: Intubation Date/Time: 04/13/2023 6:24 PM  Performed by: Vincenzo Show, CRNAPre-anesthesia Checklist: Patient identified, Emergency Drugs available, Suction available, Patient being monitored and Timeout performed Patient Re-evaluated:Patient Re-evaluated prior to induction Oxygen Delivery Method: Circle system utilized Preoxygenation: Pre-oxygenation with 100% oxygen Induction Type: IV induction Ventilation: Mask ventilation without difficulty Laryngoscope Size: Mac and 4 Grade View: Grade II Tube type: Oral Tube size: 7.5 mm Number of attempts: 1 Airway Equipment and Method: Stylet Placement Confirmation: ETT inserted through vocal cords under direct vision, positive ETCO2, CO2 detector and breath sounds checked- equal and bilateral Secured at: 23 cm Tube secured with: Tape Dental Injury: Teeth and Oropharynx as per pre-operative assessment  Comments: ATOI

## 2023-04-13 NOTE — Anesthesia Preprocedure Evaluation (Addendum)
 Anesthesia Evaluation  Patient identified by MRN, date of birth, ID band Patient awake    Reviewed: Allergy & Precautions, NPO status , Patient's Chart, lab work & pertinent test results  Airway Mallampati: II  TM Distance: >3 FB Neck ROM: Full    Dental no notable dental hx.    Pulmonary Current Smoker and Patient abstained from smoking.   Pulmonary exam normal        Cardiovascular hypertension, Pt. on medications  Rhythm:Regular Rate:Normal     Neuro/Psych negative neurological ROS  negative psych ROS   GI/Hepatic negative GI ROS, Neg liver ROS,,,  Endo/Other  negative endocrine ROS    Renal/GU Renal diseaseClot evacuation  negative genitourinary   Musculoskeletal negative musculoskeletal ROS (+)    Abdominal Normal abdominal exam  (+)   Peds  Hematology negative hematology ROS (+) Lab Results      Component                Value               Date                      WBC                      12.1 (H)            04/13/2023                HGB                      13.2                04/13/2023                HCT                      38.4 (L)            04/13/2023                MCV                      97.2                04/13/2023                PLT                      218                 04/13/2023              Anesthesia Other Findings   Reproductive/Obstetrics                             Anesthesia Physical Anesthesia Plan  ASA: 2 and emergent  Anesthesia Plan: General   Post-op Pain Management:    Induction: Intravenous  PONV Risk Score and Plan: 1 and Ondansetron , Dexamethasone , Treatment may vary due to age or medical condition and Midazolam   Airway Management Planned: Mask and LMA  Additional Equipment: None  Intra-op Plan:   Post-operative Plan: Extubation in OR  Informed Consent: I have reviewed the patients History and Physical, chart, labs and discussed the  procedure including the risks, benefits and alternatives for the proposed anesthesia with the patient or authorized  representative who has indicated his/her understanding and acceptance.     Dental advisory given  Plan Discussed with: CRNA  Anesthesia Plan Comments:        Anesthesia Quick Evaluation

## 2023-04-13 NOTE — Op Note (Signed)
 OPERATIVE NOTE   Patient Name: Xavier Goodwin  MRN: [id number]   Date of Procedure: 11/11/2022     Preoperative diagnosis:  Bladder outlet obstruction, gross hematuria with clot retention  Postoperative diagnosis:  Tight dense bladder neck contracture Marked radiation cystitis  Procedure:  Cystoscopy, dilation of bladder neck contracture, clot evacuation, bladder biopsy with fulguration; intraoperative fluoroscopy with real-time interpretation  Attending: Layman Lorrene Hurst, MD  Anesthesia: GETA  Estimated blood loss: Minimal  Fluids: Per anesthesia record  Drains: 20 French council tip catheter  Specimens: Bladder biopsy  Antibiotics: Rocephin  1 g IV  Findings: Tight dense bladder neck contracture, moderate amount of clot within the bladder; fairly extensive changes of radiation cystitis  Indications:  Bladder outlet obstruction, gross hematuria/clot retention  Description of Procedure:  Patient was brought to the operating room where he was correctly identified and underwent satisfactory induction of general endotracheal anesthesia while in the supine position on the operating room table.  The patient was subsequently positioned in the modified dorsolithotomy position and his external genitalia were prepped and draped in the usual sterile fashion. Preprocedural timeout was performed.  Cystourethroscopy was subsequently performed with a 22 French panendoscope.  The anterior and bulbar urethra appeared normal.  The patient is status post prior radical prostatectomy and at the bladder neck there was a tight bladder neck contracture.  I could see the outline of the bladder lumen and under direct vision using fluoroscopic control was able to place a safety wire through the scope to coil within the bladder.  With the safety wire in place I subsequently highlighted the bladder neck contracture with successive urethral dilation catheters over the safety wire with endoscopic  control.  Although very dense I was able to dilate up to 24 French.  After dilating the bladder neck I then placed the cystoscope again alongside the safety wire and was able to enter the bladder.  Upon entering the bladder there was noted be a moderate amount of clot.  I subsequently used a Toomey syringe and was able to evacuate all of the intraluminal clot.  After the clot was removed systematic examination of the bladder was performed.  The ureteral openings appeared normal.  There were noted to be fairly significant changes most consistent with radiation cystitis with tufts of abnormal friable blood vessels located predominantly on the bladder floor but also somewhat on the posterior bladder wall and anteriorly at the bladder neck.  There was actually 1 area of active oozing from the left lateral wall.  I used the cold cup biopsy forceps and obtain biopsies of 2 representative areas which were sent for pathologic analysis.  I used the Bugbee electrode to fulgurate the active bleeding site plus the biopsy sites and hemostasis was nicely obtained.  At this time there is no residual active bleeding.  Over the existing safety wire I then placed a 20 French council tip catheter with good position obtained and clear drainage.  The balloon was inflated with 10 cc of sterile water  and the procedure terminated.  The patient tolerated the procedure well.  There were no apparent complications.  Patient was subsequently extubated and taken to the recovery room in satisfactory condition.  Complications: None  Condition: Stable, extubated, transferred to PACU  Plan: FU 1-2 weeks for cath removal

## 2023-04-13 NOTE — ED Provider Triage Note (Signed)
 Emergency Medicine Provider Triage Evaluation Note  Xavier Goodwin , a 63 y.o. male  was evaluated in triage.  Pt complains of left flank pain has been going on for a few days however notes blood in his urine this morning while passing clots.  Patient states he is having decreased urine output.  Patient only has 1 kidney due to previous stones damaging his kidney.  Patient does feel similar to previous kidney stones.  Patient denies abdominal pain but states he is nauseous.  Patient denies fevers, chest pain, shortness of breath.  Patient does have history of prostate cancer as well.  Review of Systems  Positive:  Negative:   Physical Exam  BP (!) 166/108 (BP Location: Left Arm)   Pulse 95   Temp 99 F (37.2 C) (Oral)   Resp 18   Ht 6' 1 (1.854 m)   Wt 90.7 kg   SpO2 99%   BMI 26.39 kg/m  Gen:   Awake, in distress Resp:  Normal effort  MSK:   Moves extremities without difficulty  Other:  Mild CVA tenderness to left side  Medical Decision Making  Medically screening exam initiated at 2:06 PM.  Appropriate orders placed.  Xavier Goodwin was informed that the remainder of the evaluation will be completed by another provider, this initial triage assessment does not replace that evaluation, and the importance of remaining in the ED until their evaluation is complete.  Patient was walking around the room in obvious pain, patient did have mild CVA tenderness with small amounts of blood in the urine cup.  Do suspect this could be a stone as patient has history of previous.  Labs and imaging will be obtained along with medications for patient's symptoms.  Patient will be brought back immediately.   Victor Lynwood DASEN, PA-C 04/13/23 1407

## 2023-04-13 NOTE — ED Provider Notes (Signed)
 Patient taken by Urology to OR for intervention.    Wynetta Fines, MD 04/13/23 [phone]

## 2023-04-13 NOTE — Transfer of Care (Signed)
 Immediate Anesthesia Transfer of Care Note  Patient: Xavier Goodwin  Procedure(s) Performed: Procedure(s): CYSTOSCOPY WITH FULGERATION, CLOT EVACUATION, URETHRAL DILATION,BLADDER BIOPSY (N/A)  Patient Location: PACU  Anesthesia Type:General  Level of Consciousness:  sedated, patient cooperative and responds to stimulation  Airway & Oxygen Therapy:Patient Spontanous Breathing and Patient connected to face mask oxgen  Post-op Assessment:  Report given to PACU RN and Post -op Vital signs reviewed and stable  Post vital signs:  Reviewed and stable  Last Vitals:  Vitals:   04/13/23 1900 04/13/23 1905  BP:  134/82  Pulse:  89  Resp:  (!) 21  Temp: 37.1 C   SpO2:  100%    Complications: No apparent anesthesia complications

## 2023-04-14 ENCOUNTER — Encounter (HOSPITAL_COMMUNITY): Payer: Self-pay | Admitting: Urology

## 2023-04-15 LAB — SURGICAL PATHOLOGY

## 2023-04-15 NOTE — Anesthesia Postprocedure Evaluation (Signed)
 Anesthesia Post Note  Patient: Xavier Goodwin  Procedure(s) Performed: CYSTOSCOPY WITH FULGERATION, CLOT EVACUATION, URETHRAL DILATION,BLADDER BIOPSY (Bladder)     Patient location during evaluation: PACU Anesthesia Type: General Level of consciousness: awake and alert Pain management: pain level controlled Vital Signs Assessment: post-procedure vital signs reviewed and stable Respiratory status: spontaneous breathing, nonlabored ventilation, respiratory function stable and patient connected to nasal cannula oxygen Cardiovascular status: blood pressure returned to baseline and stable Postop Assessment: no apparent nausea or vomiting Anesthetic complications: no   No notable events documented.  Last Vitals:  Vitals:   04/13/23 1915 04/13/23 1930  BP: 130/78 113/77  Pulse: 87 83  Resp: (!) 25 (!) 24  Temp:    SpO2: 100% 99%    Last Pain:  Vitals:   04/13/23 1915  TempSrc:   PainSc: 0-No pain                 Cordella P Odysseus Cada

## 2023-04-16 ENCOUNTER — Other Ambulatory Visit: Payer: Self-pay | Admitting: Physician Assistant

## 2023-05-02 ENCOUNTER — Other Ambulatory Visit: Payer: Self-pay | Admitting: Physician Assistant

## 2023-05-04 ENCOUNTER — Encounter (HOSPITAL_BASED_OUTPATIENT_CLINIC_OR_DEPARTMENT_OTHER): Payer: 59 | Attending: General Surgery | Admitting: General Surgery

## 2023-05-04 DIAGNOSIS — Z992 Dependence on renal dialysis: Secondary | ICD-10-CM | POA: Diagnosis not present

## 2023-05-04 DIAGNOSIS — C61 Malignant neoplasm of prostate: Secondary | ICD-10-CM | POA: Diagnosis not present

## 2023-05-04 DIAGNOSIS — N3041 Irradiation cystitis with hematuria: Secondary | ICD-10-CM | POA: Diagnosis present

## 2023-05-05 ENCOUNTER — Encounter (HOSPITAL_COMMUNITY)
Admission: AD | Disposition: A | Discharge status: Home / Self Care | Payer: Self-pay | Source: Ambulatory Visit | Attending: Urology

## 2023-05-05 ENCOUNTER — Other Ambulatory Visit: Payer: Self-pay

## 2023-05-05 ENCOUNTER — Encounter (HOSPITAL_COMMUNITY): Payer: Self-pay | Admitting: Urology

## 2023-05-05 ENCOUNTER — Inpatient Hospital Stay (HOSPITAL_COMMUNITY): Payer: 59 | Admitting: Certified Registered Nurse Anesthetist

## 2023-05-05 ENCOUNTER — Ambulatory Visit: Payer: Self-pay | Admitting: Urology

## 2023-05-05 ENCOUNTER — Ambulatory Visit (HOSPITAL_COMMUNITY)
Admission: AD | Admit: 2023-05-05 | Discharge: 2023-05-05 | Disposition: A | Discharge status: Home / Self Care | Payer: 59 | Source: Ambulatory Visit | Attending: Urology | Admitting: Urology

## 2023-05-05 ENCOUNTER — Inpatient Hospital Stay (HOSPITAL_COMMUNITY): Payer: 59

## 2023-05-05 ENCOUNTER — Inpatient Hospital Stay (HOSPITAL_BASED_OUTPATIENT_CLINIC_OR_DEPARTMENT_OTHER): Payer: 59 | Admitting: Certified Registered Nurse Anesthetist

## 2023-05-05 DIAGNOSIS — N320 Bladder-neck obstruction: Secondary | ICD-10-CM | POA: Diagnosis not present

## 2023-05-05 DIAGNOSIS — Z87440 Personal history of urinary (tract) infections: Secondary | ICD-10-CM | POA: Insufficient documentation

## 2023-05-05 DIAGNOSIS — F17210 Nicotine dependence, cigarettes, uncomplicated: Secondary | ICD-10-CM | POA: Diagnosis not present

## 2023-05-05 DIAGNOSIS — R310 Gross hematuria: Secondary | ICD-10-CM | POA: Diagnosis not present

## 2023-05-05 DIAGNOSIS — I10 Essential (primary) hypertension: Secondary | ICD-10-CM | POA: Diagnosis not present

## 2023-05-05 DIAGNOSIS — N3041 Irradiation cystitis with hematuria: Secondary | ICD-10-CM | POA: Diagnosis not present

## 2023-05-05 DIAGNOSIS — R339 Retention of urine, unspecified: Secondary | ICD-10-CM

## 2023-05-05 DIAGNOSIS — R82994 Hypercalciuria: Secondary | ICD-10-CM | POA: Insufficient documentation

## 2023-05-05 DIAGNOSIS — Z923 Personal history of irradiation: Secondary | ICD-10-CM | POA: Diagnosis not present

## 2023-05-05 DIAGNOSIS — Z87442 Personal history of urinary calculi: Secondary | ICD-10-CM | POA: Diagnosis not present

## 2023-05-05 DIAGNOSIS — Z8546 Personal history of malignant neoplasm of prostate: Secondary | ICD-10-CM | POA: Insufficient documentation

## 2023-05-05 DIAGNOSIS — Y842 Radiological procedure and radiotherapy as the cause of abnormal reaction of the patient, or of later complication, without mention of misadventure at the time of the procedure: Secondary | ICD-10-CM | POA: Diagnosis not present

## 2023-05-05 HISTORY — PX: CYSTOSCOPY WITH FULGERATION: SHX6638

## 2023-05-05 SURGERY — CYSTOSCOPY, WITH BLADDER FULGURATION
Anesthesia: General

## 2023-05-05 SURGERY — CYSTOSCOPY, WITH URETHRAL DILATION
Anesthesia: General

## 2023-05-05 MED ORDER — ORAL CARE MOUTH RINSE: 15.0000 mL | Freq: Once | OROMUCOSAL | Status: AC

## 2023-05-05 MED ORDER — PROPOFOL 10 MG/ML IV BOLUS
INTRAVENOUS | Status: DC | PRN
  Administered 2023-05-05: 200 mg via INTRAVENOUS

## 2023-05-05 MED ORDER — ONDANSETRON HCL 4 MG/2ML IJ SOLN
INTRAMUSCULAR | Status: DC | PRN
  Administered 2023-05-05: 4 mg via INTRAVENOUS

## 2023-05-05 MED ORDER — PHENAZOPYRIDINE HCL 200 MG PO TABS: 200.0000 mg | ORAL_TABLET | Freq: Three times a day (TID) | ORAL | 0 refills | Status: AC | PRN

## 2023-05-05 MED ORDER — LACTATED RINGERS IV SOLN: INTRAVENOUS | Status: DC

## 2023-05-05 MED ORDER — DEXAMETHASONE SODIUM PHOSPHATE 4 MG/ML IJ SOLN
INTRAMUSCULAR | Status: DC | PRN
  Administered 2023-05-05: 5 mg via INTRAVENOUS

## 2023-05-05 MED ORDER — ONDANSETRON HCL 4 MG/2ML IJ SOLN
INTRAMUSCULAR | Status: AC
  Filled 2023-05-05: qty 2

## 2023-05-05 MED ORDER — LACTATED RINGERS IV SOLN: INTRAVENOUS | Status: DC | PRN

## 2023-05-05 MED ORDER — DEXAMETHASONE SODIUM PHOSPHATE 10 MG/ML IJ SOLN
INTRAMUSCULAR | Status: AC
  Filled 2023-05-05: qty 1

## 2023-05-05 MED ORDER — PROPOFOL 10 MG/ML IV BOLUS
INTRAVENOUS | Status: AC
  Filled 2023-05-05: qty 20

## 2023-05-05 MED ORDER — ROCURONIUM BROMIDE 10 MG/ML (PF) SYRINGE
PREFILLED_SYRINGE | INTRAVENOUS | Status: DC | PRN
  Administered 2023-05-05: 60 mg via INTRAVENOUS

## 2023-05-05 MED ORDER — SUCCINYLCHOLINE CHLORIDE 200 MG/10ML IV SOSY
PREFILLED_SYRINGE | INTRAVENOUS | Status: DC | PRN
  Administered 2023-05-05: 160 mg via INTRAVENOUS

## 2023-05-05 MED ORDER — FENTANYL CITRATE PF 50 MCG/ML IJ SOSY: 25.0000 ug | PREFILLED_SYRINGE | INTRAMUSCULAR | Status: DC | PRN

## 2023-05-05 MED ORDER — LIDOCAINE 2% (20 MG/ML) 5 ML SYRINGE
INTRAMUSCULAR | Status: DC | PRN
  Administered 2023-05-05: 60 mg via INTRAVENOUS

## 2023-05-05 MED ORDER — CHLORHEXIDINE GLUCONATE 0.12 % MT SOLN
15.0000 mL | Freq: Once | OROMUCOSAL | Status: AC
  Administered 2023-05-05: 15 mL via OROMUCOSAL

## 2023-05-05 MED ORDER — SODIUM CHLORIDE 0.9 % IV SOLN
2.0000 g | INTRAVENOUS | Status: AC
  Administered 2023-05-05: 2 g via INTRAVENOUS
  Filled 2023-05-05: qty 20

## 2023-05-05 MED ORDER — OXYCODONE HCL 5 MG/5ML PO SOLN: 5.0000 mg | Freq: Once | ORAL | Status: DC | PRN

## 2023-05-05 MED ORDER — MIDAZOLAM HCL 2 MG/2ML IJ SOLN
INTRAMUSCULAR | Status: AC
  Filled 2023-05-05: qty 2

## 2023-05-05 MED ORDER — SULFAMETHOXAZOLE-TRIMETHOPRIM 800-160 MG PO TABS: 1.0000 | ORAL_TABLET | Freq: Two times a day (BID) | ORAL | 0 refills | Status: AC

## 2023-05-05 MED ORDER — AMISULPRIDE (ANTIEMETIC) 5 MG/2ML IV SOLN: 10.0000 mg | Freq: Once | INTRAVENOUS | Status: DC | PRN

## 2023-05-05 MED ORDER — SUCCINYLCHOLINE CHLORIDE 200 MG/10ML IV SOSY
PREFILLED_SYRINGE | INTRAVENOUS | Status: AC
  Filled 2023-05-05: qty 10

## 2023-05-05 MED ORDER — MIDAZOLAM HCL 5 MG/5ML IJ SOLN
INTRAMUSCULAR | Status: DC | PRN
  Administered 2023-05-05: 2 mg via INTRAVENOUS

## 2023-05-05 MED ORDER — SUGAMMADEX SODIUM 200 MG/2ML IV SOLN
INTRAVENOUS | Status: DC | PRN
  Administered 2023-05-05: 300 mg via INTRAVENOUS

## 2023-05-05 MED ORDER — FENTANYL CITRATE (PF) 100 MCG/2ML IJ SOLN
INTRAMUSCULAR | Status: DC | PRN
  Administered 2023-05-05 (×2): 50 ug via INTRAVENOUS

## 2023-05-05 MED ORDER — FENTANYL CITRATE (PF) 100 MCG/2ML IJ SOLN
INTRAMUSCULAR | Status: AC
  Filled 2023-05-05: qty 2

## 2023-05-05 MED ORDER — LIDOCAINE HCL (PF) 2 % IJ SOLN
INTRAMUSCULAR | Status: AC
  Filled 2023-05-05: qty 5

## 2023-05-05 MED ORDER — OXYCODONE HCL 5 MG PO TABS: 5.0000 mg | ORAL_TABLET | Freq: Once | ORAL | Status: DC | PRN

## 2023-05-05 MED ORDER — FENTANYL CITRATE PF 50 MCG/ML IJ SOSY
PREFILLED_SYRINGE | INTRAMUSCULAR | Status: AC
  Filled 2023-05-05: qty 1

## 2023-05-05 SURGICAL SUPPLY — 17 items
BAG URINE DRAIN 2000ML AR STRL (UROLOGICAL SUPPLIES) IMPLANT
BAG URO CATCHER STRL LF (MISCELLANEOUS) ×1 IMPLANT
CATH FOLEY 2W COUNCIL 20FR 5CC (CATHETERS) IMPLANT
DRAPE FOOT SWITCH (DRAPES) ×1 IMPLANT
ELECT REM PT RETURN 15FT ADLT (MISCELLANEOUS) IMPLANT
EVACUATOR MICROVAS BLADDER (UROLOGICAL SUPPLIES) IMPLANT
GLOVE SS BIOGEL STRL SZ 8 (GLOVE) ×1 IMPLANT
GOWN STRL REUS W/ TWL XL LVL3 (GOWN DISPOSABLE) ×1 IMPLANT
KIT TURNOVER KIT A (KITS) IMPLANT
LOOP CUT BIPOLAR 24F LRG (ELECTROSURGICAL) IMPLANT
MANIFOLD NEPTUNE II (INSTRUMENTS) ×1 IMPLANT
PACK CYSTO (CUSTOM PROCEDURE TRAY) ×1 IMPLANT
PAD PREP 24X48 CUFFED NSTRL (MISCELLANEOUS) ×1 IMPLANT
SYR TOOMEY IRRIG 70ML (MISCELLANEOUS) ×1 IMPLANT
SYRINGE TOOMEY IRRIG 70ML (MISCELLANEOUS) IMPLANT
TUBING CONNECTING 10 (TUBING) ×1 IMPLANT
TUBING UROLOGY SET (TUBING) ×1 IMPLANT

## 2023-05-05 NOTE — Transfer of Care (Signed)
Immediate Anesthesia Transfer of Care Note  Patient: Xavier Goodwin  Procedure(s) Performed: CYSTOSCOPY, CLOT EVACUATION AND FULGERATION  Patient Location: PACU  Anesthesia Type:General  Level of Consciousness: awake and patient cooperative  Airway & Oxygen Therapy: Patient Spontanous Breathing and Patient connected to face mask  Post-op Assessment: Report given to RN and Post -op Vital signs reviewed and stable  Post vital signs: Reviewed and stable  Last Vitals:  Vitals Value Taken Time  BP 132/86 05/05/23 1745  Temp    Pulse 80 05/05/23 1746  Resp 22 05/05/23 1746  SpO2 100 % 05/05/23 1746  Vitals shown include unfiled device data.  Last Pain:  Vitals:   05/05/23 1645  TempSrc: Oral  PainSc: 0-No pain         Complications: No notable events documented.

## 2023-05-05 NOTE — Op Note (Signed)
OPERATIVE NOTE   Patient Name: Xavier Goodwin  MRN: [id number]   Date of Procedure: 05/05/23   Preoperative diagnosis:  Urinary retention Bladder neck contracture Gross hematuria Radiation cystitis  Postoperative diagnosis:  Urinary retention Bladder neck contracture Gross hematuria Radiation cystitis  Procedure:  Cystoscopy Evacuation of clot from bladder Fulguration of bleeding  Attending: Milderd Meager, MD  Anesthesia: General  Estimated blood loss: 50 ml  Fluids: Per anesthesia record  Drains: 36F council tip foley to gravity  Specimens: none  Antibiotics: Rocephin 1 gm IV  Findings: Dense bladder neck contracture; moderate clot within bladder; extensive changes consistent with radiation cystitis throughout the bladder  Indications:  63 year old male with history of prostate cancer status post RALP followed by radiation with radiation cystitis, gross hematuria, bladder neck contracture, and urinary retention presents for surgical management of above.  He has been unable to void since this morning.  He has passed small amounts of bloody urine.  He was evaluated in the office however a Foley catheter was unable to be placed.  Cystoscopy was unsuccessful as well.  He presents now for cystoscopy, possible dilation of bladder neck contracture, evacuation of clot from bladder, and fulguration of bleeding.  The procedure including potential risk has been discussed with the patient in detail.  He understands wished to proceed as described.  Description of Procedure:  The patient received IV Rocephin preoperatively.  He was brought to the operating room suite where he was correctly identified.  After successful induction of a general anesthetic the patient was placed in the dorsolithotomy position.  The patient's genitalia was prepped and draped in sterile fashion.  A preprocedural timeout was performed.  Cystoscopy was performed with a 21 French rigid cystoscope.   The anterior and bulbar urethra were normal.  A dense stricture was noted at the bladder neck.  A sensor guidewire was visually passed through this area at the 12 o'clock position.  Fluoroscopy confirmed that the guidewire was coiling within the bladder.  With gentle pressure, I was able to pass the cystoscope over the guidewire through the bladder neck contracture and into the bladder.  A moderate amount of clot was noted within the bladder.  This was irrigated out of the bladder using a Toomey syringe.  The bladder was inspected throughout its entirety.  Both ureteral orifices were seen and there was clear E flux bilaterally.  There were significant changes throughout the bladder consistent with radiation cystitis.  The prior biopsy site was also seen.  No active bleeding was noted.  The Bugbee electrode was used to fulgurate several areas with overlying clot that were felt to represent recent bleeding.  Excellent hemostasis was noted.  The guidewire was left in place and the cystoscope was removed.  A 20 French council tip catheter was passed over the guidewire and into the bladder.  10 mL of sterile water was placed in the balloon.  Position of the catheter was again confirmed with fluoroscopy.  The catheter irrigated easily with return of clear irrigant.  At this point the case was terminated.  The patient was extubated and taken to the postanesthesia care unit in stable condition.  Complications: None  Condition: Stable, extubated, transferred to PACU  Plan:  Discharge to home Continue Foley catheter until the bladder neck contracture can be definitively managed.

## 2023-05-05 NOTE — Anesthesia Postprocedure Evaluation (Signed)
Anesthesia Post Note  Patient: Xavier Goodwin  Procedure(s) Performed: CYSTOSCOPY, CLOT EVACUATION AND FULGERATION     Patient location during evaluation: PACU Anesthesia Type: General Level of consciousness: awake and alert Pain management: pain level controlled Vital Signs Assessment: post-procedure vital signs reviewed and stable Respiratory status: spontaneous breathing, nonlabored ventilation, respiratory function stable and patient connected to nasal cannula oxygen Cardiovascular status: blood pressure returned to baseline and stable Postop Assessment: no apparent nausea or vomiting Anesthetic complications: no  No notable events documented.  Last Vitals:  Vitals:   05/05/23 1815 05/05/23 1827  BP: (!) 143/67 (!) 117/91  Pulse: 76 81  Resp: (!) 22 16  Temp:  37.1 C  SpO2: 100% 99%    Last Pain:  Vitals:   05/05/23 1857  TempSrc:   PainSc: 6                  Kennieth Rad

## 2023-05-05 NOTE — Interval H&P Note (Signed)
History and Physical Interval Note:  05/05/2023 4:36 PM  Xavier Goodwin  has presented today for surgery, with the diagnosis of URINARY RETENTION, GROSS HEMATURIA, BLADDER NECK CONTRACTURE.  The various methods of treatment have been discussed with the patient and family. After consideration of risks, benefits and other options for treatment, the patient has consented to  Procedure(s): CYSTOSCOPY, DILATION, CLOT EVACUATION AND FULGERATION (N/A) as a surgical intervention.  The patient's history has been reviewed, patient examined, no change in status, stable for surgery.  I have reviewed the patient's chart and labs.  Questions were answered to the patient's satisfaction.     Di Kindle

## 2023-05-05 NOTE — Anesthesia Preprocedure Evaluation (Signed)
Anesthesia Evaluation  Patient identified by MRN, date of birth, ID band Patient awake    Reviewed: Allergy & Precautions, NPO status , Patient's Chart, lab work & pertinent test results  Airway Mallampati: II  TM Distance: >3 FB Neck ROM: Full    Dental no notable dental hx.    Pulmonary Current Smoker and Patient abstained from smoking.   Pulmonary exam normal        Cardiovascular hypertension, Pt. on medications  Rhythm:Regular Rate:Normal     Neuro/Psych negative neurological ROS     GI/Hepatic negative GI ROS, Neg liver ROS,,,  Endo/Other  negative endocrine ROS    Renal/GU Renal diseaseClot evacuation   Prostate CA s/p  prostatectomy    Musculoskeletal negative musculoskeletal ROS (+)    Abdominal Normal abdominal exam  (+)   Peds  Hematology negative hematology ROS (+) Lab Results      Component                Value               Date                      WBC                      12.1 (H)            04/13/2023                HGB                      13.2                04/13/2023                HCT                      38.4 (L)            04/13/2023                MCV                      97.2                04/13/2023                PLT                      218                 04/13/2023              Anesthesia Other Findings   Reproductive/Obstetrics                             Anesthesia Physical Anesthesia Plan  ASA: 2 and emergent  Anesthesia Plan: General   Post-op Pain Management:    Induction: Intravenous and Rapid sequence  PONV Risk Score and Plan: 1 and Ondansetron, Dexamethasone, Treatment may vary due to age or medical condition and Midazolam  Airway Management Planned: Oral ETT  Additional Equipment: None  Intra-op Plan:   Post-operative Plan: Extubation in OR  Informed Consent: I have reviewed the patients History and Physical, chart, labs and  discussed the procedure including the risks, benefits and alternatives for the proposed anesthesia  with the patient or authorized representative who has indicated his/her understanding and acceptance.     Dental advisory given  Plan Discussed with: CRNA  Anesthesia Plan Comments:        Anesthesia Quick Evaluation

## 2023-05-05 NOTE — H&P (Signed)
Urology Admit Note  CC: urinary retention, bladder neck contracture, gross hematuria  History of Present Illness: Xavier Goodwin is a 63 y.o. complex past urologic history well-known to our service.  Patient has a long history of recurrent nephrolithiasis status post multiple prior endoscopic procedures as well as ESWL with subsequent development of an atrophic poorly functioning right kidney.  Metabolic evaluation previously revealed hypercalciuria and he is on HCTZ.   Patient also has a history of locally advanced prostate cancer status post robotic assisted laparoscopic prostatectomy 2018.  Patient had Gleason 4+3 equal 7 with multifocal positive margins, pretreatment PSA of 13, and underwent adjuvant ADT and RT.   PSA has remained undetectable   Previous cystoscopy has demonstrated a bladder neck contracture. He presented to ED on 04/13/23 with retention.  He required cystoscopy, dilation of bladder neck contracture, clot evacuation by Dr. Margo Aye.  Cystoscopy showed a dense bladder neck contracture with changes consistent with radiation cystitis.  A 35F council foley was placed.  His foley was removed in the office on 04/22/23.    He presented to the [facility] office today with urinary retention and gross hematuria since this AM.  Attempts at placement of foley were unsuccessful.  Flexible cystoscopy was attempted but unsuccessful as well. Bladder scan showed >1 liter in bladder. He is very uncomfortable at this time.  He is able to void small amounts of bloody urine.  He presents now for surgical management with cystoscopy, dilation of bladder neck contracture, evacuation of clot from bladder, possible fulguration of bleeding.  Past Medical History:  Diagnosis Date   Chest pain    none recently since "i stopped drinking the "big gulp" of caffeine    Cholelithiasis 08/2016   History of bronchitis    History of kidney stones    History of urinary tract infection    Hypercholesteremia     Pneumonia    Prostate cancer (HCC)    Sepsis (HCC) 08/2016   Sleep apnea    does not wear cpap    Past Surgical History:  Procedure Laterality Date   CARDIAC CATHETERIZATION N/A 11/25/2015   Procedure: Left Heart Cath and Coronary Angiography;  Surgeon: Wendall Stade, MD;  Location: [facility] INVASIVE CV LAB;  Service: Cardiovascular;  Laterality: N/A;   CYSTOSCOPY W/ URETERAL STENT PLACEMENT Right 08/10/2016   Procedure: CYSTOSCOPY WITH RETROGRADE PYELOGRAM RIGHT URETERAL STENT PLACEMENT;  Surgeon: Bjorn Pippin, MD;  Location: [facility];  Service: Urology;  Laterality: Right;   CYSTOSCOPY WITH FULGERATION N/A 04/13/2023   Procedure: CYSTOSCOPY WITH FULGERATION, CLOT EVACUATION, URETHRAL DILATION,BLADDER BIOPSY;  Surgeon: Joline Maxcy, MD;  Location: [facility];  Service: Urology;  Laterality: N/A;   CYSTOSCOPY/URETEROSCOPY/HOLMIUM LASER/STENT PLACEMENT Right 03/24/2021   Procedure: CYSTOSCOPY/RETROGRADE/URETEROSCOPY/HOLMIUM LASER/STENT PLACEMENT;  Surgeon: Heloise Purpura, MD;  Location: [facility];  Service: Urology;  Laterality: Right;   EXTRACORPOREAL SHOCK WAVE LITHOTRIPSY Right 08/24/2016   Procedure: RIGHT EXTRACORPOREAL SHOCK WAVE LITHOTRIPSY (ESWL);  Surgeon: Ihor Gully, MD;  Location: [facility];  Service: Urology;  Laterality: Right;   PELVIC LYMPH NODE DISSECTION Bilateral 07/02/2016   Procedure: BILATERAL PELVIC LYMPH NODE DISSECTION;  Surgeon: Heloise Purpura, MD;  Location: [facility];  Service: Urology;  Laterality: Bilateral;   ROBOT ASSISTED LAPAROSCOPIC RADICAL PROSTATECTOMY N/A 07/02/2016   Procedure: XI ROBOTIC ASSISTED LAPAROSCOPIC RADICAL PROSTATECTOMY LEVEL 2;  Surgeon: Heloise Purpura, MD;  Location: [facility];  Service: Urology;  Laterality: N/A;    Medications: See chart  Allergies: No Known Allergies  Family History  Problem Relation Age of Onset   Hypertension Father    Heart attack Father    Diabetes Mother    Cancer Mother    Lupus Sister     Social History:  reports that he  has been smoking cigarettes. He has a 30 pack-year smoking history. He has never used smokeless tobacco. He reports that he does not drink alcohol and does not use drugs.  ROS: A complete review of systems was performed.  All systems are negative except for pertinent findings as noted.  Physical Exam:  Vital signs in last 24 hours: BP: ()/()  Arterial Line BP: ()/()  GENERAL APPEARANCE:  Well appearing, well developed, well nourished, NAD HEENT:  Atraumatic, normocephalic, oropharynx clear NECK:  Supple without lymphadenopathy or thyromegaly ABDOMEN:  Soft, non-tender, no masses EXTREMITIES:  Moves all extremities well, without clubbing, cyanosis, or edema NEUROLOGIC:  Alert and oriented x 3, normal gait, CN II-XII grossly intact MENTAL STATUS:  appropriate BACK:  Non-tender to palpation, No CVAT SKIN:  Warm, dry, and intact  Laboratory Data:  No results for input(s): "WBC", "HGB", "HCT", "PLT" in the last 72 hours.  No results for input(s): "NA", "K", "CL", "GLUCOSE", "BUN", "CALCIUM", "CREATININE" in the last 72 hours.  Invalid input(s): "CO3"   No results found for this or any previous visit (from the past 24 hours). No results found for this or any previous visit (from the past 240 hours).  Renal Function: No results for input(s): "CREATININE" in the last 168 hours. CrCl cannot be calculated (Patient's most recent lab result is older than the maximum 21 days allowed.).  Radiologic Imaging: No results found.  I independently reviewed the above imaging studies.  Impression/Recommendation Urinary retention Bladder neck contracture Gross hematuria Radiation cystitis  The patient will be scheduled for cystoscopy, dilation of bladder neck contracture, evacuation of clot,and fulguration of bleeding at [facility].  Surgical request is placed with the surgery schedulers and will be scheduled at the patient's/family request. Informed consent is given as documented  below. Anesthesia: General  The patient does not have sleep apnea, history of MRSA, history of VRE, history of cardiac device requiring special anesthetic needs. Patient is stable and considered clear for surgical in an outpatient ambulatory surgery setting as well as patient hospital setting.  Consent for Operation or Procedure: Provider Certification I hereby certify that the nature, purpose, benefits, usual and most frequent risks of, and alternatives to, the operation or procedure have been explained to the patient (or person authorized to sign for the patient) either by me as responsible physician or by the provider who is to perform the operation or procedure. Time spent such that the patient/family has had an opportunity to ask questions, and that those questions have been answered. The patient or the patient's representative has been advised that selected tasks may be performed by assistants to the primary health care provider(s). I believe that the patient (or person authorized to sign for the patient) understands what has been explained, and has consented to the operation or procedure. No guarantees were implied or made.  The patient last had PO intake at 1 PM.   I have made this a emergent case due to his significant urinary retention and need for urgent bladder decompression. Di Kindle 05/05/2023, 3:46 PM

## 2023-05-05 NOTE — Anesthesia Procedure Notes (Signed)
Procedure Name: Intubation Date/Time: 05/05/2023 5:07 PM  Performed by: Vanessa Scotland, CRNAPre-anesthesia Checklist: Emergency Drugs available, Suction available, Patient identified and Patient being monitored Patient Re-evaluated:Patient Re-evaluated prior to induction Oxygen Delivery Method: Circle system utilized Preoxygenation: Pre-oxygenation with 100% oxygen Induction Type: IV induction, Rapid sequence and Cricoid Pressure applied Laryngoscope Size: Glidescope and 4 Grade View: Grade I Tube type: Oral Tube size: 7.5 mm Number of attempts: 1 Airway Equipment and Method: Video-laryngoscopy Placement Confirmation: ETT inserted through vocal cords under direct vision, positive ETCO2 and breath sounds checked- equal and bilateral Secured at: 23 cm Tube secured with: Tape Dental Injury: Teeth and Oropharynx as per pre-operative assessment

## 2023-05-06 ENCOUNTER — Encounter (HOSPITAL_COMMUNITY): Payer: Self-pay | Admitting: Urology

## 2023-05-06 ENCOUNTER — Encounter (HOSPITAL_BASED_OUTPATIENT_CLINIC_OR_DEPARTMENT_OTHER): Payer: 59 | Admitting: General Surgery

## 2023-05-06 LAB — POCT I-STAT, CHEM 8
BUN: 13 mg/dL (ref 8–23)
Calcium, Ion: 1.19 mmol/L (ref 1.15–1.40)
Chloride: 104 mmol/L (ref 98–111)
Creatinine, Ser: 1.2 mg/dL (ref 0.61–1.24)
Glucose, Bld: 100 mg/dL — ABNORMAL HIGH (ref 70–99)
HCT: 39 % (ref 39.0–52.0)
Hemoglobin: 13.3 g/dL (ref 13.0–17.0)
Potassium: 3.3 mmol/L — ABNORMAL LOW (ref 3.5–5.1)
Sodium: 139 mmol/L (ref 135–145)
TCO2: 23 mmol/L (ref 22–32)

## 2023-05-07 ENCOUNTER — Encounter (HOSPITAL_BASED_OUTPATIENT_CLINIC_OR_DEPARTMENT_OTHER): Payer: 59 | Admitting: General Surgery

## 2023-05-10 ENCOUNTER — Encounter (HOSPITAL_BASED_OUTPATIENT_CLINIC_OR_DEPARTMENT_OTHER): Payer: 59 | Attending: General Surgery | Admitting: General Surgery

## 2023-05-10 DIAGNOSIS — Z923 Personal history of irradiation: Secondary | ICD-10-CM | POA: Diagnosis not present

## 2023-05-10 DIAGNOSIS — C61 Malignant neoplasm of prostate: Secondary | ICD-10-CM | POA: Diagnosis not present

## 2023-05-10 DIAGNOSIS — N3041 Irradiation cystitis with hematuria: Secondary | ICD-10-CM | POA: Insufficient documentation

## 2023-05-11 ENCOUNTER — Encounter (HOSPITAL_BASED_OUTPATIENT_CLINIC_OR_DEPARTMENT_OTHER): Payer: 59 | Admitting: General Surgery

## 2023-05-11 DIAGNOSIS — N3041 Irradiation cystitis with hematuria: Secondary | ICD-10-CM | POA: Diagnosis not present

## 2023-05-12 ENCOUNTER — Encounter (HOSPITAL_BASED_OUTPATIENT_CLINIC_OR_DEPARTMENT_OTHER): Payer: 59 | Admitting: General Surgery

## 2023-05-12 DIAGNOSIS — N3041 Irradiation cystitis with hematuria: Secondary | ICD-10-CM | POA: Diagnosis not present

## 2023-05-13 ENCOUNTER — Encounter (HOSPITAL_BASED_OUTPATIENT_CLINIC_OR_DEPARTMENT_OTHER): Payer: 59 | Admitting: General Surgery

## 2023-05-13 DIAGNOSIS — N3041 Irradiation cystitis with hematuria: Secondary | ICD-10-CM | POA: Diagnosis not present

## 2023-05-14 ENCOUNTER — Encounter (HOSPITAL_BASED_OUTPATIENT_CLINIC_OR_DEPARTMENT_OTHER): Payer: 59 | Admitting: General Surgery

## 2023-05-14 DIAGNOSIS — N3041 Irradiation cystitis with hematuria: Secondary | ICD-10-CM | POA: Diagnosis not present

## 2023-05-17 ENCOUNTER — Encounter (HOSPITAL_BASED_OUTPATIENT_CLINIC_OR_DEPARTMENT_OTHER): Payer: 59 | Admitting: General Surgery

## 2023-05-17 DIAGNOSIS — N3041 Irradiation cystitis with hematuria: Secondary | ICD-10-CM | POA: Diagnosis not present

## 2023-05-18 ENCOUNTER — Encounter (HOSPITAL_BASED_OUTPATIENT_CLINIC_OR_DEPARTMENT_OTHER): Payer: 59 | Admitting: General Surgery

## 2023-05-18 DIAGNOSIS — N3041 Irradiation cystitis with hematuria: Secondary | ICD-10-CM | POA: Diagnosis not present

## 2023-05-19 ENCOUNTER — Encounter (HOSPITAL_BASED_OUTPATIENT_CLINIC_OR_DEPARTMENT_OTHER): Payer: 59 | Admitting: General Surgery

## 2023-05-19 DIAGNOSIS — N3041 Irradiation cystitis with hematuria: Secondary | ICD-10-CM | POA: Diagnosis not present

## 2023-05-20 ENCOUNTER — Encounter (HOSPITAL_BASED_OUTPATIENT_CLINIC_OR_DEPARTMENT_OTHER): Payer: 59 | Admitting: General Surgery

## 2023-05-20 DIAGNOSIS — N3041 Irradiation cystitis with hematuria: Secondary | ICD-10-CM | POA: Diagnosis not present

## 2023-05-21 ENCOUNTER — Encounter (HOSPITAL_BASED_OUTPATIENT_CLINIC_OR_DEPARTMENT_OTHER): Payer: 59 | Admitting: General Surgery

## 2023-05-21 DIAGNOSIS — N3041 Irradiation cystitis with hematuria: Secondary | ICD-10-CM | POA: Diagnosis not present

## 2023-05-24 ENCOUNTER — Encounter (HOSPITAL_BASED_OUTPATIENT_CLINIC_OR_DEPARTMENT_OTHER): Payer: 59 | Admitting: General Surgery

## 2023-05-24 DIAGNOSIS — N3041 Irradiation cystitis with hematuria: Secondary | ICD-10-CM | POA: Diagnosis not present

## 2023-05-25 ENCOUNTER — Encounter (HOSPITAL_BASED_OUTPATIENT_CLINIC_OR_DEPARTMENT_OTHER): Payer: 59 | Admitting: General Surgery

## 2023-05-25 DIAGNOSIS — N3041 Irradiation cystitis with hematuria: Secondary | ICD-10-CM | POA: Diagnosis not present

## 2023-05-26 ENCOUNTER — Encounter (HOSPITAL_BASED_OUTPATIENT_CLINIC_OR_DEPARTMENT_OTHER): Payer: 59 | Admitting: General Surgery

## 2023-05-26 DIAGNOSIS — N3041 Irradiation cystitis with hematuria: Secondary | ICD-10-CM | POA: Diagnosis not present

## 2023-05-27 ENCOUNTER — Encounter (HOSPITAL_BASED_OUTPATIENT_CLINIC_OR_DEPARTMENT_OTHER): Payer: 59 | Admitting: General Surgery

## 2023-05-27 DIAGNOSIS — N3041 Irradiation cystitis with hematuria: Secondary | ICD-10-CM | POA: Diagnosis not present

## 2023-05-28 ENCOUNTER — Encounter (HOSPITAL_BASED_OUTPATIENT_CLINIC_OR_DEPARTMENT_OTHER): Payer: 59 | Admitting: General Surgery

## 2023-05-28 DIAGNOSIS — N3041 Irradiation cystitis with hematuria: Secondary | ICD-10-CM | POA: Diagnosis not present

## 2023-05-31 ENCOUNTER — Encounter (HOSPITAL_BASED_OUTPATIENT_CLINIC_OR_DEPARTMENT_OTHER): Payer: 59 | Admitting: General Surgery

## 2023-05-31 DIAGNOSIS — N3041 Irradiation cystitis with hematuria: Secondary | ICD-10-CM | POA: Diagnosis not present

## 2023-06-01 ENCOUNTER — Encounter (HOSPITAL_BASED_OUTPATIENT_CLINIC_OR_DEPARTMENT_OTHER): Payer: 59 | Admitting: General Surgery

## 2023-06-01 DIAGNOSIS — N3041 Irradiation cystitis with hematuria: Secondary | ICD-10-CM | POA: Diagnosis not present

## 2023-06-02 ENCOUNTER — Encounter (HOSPITAL_BASED_OUTPATIENT_CLINIC_OR_DEPARTMENT_OTHER): Payer: 59 | Admitting: General Surgery

## 2023-06-02 DIAGNOSIS — N3041 Irradiation cystitis with hematuria: Secondary | ICD-10-CM | POA: Diagnosis not present

## 2023-06-03 ENCOUNTER — Encounter (HOSPITAL_BASED_OUTPATIENT_CLINIC_OR_DEPARTMENT_OTHER): Payer: 59 | Admitting: General Surgery

## 2023-06-03 DIAGNOSIS — N3041 Irradiation cystitis with hematuria: Secondary | ICD-10-CM | POA: Diagnosis not present

## 2023-06-04 ENCOUNTER — Encounter (HOSPITAL_BASED_OUTPATIENT_CLINIC_OR_DEPARTMENT_OTHER): Payer: 59 | Admitting: General Surgery

## 2023-06-04 DIAGNOSIS — N3041 Irradiation cystitis with hematuria: Secondary | ICD-10-CM | POA: Diagnosis not present

## 2023-06-07 ENCOUNTER — Encounter (HOSPITAL_BASED_OUTPATIENT_CLINIC_OR_DEPARTMENT_OTHER): Payer: 59 | Attending: General Surgery | Admitting: General Surgery

## 2023-06-07 DIAGNOSIS — N3041 Irradiation cystitis with hematuria: Secondary | ICD-10-CM | POA: Diagnosis present

## 2023-06-07 DIAGNOSIS — Z923 Personal history of irradiation: Secondary | ICD-10-CM | POA: Diagnosis not present

## 2023-06-07 DIAGNOSIS — C61 Malignant neoplasm of prostate: Secondary | ICD-10-CM | POA: Insufficient documentation

## 2023-06-08 ENCOUNTER — Encounter (HOSPITAL_BASED_OUTPATIENT_CLINIC_OR_DEPARTMENT_OTHER): Payer: 59 | Admitting: General Surgery

## 2023-06-08 DIAGNOSIS — N3041 Irradiation cystitis with hematuria: Secondary | ICD-10-CM | POA: Diagnosis not present

## 2023-06-09 ENCOUNTER — Encounter (HOSPITAL_BASED_OUTPATIENT_CLINIC_OR_DEPARTMENT_OTHER): Payer: 59 | Admitting: General Surgery

## 2023-06-09 DIAGNOSIS — N3041 Irradiation cystitis with hematuria: Secondary | ICD-10-CM | POA: Diagnosis not present

## 2023-06-10 ENCOUNTER — Encounter (HOSPITAL_BASED_OUTPATIENT_CLINIC_OR_DEPARTMENT_OTHER): Admitting: Internal Medicine

## 2023-06-10 ENCOUNTER — Encounter (HOSPITAL_BASED_OUTPATIENT_CLINIC_OR_DEPARTMENT_OTHER): Payer: 59 | Admitting: Internal Medicine

## 2023-06-10 DIAGNOSIS — N3041 Irradiation cystitis with hematuria: Secondary | ICD-10-CM | POA: Diagnosis not present

## 2023-06-11 ENCOUNTER — Encounter (HOSPITAL_BASED_OUTPATIENT_CLINIC_OR_DEPARTMENT_OTHER): Admitting: General Surgery

## 2023-06-11 ENCOUNTER — Encounter (HOSPITAL_BASED_OUTPATIENT_CLINIC_OR_DEPARTMENT_OTHER): Payer: 59 | Admitting: General Surgery

## 2023-06-11 DIAGNOSIS — N3041 Irradiation cystitis with hematuria: Secondary | ICD-10-CM | POA: Diagnosis not present

## 2023-06-14 ENCOUNTER — Encounter (HOSPITAL_BASED_OUTPATIENT_CLINIC_OR_DEPARTMENT_OTHER): Payer: 59 | Admitting: General Surgery

## 2023-06-14 DIAGNOSIS — N3041 Irradiation cystitis with hematuria: Secondary | ICD-10-CM | POA: Diagnosis not present

## 2023-06-15 ENCOUNTER — Encounter (HOSPITAL_BASED_OUTPATIENT_CLINIC_OR_DEPARTMENT_OTHER): Payer: 59 | Admitting: General Surgery

## 2023-06-15 DIAGNOSIS — N3041 Irradiation cystitis with hematuria: Secondary | ICD-10-CM | POA: Diagnosis not present

## 2023-06-16 ENCOUNTER — Encounter (HOSPITAL_BASED_OUTPATIENT_CLINIC_OR_DEPARTMENT_OTHER): Payer: 59 | Admitting: Internal Medicine

## 2023-06-16 DIAGNOSIS — N3041 Irradiation cystitis with hematuria: Secondary | ICD-10-CM | POA: Diagnosis not present

## 2023-06-17 ENCOUNTER — Encounter (HOSPITAL_BASED_OUTPATIENT_CLINIC_OR_DEPARTMENT_OTHER): Payer: 59 | Admitting: Internal Medicine

## 2023-06-17 DIAGNOSIS — N3041 Irradiation cystitis with hematuria: Secondary | ICD-10-CM | POA: Diagnosis not present

## 2023-06-18 ENCOUNTER — Encounter (HOSPITAL_BASED_OUTPATIENT_CLINIC_OR_DEPARTMENT_OTHER): Payer: 59 | Admitting: Internal Medicine

## 2023-06-18 DIAGNOSIS — N3041 Irradiation cystitis with hematuria: Secondary | ICD-10-CM | POA: Diagnosis not present

## 2023-06-21 ENCOUNTER — Encounter (HOSPITAL_BASED_OUTPATIENT_CLINIC_OR_DEPARTMENT_OTHER): Payer: 59 | Admitting: Internal Medicine

## 2023-06-21 DIAGNOSIS — N3041 Irradiation cystitis with hematuria: Secondary | ICD-10-CM | POA: Diagnosis not present

## 2023-06-22 ENCOUNTER — Encounter (HOSPITAL_BASED_OUTPATIENT_CLINIC_OR_DEPARTMENT_OTHER): Payer: 59 | Admitting: General Surgery

## 2023-06-22 DIAGNOSIS — N3041 Irradiation cystitis with hematuria: Secondary | ICD-10-CM | POA: Diagnosis not present

## 2023-06-23 ENCOUNTER — Encounter (HOSPITAL_BASED_OUTPATIENT_CLINIC_OR_DEPARTMENT_OTHER): Payer: 59 | Admitting: General Surgery

## 2023-06-23 DIAGNOSIS — N3041 Irradiation cystitis with hematuria: Secondary | ICD-10-CM | POA: Diagnosis not present

## 2023-06-24 ENCOUNTER — Encounter (HOSPITAL_BASED_OUTPATIENT_CLINIC_OR_DEPARTMENT_OTHER): Payer: 59 | Admitting: General Surgery

## 2023-06-24 DIAGNOSIS — N3041 Irradiation cystitis with hematuria: Secondary | ICD-10-CM | POA: Diagnosis not present

## 2023-06-25 ENCOUNTER — Encounter (HOSPITAL_BASED_OUTPATIENT_CLINIC_OR_DEPARTMENT_OTHER): Admitting: General Surgery

## 2023-06-25 DIAGNOSIS — N3041 Irradiation cystitis with hematuria: Secondary | ICD-10-CM | POA: Diagnosis not present

## 2023-06-26 ENCOUNTER — Emergency Department (HOSPITAL_COMMUNITY)
Admission: EM | Admit: 2023-06-26 | Discharge: 2023-06-26 | Disposition: A | Discharge status: Home / Self Care | Attending: Emergency Medicine | Admitting: Emergency Medicine

## 2023-06-26 ENCOUNTER — Other Ambulatory Visit: Payer: Self-pay

## 2023-06-26 ENCOUNTER — Encounter (HOSPITAL_COMMUNITY): Payer: Self-pay

## 2023-06-26 DIAGNOSIS — Z7982 Long term (current) use of aspirin: Secondary | ICD-10-CM | POA: Insufficient documentation

## 2023-06-26 DIAGNOSIS — C61 Malignant neoplasm of prostate: Secondary | ICD-10-CM | POA: Diagnosis not present

## 2023-06-26 DIAGNOSIS — R310 Gross hematuria: Secondary | ICD-10-CM | POA: Insufficient documentation

## 2023-06-26 LAB — I-STAT CHEM 8, ED
BUN: 12 mg/dL (ref 8–23)
Calcium, Ion: 1.23 mmol/L (ref 1.15–1.40)
Chloride: 108 mmol/L (ref 98–111)
Creatinine, Ser: 1.2 mg/dL (ref 0.61–1.24)
Glucose, Bld: 109 mg/dL — ABNORMAL HIGH (ref 70–99)
HCT: 35 % — ABNORMAL LOW (ref 39.0–52.0)
Hemoglobin: 11.9 g/dL — ABNORMAL LOW (ref 13.0–17.0)
Potassium: 4 mmol/L (ref 3.5–5.1)
Sodium: 141 mmol/L (ref 135–145)
TCO2: 23 mmol/L (ref 22–32)

## 2023-06-26 MED ORDER — CEPHALEXIN 500 MG PO CAPS: 500.0000 mg | ORAL_CAPSULE | Freq: Two times a day (BID) | ORAL | 0 refills | Status: DC

## 2023-06-26 NOTE — ED Triage Notes (Signed)
 CO catheter decreased output started this morning. Clots coming out and blood. Has been passing clots on and off the entire time. Urine has been clear a few times. Currently states the clots appear to be too large to pass through foley. Leaking around urethra. 6/10 pain.

## 2023-06-26 NOTE — Consult Note (Signed)
 Urology Consult   Physician requesting consult: Rolan Bucco, MD  Reason for consult: Gross hematuria  History of Present Illness: Xavier Goodwin is a 63 y.o. male  who is currently followed by Dr. Laverle Patter with a history of grade 3 prostate cancer, s/p RALP and subsequent adjuvant radiation in 2018.  He has recently struggled with intermittent episodes of gross hematuria and clot retention secondary to radiation cystitis.  He has currently completed 7 weeks of hyperbaric oxygen therapy in hopes of ameliorating his radiation cystitis.  The patient called the office this morning reporting gross hematuria with clots and leakage around his Foley catheter.  He states that he has had intermittent episodes of gross hematuria for the past several weeks but his Foley catheter had been draining appropriately.  His wife, who is at bedside, notes that his urine has had a foul odor for the past 2 to 3 days and he denies any recent treatment for UTI.   Past Medical History:  Diagnosis Date   Chest pain    none recently since "i stopped drinking the "big gulp" of caffeine    Cholelithiasis 08/2016   History of bronchitis    History of kidney stones    History of urinary tract infection    Hypercholesteremia    Pneumonia    Prostate cancer (HCC)    Sepsis (HCC) 08/2016   Sleep apnea    does not wear cpap    Past Surgical History:  Procedure Laterality Date   CARDIAC CATHETERIZATION N/A 11/25/2015   Procedure: Left Heart Cath and Coronary Angiography;  Surgeon: Wendall Stade, MD;  Location: [facility] INVASIVE CV LAB;  Service: Cardiovascular;  Laterality: N/A;   CYSTOSCOPY W/ URETERAL STENT PLACEMENT Right 08/10/2016   Procedure: CYSTOSCOPY WITH RETROGRADE PYELOGRAM RIGHT URETERAL STENT PLACEMENT;  Surgeon: Bjorn Pippin, MD;  Location: [facility];  Service: Urology;  Laterality: Right;   CYSTOSCOPY WITH FULGERATION N/A 04/13/2023   Procedure: CYSTOSCOPY WITH FULGERATION, CLOT EVACUATION, URETHRAL DILATION,BLADDER  BIOPSY;  Surgeon: Joline Maxcy, MD;  Location: [facility];  Service: Urology;  Laterality: N/A;   CYSTOSCOPY WITH FULGERATION N/A 05/05/2023   Procedure: CYSTOSCOPY, CLOT EVACUATION AND FULGERATION;  Surgeon: Milderd Meager., MD;  Location: [facility];  Service: Urology;  Laterality: N/A;   CYSTOSCOPY/URETEROSCOPY/HOLMIUM LASER/STENT PLACEMENT Right 03/24/2021   Procedure: CYSTOSCOPY/RETROGRADE/URETEROSCOPY/HOLMIUM LASER/STENT PLACEMENT;  Surgeon: Heloise Purpura, MD;  Location: [facility];  Service: Urology;  Laterality: Right;   EXTRACORPOREAL SHOCK WAVE LITHOTRIPSY Right 08/24/2016   Procedure: RIGHT EXTRACORPOREAL SHOCK WAVE LITHOTRIPSY (ESWL);  Surgeon: Ihor Gully, MD;  Location: [facility];  Service: Urology;  Laterality: Right;   PELVIC LYMPH NODE DISSECTION Bilateral 07/02/2016   Procedure: BILATERAL PELVIC LYMPH NODE DISSECTION;  Surgeon: Heloise Purpura, MD;  Location: [facility];  Service: Urology;  Laterality: Bilateral;   ROBOT ASSISTED LAPAROSCOPIC RADICAL PROSTATECTOMY N/A 07/02/2016   Procedure: XI ROBOTIC ASSISTED LAPAROSCOPIC RADICAL PROSTATECTOMY LEVEL 2;  Surgeon: Heloise Purpura, MD;  Location: [facility];  Service: Urology;  Laterality: N/A;    Current Hospital Medications:  Home Meds:  No outpatient medications have been marked as taking for the 06/26/23 encounter [facility] Encounter).    Scheduled Meds: Continuous Infusions: PRN Meds:.  Allergies: No Known Allergies  Family History  Problem Relation Age of Onset   Hypertension Father    Heart attack Father    Diabetes Mother    Cancer Mother    Lupus Sister     Social History:  reports that he has been  smoking cigarettes. He has a 30 pack-year smoking history. He has never used smokeless tobacco. He reports that he does not drink alcohol and does not use drugs.  ROS: A complete review of systems was performed.  All systems are negative except for pertinent findings as noted.  Physical Exam:  Vital signs in last 24  hours: Temp:  [98.2 F (36.8 C)] 98.2 F (36.8 C) (03/22 1053) Pulse Rate:  [92] 92 (03/22 1053) Resp:  [16] 16 (03/22 1053) BP: (168)/(95) 168/95 (03/22 1053) SpO2:  [100 %] 100 % (03/22 1053) Weight:  [90.7 kg] 90.7 kg (03/22 1100) Constitutional:  Alert and oriented, No acute distress GU: 20 [place] council tip catheter in place and draining dark, bloody urine  Laboratory Data:  No results for input(s): "WBC", "HGB", "HCT", "PLT" in the last 72 hours.  No results for input(s): "NA", "K", "CL", "GLUCOSE", "BUN", "CALCIUM", "CREATININE" in the last 72 hours.  Invalid input(s): "CO3"   No results found for this or any previous visit (from the past 24 hours). No results found for this or any previous visit (from the past 240 hours).  Renal Function: No results for input(s): "CREATININE" in the last 168 hours. CrCl cannot be calculated (Patient's most recent lab result is older than the maximum 21 days allowed.).  Radiologic Imaging: No results found.  I independently reviewed the above imaging studies.  Impression/Recommendation 63 year old male with recurrent gross hematuria secondary to radiation cystitis following treatment for prostate cancer  -I was able to hand irrigate several small clots from his Foley catheter.  His Foley catheter was freely draining clear to light pink irrigant after the procedure -Plan to start an empiric course of cephalexin and have him follow-up in the office early next week for checkup.  Rhoderick Moody, MD [facility] 06/26/2023, 1:36 PM

## 2023-06-26 NOTE — ED Provider Notes (Signed)
 [facility] EMERGENCY DEPARTMENT AT [facility] Provider Note   CSN: [id number] Arrival date & time: 06/26/23  1048     History  Chief Complaint  Patient presents with   Foley catheter problem    Xavier Goodwin is a 63 y.o. male.  Patient is a 63 year old male who presents with gross hematuria with passing clots.  He has a history of radiation cystitis status posttreatment for prostate cancer.  He also has bladder outlet obstruction secondary to bladder neck and stricture.  He has an indwelling Foley catheter.  He had to go to the OR twice in January for placement of a Foley catheter and treatment of the hematuria/irrigation of clots.  Per notes, he has a guidewire that has been left in place for Foley exchange.  He said he had some intermittent bleeding but started bleeding heavier over the last few days.  He had some noted clots.  He felt like his urine was not draining this morning and he was noticing some pressure to his lower abdomen.  There was some leakage around his catheter.  While he was in the waiting room, he disconnected the back from the Foley and it seemed to start draining again after he reconnected it.  He no longer feels the bladder pressure.  He is currently getting hyperbaric oxygen treatment for his radiation cystitis.       Home Medications Prior to Admission medications   Medication Sig Start Date End Date Taking? Authorizing Provider  cephALEXin (KEFLEX) 500 MG capsule Take 1 capsule (500 mg total) by mouth 2 (two) times daily. 06/26/23  Yes Rolan Bucco, MD  aspirin EC 81 MG tablet Take 1 tablet (81 mg total) by mouth daily. 10/23/20   Dyann Kief, PA-C  ezetimibe (ZETIA) 10 MG tablet TAKE 1 TABLET BY MOUTH DAILY 11/11/22   Dyann Kief, PA-C  hydrochlorothiazide (HYDRODIURIL) 25 MG tablet Take 1 tablet (25 mg total) by mouth daily. 02/03/22   Dyann Kief, PA-C  phenazopyridine (PYRIDIUM) 200 MG tablet Take 1 tablet (200 mg total) by  mouth 3 (three) times daily as needed for pain. 05/05/23 05/04/24  Milderd Meager., MD  rosuvastatin (CRESTOR) 40 MG tablet TAKE 1 TABLET BY MOUTH DAILY 05/04/23   Dyann Kief, PA-C      Allergies    Patient has no known allergies.    Review of Systems   Review of Systems  Constitutional:  Negative for chills, diaphoresis, fatigue and fever.  HENT:  Negative for congestion, rhinorrhea and sneezing.   Eyes: Negative.   Respiratory:  Negative for cough, chest tightness and shortness of breath.   Cardiovascular:  Negative for chest pain and leg swelling.  Gastrointestinal:  Positive for abdominal pain (Suprapubic pressure). Negative for blood in stool, diarrhea, nausea and vomiting.  Genitourinary:  Positive for difficulty urinating and hematuria. Negative for flank pain and frequency.  Musculoskeletal:  Negative for arthralgias and back pain.  Skin:  Negative for rash.  Neurological:  Negative for dizziness, speech difficulty, weakness, numbness and headaches.    Physical Exam Updated Vital Signs BP 137/88   Pulse 86   Temp 98.2 F (36.8 C)   Resp 18   Ht 6\' 1"  (1.854 m)   Wt 90.7 kg   SpO2 100%   BMI 26.39 kg/m  Physical Exam Constitutional:      Appearance: He is well-developed.  HENT:     Head: Normocephalic and atraumatic.  Eyes:  Pupils: Pupils are equal, round, and reactive to light.  Cardiovascular:     Rate and Rhythm: Normal rate and regular rhythm.     Heart sounds: Normal heart sounds.  Pulmonary:     Effort: Pulmonary effort is normal. No respiratory distress.     Breath sounds: Normal breath sounds. No wheezing or rales.  Chest:     Chest wall: No tenderness.  Abdominal:     General: Bowel sounds are normal.     Palpations: Abdomen is soft.     Tenderness: There is no abdominal tenderness. There is no guarding or rebound.  Genitourinary:    Comments: Foley catheter in place, it is draining blood-tinged urine Musculoskeletal:        General:  Normal range of motion.     Cervical back: Normal range of motion and neck supple.  Lymphadenopathy:     Cervical: No cervical adenopathy.  Skin:    General: Skin is warm and dry.     Findings: No rash.  Neurological:     Mental Status: He is alert and oriented to person, place, and time.     ED Results / Procedures / Treatments   Labs (all labs ordered are listed, but only abnormal results are displayed) Labs Reviewed  I-STAT CHEM 8, ED - Abnormal; Notable for the following components:      Result Value   Glucose, Bld 109 (*)    Hemoglobin 11.9 (*)    HCT 35.0 (*)    All other components within normal limits    EKG None  Radiology No results found.  Procedures Procedures    Medications Ordered in ED Medications - No data to display  ED Course/ Medical Decision Making/ A&P                                 Medical Decision Making Risk Prescription drug management.   Patient is a 63 year old who presents with gross hematuria.  Sounds like he had a clogged Foley catheter with urinary retention this morning but it has resolved.  He is draining but still has bloody urine.  I discussed with Dr. Sande Brothers with urology who came in and irrigated the bladder.  It is draining well.  Per Dr. Sande Brothers, he is ready for discharge.  He recommends starting the patient on Keflex.  Urine was not collected given that it would not be a good sample at this point.  I-STAT shows a slight drop in his hemoglobin.  Discussed these findings with the patient and he will have this followed by his primary care doctor.  He was discharged home in good condition.  He has follow-up with urology already arranged.  Return precautions were given.  Final Clinical Impression(s) / ED Diagnoses Final diagnoses:  Gross hematuria    Rx / DC Orders ED Discharge Orders          Ordered    cephALEXin (KEFLEX) 500 MG capsule  2 times daily        06/26/23 1431              Rolan Bucco, MD 06/26/23  1434

## 2023-06-26 NOTE — Discharge Instructions (Addendum)
 Follow-up with your urologist as discussed.  Return to the emergency room if you have any worsening symptoms.

## 2023-06-28 ENCOUNTER — Encounter (HOSPITAL_BASED_OUTPATIENT_CLINIC_OR_DEPARTMENT_OTHER): Payer: 59 | Admitting: General Surgery

## 2023-06-28 DIAGNOSIS — C61 Malignant neoplasm of prostate: Secondary | ICD-10-CM | POA: Diagnosis not present

## 2023-06-28 DIAGNOSIS — Z923 Personal history of irradiation: Secondary | ICD-10-CM | POA: Diagnosis not present

## 2023-06-28 DIAGNOSIS — N3041 Irradiation cystitis with hematuria: Secondary | ICD-10-CM | POA: Diagnosis present

## 2023-06-29 ENCOUNTER — Encounter (HOSPITAL_BASED_OUTPATIENT_CLINIC_OR_DEPARTMENT_OTHER): Payer: 59 | Admitting: General Surgery

## 2023-06-29 DIAGNOSIS — N3041 Irradiation cystitis with hematuria: Secondary | ICD-10-CM | POA: Diagnosis not present

## 2023-06-30 ENCOUNTER — Encounter (HOSPITAL_BASED_OUTPATIENT_CLINIC_OR_DEPARTMENT_OTHER): Payer: 59 | Admitting: General Surgery

## 2023-06-30 DIAGNOSIS — N3041 Irradiation cystitis with hematuria: Secondary | ICD-10-CM | POA: Diagnosis not present

## 2023-07-01 ENCOUNTER — Encounter (HOSPITAL_BASED_OUTPATIENT_CLINIC_OR_DEPARTMENT_OTHER): Payer: 59 | Admitting: General Surgery

## 2023-07-01 DIAGNOSIS — N3041 Irradiation cystitis with hematuria: Secondary | ICD-10-CM | POA: Diagnosis not present

## 2023-07-02 ENCOUNTER — Encounter (HOSPITAL_BASED_OUTPATIENT_CLINIC_OR_DEPARTMENT_OTHER): Payer: 59 | Admitting: General Surgery

## 2023-07-02 DIAGNOSIS — N3041 Irradiation cystitis with hematuria: Secondary | ICD-10-CM | POA: Diagnosis not present

## 2023-07-29 ENCOUNTER — Other Ambulatory Visit: Payer: Self-pay | Admitting: Urology

## 2023-08-05 ENCOUNTER — Encounter: Payer: Self-pay | Admitting: Sports Medicine

## 2023-08-05 ENCOUNTER — Other Ambulatory Visit: Payer: Self-pay | Admitting: Family Medicine

## 2023-08-05 DIAGNOSIS — R519 Headache, unspecified: Secondary | ICD-10-CM

## 2023-08-09 ENCOUNTER — Other Ambulatory Visit: Payer: Self-pay | Admitting: Sports Medicine

## 2023-08-09 DIAGNOSIS — M545 Low back pain, unspecified: Secondary | ICD-10-CM

## 2023-08-10 NOTE — Patient Instructions (Signed)
SURGICAL WAITING ROOM VISITATION  Patients having surgery or a procedure may have no more than 2 support people in the waiting area - these visitors may rotate.    Children under the age of 72 must have an adult with them who is not the patient.  Due to an increase in RSV and influenza rates and associated hospitalizations, children ages 15 and under may not visit patients in [facility] hospitals.  Visitors with respiratory illnesses are discouraged from visiting and should remain at home.  If the patient needs to stay at the hospital during part of their recovery, the visitor guidelines for inpatient rooms apply. Pre-op nurse will coordinate an appropriate time for 1 support person to accompany patient in pre-op.  This support person may not rotate.    Please refer to the [facility] website for the visitor guidelines for Inpatients (after your surgery is over and you are in a regular room).    Your procedure is scheduled on: 08/23/23   Report to [facility] Main Entrance    Report to admitting at 1:00 PM   Call this number if you have problems the morning of surgery [phone]   Do not eat food or drink liquids :After Midnight.          If you have questions, please contact your surgeon's office.   FOLLOW BOWEL PREP AND ANY ADDITIONAL PRE OP INSTRUCTIONS YOU RECEIVED FROM YOUR SURGEON'S OFFICE!!!     Oral Hygiene is also important to reduce your risk of infection.                                    Remember - BRUSH YOUR TEETH THE MORNING OF SURGERY WITH YOUR REGULAR TOOTHPASTE  DENTURES WILL BE REMOVED PRIOR TO SURGERY PLEASE DO NOT APPLY "Poly grip" OR ADHESIVES!!!   Stop all vitamins and herbal supplements 7 days before surgery.   Take these medicines the morning of surgery with A SIP OF WATER : Zetia , Rosuvastatin                                You may not have any metal on your body including jewelry, and body piercing             Do not wear lotions, powders,  cologne, or deodorant              Men may shave face and neck.   Do not bring valuables to the hospital. [facility] IS NOT             RESPONSIBLE   FOR VALUABLES.   Contacts, glasses, dentures or bridgework may not be worn into surgery.  DO NOT BRING YOUR HOME MEDICATIONS TO THE HOSPITAL. PHARMACY WILL DISPENSE MEDICATIONS LISTED ON YOUR MEDICATION LIST TO YOU DURING YOUR ADMISSION IN THE HOSPITAL!    Patients discharged on the day of surgery will not be allowed to drive home.  Someone NEEDS to stay with you for the first 24 hours after anesthesia.   Special Instructions: Bring a copy of your healthcare power of attorney and living will documents the day of surgery if you haven't scanned them before.              Please read over the following fact sheets you were given: IF YOU HAVE QUESTIONS ABOUT YOUR PRE-OP INSTRUCTIONS PLEASE CALL [phone]-  Xavier Goodwin   If you received a COVID test during your pre-op visit  it is requested that you wear a mask when out in public, stay away from anyone that may not be feeling well and notify your surgeon if you develop symptoms. If you test positive for Covid or have been in contact with anyone that has tested positive in the last 10 days please notify you surgeon.    [facility] - Preparing for Surgery Before surgery, you can play an important role.  Because skin is not sterile, your skin needs to be as free of germs as possible.  You can reduce the number of germs on your skin by washing with CHG (chlorahexidine gluconate) soap before surgery.  CHG is an antiseptic cleaner which kills germs and bonds with the skin to continue killing germs even after washing. Please DO NOT use if you have an allergy to CHG or antibacterial soaps.  If your skin becomes reddened/irritated stop using the CHG and inform your nurse when you arrive at Short Stay. Do not shave (including legs and underarms) for at least 48 hours prior to the first CHG shower.  You may shave  your face/neck.  Please follow these instructions carefully:  1.  Shower with CHG Soap the night before surgery and the  morning of surgery.  2.  If you choose to wash your hair, wash your hair first as usual with your normal  shampoo.  3.  After you shampoo, rinse your hair and body thoroughly to remove the shampoo.                             4.  Use CHG as you would any other liquid soap.  You can apply chg directly to the skin and wash.  Gently with a scrungie or clean washcloth.  5.  Apply the CHG Soap to your body ONLY FROM THE NECK DOWN.   Do   not use on face/ open                           Wound or open sores. Avoid contact with eyes, ears mouth and   genitals (private parts).                       Wash face,  Genitals (private parts) with your normal soap.             6.  Wash thoroughly, paying special attention to the area where your    surgery  will be performed.  7.  Thoroughly rinse your body with warm water  from the neck down.  8.  DO NOT shower/wash with your normal soap after using and rinsing off the CHG Soap.                9.  Pat yourself dry with a clean towel.            10.  Wear clean pajamas.            11.  Place clean sheets on your bed the night of your first shower and do not  sleep with pets. Day of Surgery : Do not apply any lotions/deodorants the morning of surgery.  Please wear clean clothes to the hospital/surgery center.  FAILURE TO FOLLOW THESE INSTRUCTIONS MAY RESULT IN THE CANCELLATION OF YOUR SURGERY  PATIENT SIGNATURE_________________________________  NURSE SIGNATURE__________________________________  ________________________________________________________________________ 

## 2023-08-10 NOTE — Progress Notes (Signed)
 COVID Vaccine Completed:  Date of COVID positive in last 90 days:  PCP - Benedetto Brady, MD Cardiologist - Janelle Mediate, MD  CT- 10/22/22 Epic Chest x-ray -  EKG - 02/09/23 Epic Stress Test - 11/07/15 Epic ECHO -  Cardiac Cath - 11/25/15 Epic Pacemaker/ICD device last checked: Spinal Cord Stimulator:  Bowel Prep -   Sleep Study -  CPAP -   Fasting Blood Sugar -  Checks Blood Sugar _____ times a day  Last dose of GLP1 agonist-  N/A GLP1 instructions:  Hold 7 days before surgery    Last dose of SGLT-2 inhibitors-  N/A SGLT-2 instructions:  Hold 3 days before surgery    Blood Thinner Instructions:  Last dose:   Time: Aspirin  Instructions: ASA 81 Last Dose:  Activity level:  Can go up a flight of stairs and perform activities of daily living without stopping and without symptoms of chest pain or shortness of breath.  Able to exercise without symptoms  Unable to go up a flight of stairs without symptoms of     Anesthesia review: Ischemic heart disease, OSA,   Patient denies shortness of breath, fever, cough and chest pain at PAT appointment  Patient verbalized understanding of instructions that were given to them at the PAT appointment. Patient was also instructed that they will need to review over the PAT instructions again at home before surgery.

## 2023-08-11 ENCOUNTER — Encounter (HOSPITAL_COMMUNITY)
Admission: RE | Admit: 2023-08-11 | Discharge: 2023-08-11 | Disposition: A | Discharge status: Home / Self Care | Source: Ambulatory Visit | Attending: Urology | Admitting: Urology

## 2023-08-11 ENCOUNTER — Encounter (HOSPITAL_COMMUNITY): Payer: Self-pay

## 2023-08-11 ENCOUNTER — Other Ambulatory Visit: Payer: Self-pay

## 2023-08-11 VITALS — BP 111/77 | HR 79 | Temp 98.3°F | Resp 16 | Ht 73.0 in | Wt 213.0 lb

## 2023-08-11 DIAGNOSIS — Z01818 Encounter for other preprocedural examination: Secondary | ICD-10-CM

## 2023-08-11 DIAGNOSIS — G4733 Obstructive sleep apnea (adult) (pediatric): Secondary | ICD-10-CM | POA: Insufficient documentation

## 2023-08-11 DIAGNOSIS — Z87891 Personal history of nicotine dependence: Secondary | ICD-10-CM | POA: Insufficient documentation

## 2023-08-11 DIAGNOSIS — Z01812 Encounter for preprocedural laboratory examination: Secondary | ICD-10-CM | POA: Insufficient documentation

## 2023-08-11 DIAGNOSIS — I2510 Atherosclerotic heart disease of native coronary artery without angina pectoris: Secondary | ICD-10-CM | POA: Insufficient documentation

## 2023-08-11 DIAGNOSIS — Z8546 Personal history of malignant neoplasm of prostate: Secondary | ICD-10-CM | POA: Insufficient documentation

## 2023-08-11 DIAGNOSIS — N35919 Unspecified urethral stricture, male, unspecified site: Secondary | ICD-10-CM | POA: Diagnosis not present

## 2023-08-11 DIAGNOSIS — Z923 Personal history of irradiation: Secondary | ICD-10-CM | POA: Diagnosis not present

## 2023-08-11 DIAGNOSIS — Z9079 Acquired absence of other genital organ(s): Secondary | ICD-10-CM | POA: Diagnosis not present

## 2023-08-11 HISTORY — DX: Unspecified osteoarthritis, unspecified site: M19.90

## 2023-08-11 LAB — CBC
HCT: 30.8 % — ABNORMAL LOW (ref 39.0–52.0)
Hemoglobin: 9.9 g/dL — ABNORMAL LOW (ref 13.0–17.0)
MCH: 27.7 pg (ref 26.0–34.0)
MCHC: 32.1 g/dL (ref 30.0–36.0)
MCV: 86.3 fL (ref 80.0–100.0)
Platelets: 286 10*3/uL (ref 150–400)
RBC: 3.57 MIL/uL — ABNORMAL LOW (ref 4.22–5.81)
RDW: 17.4 % — ABNORMAL HIGH (ref 11.5–15.5)
WBC: 4.4 10*3/uL (ref 4.0–10.5)
nRBC: 0 % (ref 0.0–0.2)

## 2023-08-11 LAB — BASIC METABOLIC PANEL WITH GFR
Anion gap: 7 (ref 5–15)
BUN: 17 mg/dL (ref 8–23)
CO2: 24 mmol/L (ref 22–32)
Calcium: 9 mg/dL (ref 8.9–10.3)
Chloride: 103 mmol/L (ref 98–111)
Creatinine, Ser: 1.16 mg/dL (ref 0.61–1.24)
GFR, Estimated: >60 mL/min (ref >60)
Glucose, Bld: 92 mg/dL (ref 70–99)
Potassium: 3.4 mmol/L — ABNORMAL LOW (ref 3.5–5.1)
Sodium: 134 mmol/L — ABNORMAL LOW (ref 135–145)

## 2023-08-11 NOTE — Progress Notes (Signed)
 Hgb 9.9 results routed to Dr. Rozanne Corners

## 2023-08-11 NOTE — Progress Notes (Signed)
 Attempted to get urine sample for culture. Patient attempted to urinate but was unable to.

## 2023-08-16 ENCOUNTER — Ambulatory Visit
Admission: RE | Admit: 2023-08-16 | Discharge: 2023-08-16 | Disposition: A | Discharge status: Home / Self Care | Source: Ambulatory Visit | Attending: Family Medicine | Admitting: Family Medicine

## 2023-08-16 DIAGNOSIS — R519 Headache, unspecified: Secondary | ICD-10-CM

## 2023-08-18 ENCOUNTER — Encounter (HOSPITAL_COMMUNITY): Payer: Self-pay

## 2023-08-18 NOTE — Anesthesia Preprocedure Evaluation (Addendum)
 Anesthesia Evaluation  Patient identified by MRN, date of birth, ID band Patient awake    Reviewed: Allergy & Precautions, NPO status , Patient's Chart, lab work & pertinent test results  Airway Mallampati: I  TM Distance: >3 FB Neck ROM: Full    Dental  (+) Dental Advisory Given, Chipped,    Pulmonary sleep apnea (noncompliant with CPAP) , Patient abstained from smoking., former smoker   Pulmonary exam normal breath sounds clear to auscultation       Cardiovascular Normal cardiovascular exam Rhythm:Regular Rate:Normal  Cath 2017  Ramus lesion, 50 %stenosed.  Prox Cx lesion, 30 %stenosed.   Myovue abnormal in basal lateral wall no lesion to correspond 50% ostial small IM and 30% calcified proximal LAD Medical Rx. Should not have any flow limiting lesions to cause angina    Neuro/Psych negative neurological ROS  negative psych ROS   GI/Hepatic negative GI ROS, Neg liver ROS,,,  Endo/Other  negative endocrine ROS    Renal/GU negative Renal ROS  negative genitourinary   Musculoskeletal  (+) Arthritis ,    Abdominal   Peds  Hematology  (+) Blood dyscrasia, anemia Lab Results      Component                Value               Date                      WBC                      4.4                 08/11/2023                HGB                      9.9 (L)             08/11/2023                HCT                      30.8 (L)            08/11/2023                MCV                      86.3                08/11/2023                PLT                      286                 08/11/2023              Anesthesia Other Findings Past medical history significant for former smoking, nonobstructive CAD (by cath in 2017) OSA (no CPAP use), prostate cancer s/p prostatectomy and XRT (2018), radiation cystitis, arthritis, PTSD  Reproductive/Obstetrics                             Anesthesia  Physical Anesthesia Plan  ASA: 3  Anesthesia Plan: General   Post-op Pain Management: Tylenol   PO (pre-op)*   Induction: Intravenous  PONV Risk Score and Plan: 2 and Ondansetron , Dexamethasone  and Midazolam   Airway Management Planned: LMA  Additional Equipment:   Intra-op Plan:   Post-operative Plan: Extubation in OR  Informed Consent: I have reviewed the patients History and Physical, chart, labs and discussed the procedure including the risks, benefits and alternatives for the proposed anesthesia with the patient or authorized representative who has indicated his/her understanding and acceptance.     Dental advisory given  Plan Discussed with: CRNA  Anesthesia Plan Comments: (See PAT note from 5/7)        Anesthesia Quick Evaluation

## 2023-08-18 NOTE — Progress Notes (Addendum)
 Case: [id number] Date/Time: 08/23/23 1500   Procedure: CYSTOSCOPY, WITH URETHRAL DILATION - OPTILUME BALLOON DILATION   Anesthesia type: General   Diagnosis: Stricture of male urethra, unspecified stricture type [N35.919]   Pre-op diagnosis: URETHRAL STRICTURE   Location: [facility] ROOM 03 / [facility]   Surgeons: Florencio Hunting, MD       DISCUSSION: Xavier Goodwin is a 63 year old male who presents to PAT prior to surgery above.  Past medical history significant for former smoking, nonobstructive CAD (by cath in 2017) OSA (no CPAP use), prostate cancer s/p prostatectomy and XRT (2018), radiation cystitis, arthritis, PTSD.  Patient has been having issues with gross hematuria due to radiation cystitis after treatment of his prostate cancer.  He goes to the wound care center for hyperbaric treatments.  Patient follows with cardiology for history of CAD.  Last seen on 02/09/2023.  Is on medical therapy which she is tolerating.  Advised to follow-up in 1 year.  Patient followed by PCP.  Last seen on 07/22/2023.  PCP aware of his anemia and believes its likely related to hematuria. MRI brain ordered for headaches which was unremarkable.   VS: BP 111/77   Pulse 79   Temp 36.8 C (Oral)   Resp 16   Ht 6\' 1"  (1.854 m)   Wt 96.6 kg   SpO2 100%   BMI 28.10 kg/m   PROVIDERS: Benedetto Brady, MD   LABS: Labs reviewed: Acceptable for surgery.  PCP aware of anemia which is stable from prior CBC on 4/15  (all labs ordered are listed, but only abnormal results are displayed)  Labs Reviewed  BASIC METABOLIC PANEL WITH GFR - Abnormal; Notable for the following components:      Result Value   Sodium 134 (*)    Potassium 3.4 (*)    All other components within normal limits  CBC - Abnormal; Notable for the following components:   RBC 3.57 (*)    Hemoglobin 9.9 (*)    HCT 30.8 (*)    RDW 17.4 (*)    All other components within normal limits     IMAGES: MRI brain 08/16/2023: IMPRESSION:   MRI of the  brain is normal for age.  EKG 02/09/2023  Normal sinus rhythm, rate 75  CV:  Left heart cath 11/25/2015:  Ramus lesion, 50 %stenosed. Prox Cx lesion, 30 %stenosed.   Myovue abnormal in basal lateral wall no lesion to correspond 50% ostial small IM and 30% calcified proximal LAD [employer]. Should not have any flow limiting lesions to cause angina  Stress test 8/3//2017:  Nuclear stress EF: 48%. There was no ST segment deviation noted during stress. Findings consistent with ischemia. This is an intermediate risk study. The left ventricular ejection fraction is mildly decreased (45-54%).   Moderate sized area of moderate ischemia in the mid and basal lateral  Wall. EF 48%    CT calcium  score 10/16/2015 FINDINGS: Non-cardiac: No significant non cardiac findings on limited lung and soft tissue windows. See separate report from [facility]. Ascending Aorta:  3.6 cm Pericardium: Normal Coronary arteries: Calcium  noted in distal LM, LAD RCA and distal circumflex IMPRESSION: Coronary calcium  score of 266. This was 81st percentile for age and sex matched control.  Past Medical History:  Diagnosis Date   Arthritis    in hands   Chest pain    none recently since "i stopped drinking the "big gulp" of caffeine    Cholelithiasis 08/2016   History of bronchitis    History  of kidney stones    History of urinary tract infection    Hypercholesteremia    Pneumonia    Prostate cancer (HCC)    Sepsis (HCC) 08/2016   Sleep apnea    does not wear cpap    Past Surgical History:  Procedure Laterality Date   CARDIAC CATHETERIZATION N/A 11/25/2015   Procedure: Left Heart Cath and Coronary Angiography;  Surgeon: Loyde Rule, MD;  Location: [facility] INVASIVE CV LAB;  Service: Cardiovascular;  Laterality: N/A;   CYSTOSCOPY W/ URETERAL STENT PLACEMENT Right 08/10/2016   Procedure: CYSTOSCOPY WITH RETROGRADE PYELOGRAM RIGHT URETERAL STENT PLACEMENT;  Surgeon: Homero Luster, MD;   Location[facility];  Service: Urology;  Laterality: Right;   CYSTOSCOPY WITH FULGERATION N/A 04/13/2023   Procedure: CYSTOSCOPY WITH FULGERATION, CLOT EVACUATION, URETHRAL DILATION,BLADDER BIOPSY;  Surgeon: Scarlet Curly, MD;  Location[facility];  Service: Urology;  Laterality: N/A;   CYSTOSCOPY WITH FULGERATION N/A 05/05/2023   Procedure: CYSTOSCOPY, CLOT EVACUATION AND FULGERATION;  Surgeon: Mellie Sprinkle., MD;  Location[facility];  Service: Urology;  Laterality: N/A;   CYSTOSCOPY/URETEROSCOPY/HOLMIUM LASER/STENT PLACEMENT Right 03/24/2021   Procedure: CYSTOSCOPY/RETROGRADE/URETEROSCOPY/HOLMIUM LASER/STENT PLACEMENT;  Surgeon: Florencio Hunting, MD;  Location[facility];  Service: Urology;  Laterality: Right;   EXTRACORPOREAL SHOCK WAVE LITHOTRIPSY Right 08/24/2016   Procedure: RIGHT EXTRACORPOREAL SHOCK WAVE LITHOTRIPSY (ESWL);  Surgeon: Ottelin, Mark, MD;  Location[facility];  Service: Urology;  Laterality: Right;   PELVIC LYMPH NODE DISSECTION Bilateral 07/02/2016   Procedure: BILATERAL PELVIC LYMPH NODE DISSECTION;  Surgeon: Florencio Hunting, MD;  Location[facility];  Service: Urology;  Laterality: Bilateral;   ROBOT ASSISTED LAPAROSCOPIC RADICAL PROSTATECTOMY N/A 07/02/2016   Procedure: XI ROBOTIC ASSISTED LAPAROSCOPIC RADICAL PROSTATECTOMY LEVEL 2;  Surgeon: Florencio Hunting, MD;  Location[facility];  Service: Urology;  Laterality: N/A;    MEDICATIONS:  aspirin  EC 81 MG tablet   ezetimibe  (ZETIA ) 10 MG tablet   hydrochlorothiazide  (HYDRODIURIL ) 25 MG tablet   phenazopyridine  (PYRIDIUM ) 200 MG tablet   rosuvastatin  (CRESTOR ) 40 MG tablet   No current facility-administered medications for this encounter.    Antoinette Kirschner [facility] Short Stay/Anesthesiology [facility] Phone [phone] 08/18/2023 9:46 AM

## 2023-08-23 ENCOUNTER — Other Ambulatory Visit: Payer: Self-pay

## 2023-08-23 ENCOUNTER — Ambulatory Visit (HOSPITAL_BASED_OUTPATIENT_CLINIC_OR_DEPARTMENT_OTHER): Admitting: Anesthesiology

## 2023-08-23 ENCOUNTER — Ambulatory Visit (HOSPITAL_COMMUNITY): Payer: Self-pay | Admitting: Medical

## 2023-08-23 ENCOUNTER — Encounter (HOSPITAL_COMMUNITY)
Admission: RE | Disposition: A | Discharge status: Home / Self Care | Payer: Self-pay | Source: Ambulatory Visit | Attending: Urology

## 2023-08-23 ENCOUNTER — Ambulatory Visit (HOSPITAL_COMMUNITY)
Admission: RE | Admit: 2023-08-23 | Discharge: 2023-08-23 | Disposition: A | Discharge status: Home / Self Care | Source: Ambulatory Visit | Attending: Urology | Admitting: Urology

## 2023-08-23 ENCOUNTER — Ambulatory Visit (HOSPITAL_COMMUNITY)

## 2023-08-23 ENCOUNTER — Encounter (HOSPITAL_COMMUNITY): Payer: Self-pay | Admitting: Urology

## 2023-08-23 DIAGNOSIS — Z87891 Personal history of nicotine dependence: Secondary | ICD-10-CM | POA: Insufficient documentation

## 2023-08-23 DIAGNOSIS — G4730 Sleep apnea, unspecified: Secondary | ICD-10-CM | POA: Diagnosis not present

## 2023-08-23 DIAGNOSIS — N35919 Unspecified urethral stricture, male, unspecified site: Secondary | ICD-10-CM | POA: Insufficient documentation

## 2023-08-23 DIAGNOSIS — E7800 Pure hypercholesterolemia, unspecified: Secondary | ICD-10-CM | POA: Diagnosis not present

## 2023-08-23 HISTORY — PX: CYSTOSCOPY WITH URETHRAL DILATATION: SHX5125

## 2023-08-23 SURGERY — CYSTOSCOPY, WITH URETHRAL DILATION
Anesthesia: General | Site: Bladder

## 2023-08-23 MED ORDER — FENTANYL CITRATE (PF) 100 MCG/2ML IJ SOLN
INTRAMUSCULAR | Status: DC | PRN
Start: 2023-08-23 — End: 2023-08-23
  Administered 2023-08-23 (×2): 50 ug via INTRAVENOUS

## 2023-08-23 MED ORDER — FENTANYL CITRATE (PF) 100 MCG/2ML IJ SOLN
INTRAMUSCULAR | Status: AC
  Filled 2023-08-23: qty 2

## 2023-08-23 MED ORDER — DEXAMETHASONE SODIUM PHOSPHATE 4 MG/ML IJ SOLN: INTRAMUSCULAR | Status: DC | PRN

## 2023-08-23 MED ORDER — PHENYLEPHRINE 80 MCG/ML (10ML) SYRINGE FOR IV PUSH (FOR BLOOD PRESSURE SUPPORT)
PREFILLED_SYRINGE | INTRAVENOUS | Status: DC | PRN
  Administered 2023-08-23 (×3): 160 ug via INTRAVENOUS

## 2023-08-23 MED ORDER — KETOROLAC TROMETHAMINE 30 MG/ML IJ SOLN
INTRAMUSCULAR | Status: DC | PRN
  Administered 2023-08-23: 30 mg via INTRAVENOUS

## 2023-08-23 MED ORDER — LACTATED RINGERS IV SOLN: INTRAVENOUS | Status: DC | PRN

## 2023-08-23 MED ORDER — FENTANYL CITRATE PF 50 MCG/ML IJ SOSY: 25.0000 ug | PREFILLED_SYRINGE | INTRAMUSCULAR | Status: DC | PRN

## 2023-08-23 MED ORDER — MIDAZOLAM HCL 5 MG/5ML IJ SOLN
INTRAMUSCULAR | Status: DC | PRN
  Administered 2023-08-23: 2 mg via INTRAVENOUS

## 2023-08-23 MED ORDER — ACETAMINOPHEN 500 MG PO TABS
1000.0000 mg | ORAL_TABLET | Freq: Once | ORAL | Status: AC
  Administered 2023-08-23: 1000 mg via ORAL
  Filled 2023-08-23: qty 2

## 2023-08-23 MED ORDER — KETOROLAC TROMETHAMINE 30 MG/ML IJ SOLN
INTRAMUSCULAR | Status: AC
  Filled 2023-08-23: qty 1

## 2023-08-23 MED ORDER — OXYCODONE HCL 5 MG/5ML PO SOLN: 5.0000 mg | Freq: Once | ORAL | Status: DC | PRN

## 2023-08-23 MED ORDER — LIDOCAINE HCL (PF) 2 % IJ SOLN
INTRAMUSCULAR | Status: DC | PRN
  Administered 2023-08-23: 100 mg via INTRADERMAL

## 2023-08-23 MED ORDER — SODIUM CHLORIDE 0.9 % IR SOLN
Status: DC | PRN
  Administered 2023-08-23: 3000 mL

## 2023-08-23 MED ORDER — ONDANSETRON HCL 4 MG/2ML IJ SOLN
INTRAMUSCULAR | Status: AC
  Filled 2023-08-23: qty 2

## 2023-08-23 MED ORDER — DEXAMETHASONE SODIUM PHOSPHATE 10 MG/ML IJ SOLN
INTRAMUSCULAR | Status: DC | PRN
  Administered 2023-08-23: 8 mg via INTRAVENOUS

## 2023-08-23 MED ORDER — LIDOCAINE HCL (PF) 2 % IJ SOLN
INTRAMUSCULAR | Status: AC
  Filled 2023-08-23: qty 5

## 2023-08-23 MED ORDER — CHLORHEXIDINE GLUCONATE 0.12 % MT SOLN
15.0000 mL | Freq: Once | OROMUCOSAL | Status: AC
  Administered 2023-08-23: 15 mL via OROMUCOSAL

## 2023-08-23 MED ORDER — PROPOFOL 10 MG/ML IV BOLUS
INTRAVENOUS | Status: DC | PRN
  Administered 2023-08-23: 150 mg via INTRAVENOUS

## 2023-08-23 MED ORDER — ONDANSETRON HCL 4 MG/2ML IJ SOLN
INTRAMUSCULAR | Status: DC | PRN
  Administered 2023-08-23: 4 mg via INTRAVENOUS

## 2023-08-23 MED ORDER — LIDOCAINE 2% (20 MG/ML) 5 ML SYRINGE: INTRAMUSCULAR | Status: DC | PRN

## 2023-08-23 MED ORDER — ORAL CARE MOUTH RINSE: 15.0000 mL | Freq: Once | OROMUCOSAL | Status: AC

## 2023-08-23 MED ORDER — DEXAMETHASONE SODIUM PHOSPHATE 10 MG/ML IJ SOLN
INTRAMUSCULAR | Status: AC
  Filled 2023-08-23: qty 1

## 2023-08-23 MED ORDER — MIDAZOLAM HCL 2 MG/2ML IJ SOLN
INTRAMUSCULAR | Status: AC
  Filled 2023-08-23: qty 2

## 2023-08-23 MED ORDER — PROPOFOL 10 MG/ML IV BOLUS
INTRAVENOUS | Status: AC
  Filled 2023-08-23: qty 20

## 2023-08-23 MED ORDER — STERILE WATER FOR IRRIGATION IR SOLN
Status: DC | PRN
  Administered 2023-08-23: 1000 mL

## 2023-08-23 MED ORDER — IOHEXOL 300 MG/ML  SOLN
INTRAMUSCULAR | Status: DC | PRN
  Administered 2023-08-23: 10 mL

## 2023-08-23 MED ORDER — LACTATED RINGERS IV SOLN: INTRAVENOUS | Status: DC

## 2023-08-23 MED ORDER — SODIUM CHLORIDE 0.9 % IV SOLN
2.0000 g | INTRAVENOUS | Status: AC
  Administered 2023-08-23: 2 g via INTRAVENOUS
  Filled 2023-08-23: qty 20

## 2023-08-23 MED ORDER — OXYCODONE HCL 5 MG PO TABS: 5.0000 mg | ORAL_TABLET | Freq: Once | ORAL | Status: DC | PRN

## 2023-08-23 MED ORDER — AMISULPRIDE (ANTIEMETIC) 5 MG/2ML IV SOLN: 10.0000 mg | Freq: Once | INTRAVENOUS | Status: DC | PRN

## 2023-08-23 SURGICAL SUPPLY — 22 items
BAG URINE DRAIN 2000ML AR STRL (UROLOGICAL SUPPLIES) ×1 IMPLANT
BAG URINE LEG 500ML (DRAIN) IMPLANT
BALLOON NEPHROSTOMY (BALLOONS) IMPLANT
BALLOON OPTILUME DCB 30X5X75 (BALLOONS) IMPLANT
CATH FOLEY 2W COUNCIL 20FR 5CC (CATHETERS) IMPLANT
CATH ROBINSON RED A/P 14FR (CATHETERS) IMPLANT
CATH URET 5FR 70CM CONE TIP (BALLOONS) IMPLANT
CATH URETL OPEN END 6FR 70 (CATHETERS) IMPLANT
CLOTH BEACON ORANGE TIMEOUT ST (SAFETY) ×1 IMPLANT
DEVICE INFLATION ATRION QL4015 (MISCELLANEOUS) IMPLANT
GLOVE BIO SURGEON STRL SZ7.5 (GLOVE) ×1 IMPLANT
GOWN STRL REUS W/ TWL XL LVL3 (GOWN DISPOSABLE) ×1 IMPLANT
GUIDEWIRE ANG ZIPWIRE 038X150 (WIRE) IMPLANT
GUIDEWIRE STR DUAL SENSOR (WIRE) IMPLANT
KIT TURNOVER KIT A (KITS) IMPLANT
MANIFOLD NEPTUNE II (INSTRUMENTS) IMPLANT
NS IRRIG 1000ML POUR BTL (IV SOLUTION) IMPLANT
PACK CYSTO (CUSTOM PROCEDURE TRAY) ×1 IMPLANT
PENCIL SMOKE EVACUATOR (MISCELLANEOUS) IMPLANT
SYRINGE TOOMEY IRRIG 70ML (MISCELLANEOUS) IMPLANT
TUBING CONNECTING 10 (TUBING) ×1 IMPLANT
WATER STERILE IRR 3000ML UROMA (IV SOLUTION) ×1 IMPLANT

## 2023-08-23 NOTE — Transfer of Care (Signed)
 Immediate Anesthesia Transfer of Care Note  Patient: Xavier Goodwin  Procedure(s) Performed: CYSTOSCOPY, WITH URETHRAL DILATION (Bladder)  Patient Location: PACU  Anesthesia Type:General  Level of Consciousness: awake and alert   Airway & Oxygen Therapy: Patient Spontanous Breathing and Patient connected to face mask oxygen  Post-op Assessment: Report given to RN and Post -op Vital signs reviewed and stable  Post vital signs: Reviewed and stable  Last Vitals:  Vitals Value Taken Time  BP 133/89 08/23/23 1520  Temp 36.7 C 08/23/23 1520  Pulse 71 08/23/23 1521  Resp 16 08/23/23 1521  SpO2 100 % 08/23/23 1521  Vitals shown include unfiled device data.  Last Pain:  Vitals:   08/23/23 1311  TempSrc: Oral  PainSc:          Complications: No notable events documented.

## 2023-08-23 NOTE — Interval H&P Note (Signed)
 History and Physical Interval Note:  08/23/2023 2:07 PM  Xavier Goodwin  has presented today for surgery, with the diagnosis of URETHRAL STRICTURE.  The various methods of treatment have been discussed with the patient and family. After consideration of risks, benefits and other options for treatment, the patient has consented to  Procedure(s) with comments: CYSTOSCOPY, WITH URETHRAL DILATION (N/A) - OPTILUME BALLOON DILATION as a surgical intervention.  The patient's history has been reviewed, patient examined, no change in status, stable for surgery.  I have reviewed the patient's chart and labs.  Questions were answered to the patient's satisfaction.     [employer]

## 2023-08-23 NOTE — Op Note (Signed)
 Preoperative diagnosis: Urethral stricture  Postoperative diagnosis: Urethral stricture  Procedure: Cystoscopy with Optilume balloon dilation of urethral stricture  Surgeon: Izetta Marshall MD  Resident: Dr. Alphonza Ashing  Anesthesia: General  Complications: None  EBL: Minimal  Intraoperative findings: He was noted to have an area of urethral stricture from the membranous urethra back to the bladder neck.  He is status post prostatectomy and radiation therapy.  This area measured approximately 2 cm.  Indication: Xavier Goodwin is a 63 year old gentleman with a history of recurrent urethral stricture/bladder neck stenosis following radical prostatectomy and salvage radiation therapy.  He recently developed recurrent urinary retention due to recurrent stricture disease.  He was able to have a catheter placed acutely and returns today for more definitive management.  The potential risks, complications, and the expected recovery process was discussed in detail.  Informed consent was obtained.  Description of procedure: The patient was taken to the operating room and a general anesthetic was administered.  He was given preoperative antibiotics, placed in the dorsolithotomy position, prepped and draped in the usual sterile fashion.  A preoperative timeout was performed.  Cystourethroscopy was performed with a 22 French rigid cystoscope.  Examination of the previously dilated strictured portion of the urethra was examined extending from the membranous urethra back to the bladder neck.  There were no obvious abnormalities within the bladder.  No active bleeding sites were noted within the bladder.  A 0.38 sensor guidewire was then advanced into the bladder under fluoroscopic guidance.  The 30 French Optilume balloon was then placed over the wire and positioned under fluoroscopy and with the aid of direct vision as the rigid cystoscope was advanced next to the wire to evaluate the distal extent of the  wire.  Once appropriately positioned, the wire was inflated to 15 atm of pressure for 5 minutes.  The balloon was then deflated and removed.  A 20 French Foley catheter was inserted.  The patient tolerated the procedure well without complications.  He was able to be awakened and transferred to the recovery unit in satisfactory condition.

## 2023-08-23 NOTE — Discharge Instructions (Addendum)
You may see some blood in the urine and may have some burning with urination for 48-72 hours. You also may notice that you have to urinate more frequently or urgently after your procedure which is normal.  You should call should you develop an inability urinate, fever > 101, persistent nausea and vomiting that prevents you from eating or drinking to stay hydrated.  If you have a catheter, you will be taught how to take care of the catheter by the nursing staff prior to discharge from the hospital.  You may periodically feel a strong urge to void with the catheter in place.  This is a bladder spasm and most often can occur when having a bowel movement or moving around. It is typically self-limited and usually will stop after a few minutes.  You may use some Vaseline or Neosporin around the tip of the catheter to reduce friction at the tip of the penis. You may also see some blood in the urine.  A very small amount of blood can make the urine look quite red.  As long as the catheter is draining well, there usually is not a problem.  However, if the catheter is not draining well and is bloody, you should call the office ([phone]) to notify us.  

## 2023-08-23 NOTE — H&P (Signed)
 Office Visit Report     08/11/2023   --------------------------------------------------------------------------------   Xavier Goodwin. Tatlock  MRN: [id number]  DOB: 08-25-1960, 63 year old Male  SSN: [id number]   PRIMARY CARE:  L Norvel Beer, MD  PRIMARY CARE FAX:  [phone]  REFERRING:  Gordon Latus, MD  PROVIDER:  Florencio Hunting, M.D.  TREATING:  Williemae Harsh, NP  LOCATION:  [facility] [phone]     --------------------------------------------------------------------------------   CC/HPI: 07/21/2023: See past office visit and hospital notes for details regarding his complex HPI. Last seen here by Dr. Ralene Burger on 4/1, he had a successful trial of void at that time. Presents acutely this morning with painful inability to void. He was voiding close to his baseline yesterday but retention symptoms ramped up during the overnight. He has not seen any recent gross hematuria, denies dysuria. PVR greater than 500 mL this morning.   07/28/2023: Despite some resistance, I was able to place coud tip catheter at last visit draining his bladder. There was no gross hematuria and he has not had a recurrence of such over the past week. Urine output appropriate by his report. He did have a concurrent positive urine culture at last exam, he was prescribed culture sensitive antibiotics in the form of doxycycline. Returns today for trial of void.   08/11/2023: Seen acutely today. He has had recurrence of painful inability to void beginning this morning. Yesterday he was having a little more increased frequency from baseline but was still able to void without issue. Current symptoms not associated with dysuria or gross hematuria. PVR greater than 600 mL. Of note he is scheduled for cystoscopy with Optilume balloon dilation of bladder neck contracture on 5/19 with Dr. Rozanne Corners. This was discussed in detail at time of his last exam.     ALLERGIES: No Known Allergies    MEDICATIONS:  GoodSense Aspirin  81 MG Tablet Chewable  hydroCHLOROthiazide  25 MG Tablet 1 tablet PO Daily  Rosuvastatin  Calcium  20 MG Tablet     GU PSH: Cysto Remove Stent FB Sim - 2023, 2018 Cystoscopy - 06/01/2023 Cystoscopy Insert Stent, Right - 2018 ESWL - 2018, Evans - 2006 Insert Bladder Cath; Complex - 07/21/2023 Laparoscopy; Lymphadenectomy - 2018 Locm 300-399Mg /Ml Iodine,1Ml - 03/14/2021, 2018 Prostate Needle Biopsy - 2018 Robotic Radical Prostatectomy - 2018 Ureteroscopic laser litho - 03/24/2021     NON-GU PSH: Colonoscopy - 2016 Elbow Surgery (Unspecified), Left - 1976 Surgical Pathology, Gross And Microscopic Examination For Prostate Needle - 2018 Visit Complexity (formerly GPC1X) - 07/06/2023, 05/05/2023, 04/27/2023, 04/22/2023     GU PMH: Urinary Retention - 07/28/2023, - 07/21/2023, - 06/01/2023, - 05/11/2023, - 05/05/2023, - 04/22/2023 Radiation cystitis (with hematuria) - 07/06/2023, - 06/01/2023, - 05/11/2023, - 05/05/2023, - 04/22/2023 Gross hematuria - 05/05/2023, - 04/27/2023 Prostate Cancer - 04/22/2023, - 09/09/2022, - 09/05/2021, - 03/04/2021, - 2022, - 2018, - 2018 ED due to arterial insufficiency - 10/29/2022, - 09/09/2022, - 03/04/2021, - 2022, - 2018 History of urolithiasis - 09/09/2022, - 09/05/2021 Hydronephrosis - 09/05/2021, - 2023 Ureteral calculus - 2023, - 2023, - 03/21/2021, - 2018 Microscopic hematuria - 03/14/2021, - 03/04/2021 Renal calculus (Stable) - 2018 Stress Incontinence - 2018 Urinary Frequency - 2018 Urinary Urgency - 2018      PMH Notes:   1) Prostate cancer: He is s/p a BNS RAL radical prostatectomy and BPLND on 07/02/16. He completed salvage treatment with ADT (18 months) and pelvic/prostatic fossa radiation (Oct-Dec 2018).   Diagnosis: pT3a N0 Mx,  Gleason 4+3=7 adenocarcinoma with positive surgical margins (bilateral posterior base)  Pretreatment PSA: 13.7  Pretreatment SHIM score: 23   2) Urolithiasis/poorly functional right kidney: He presented in May 2018 with an  obstructing left ureteral stone and infection. He presented with an asymptomatic right ureteral stone in the fall of 2022 (noted by microscopic hematuria) and underwent ureteroscopic removal. He had persistent right hydronephrosis on follow up imaging and ultimately a Lasix  renogram was performed that indicated a poorly functional right kidney with only 7% relative function consistent with his right ureteral stone likely having been chronic before treatment.   Current medical treatment: HCTZ 25 mg   May 2018: Left ureteral stent for 7 mm stone and infection  May 2018: Left ESWL  Dec 2022: Right ureteroscopic laser lithotripsy (calcium  oxalate monohydrate)  Feb 2023: 24 hr urine - good urine volumes, hypercalciuria (started HCTZ 12.5 mg)  Feb 2023: CT imaging - persistent right hydronephrosis but no obstructing stone  Oct 2023: 24 hr urine - Hypercalciuria improved, hyperoxaluria (recommended increased HCTZ to 25 mg, low oxalate, low sodium diet)  Mar 2023: Laisx renogram with only 7% relative renal function  Jan 2024: 24 hr urine - lower stone risk on current medication   3) Hematuria/radiation cystitis: He was noted to have microscopic hematuria in November 2022. He developed gross hematuria with clots in January 2025 requiring catheter placement and clot evacuation complicated by a urethral stricture. Imaging and cystoscopy revealed radiation cystitis.   Dec 2022: CT imaging - multiple right ureteral calculi, Cystoscopy - unremarkable  Jan 2025: Imaging and cystoscopy - radiation cystitis  Feb-Mar 2025: Hyperbaric oxygen   4) Urethral stricture: He was incidentally noted to have a urethral stricture in January 2025 when he presented in clot retention requiring balloon dilation.     NON-GU PMH: Hypercalciuria - 09/09/2022 Hypercholesterolemia Sickle-cell trait    FAMILY HISTORY: Atrial Fibrillation - Mother Congestive Heart Failure - Mother Diabetes - Mother father deceased at age 82 -  Father Heart Attack - Father, Mother Hypertension - Mother, Father kidney disease - Mother Lung Cancer - Sister lupus - Sister sickle cell trait - Runs in Family   SOCIAL HISTORY: Marital Status: Married Preferred Language: English; Race: Black or African American Current Smoking Status: Patient smokes. Has smoked since 04/06/1986. Smokes 1 pack per day.   Tobacco Use Assessment Completed: Used Tobacco in last 30 days? Has never drank.  Does not drink caffeine. Patient's occupation is/was Retired- [employer].    REVIEW OF SYSTEMS:    GU Review Male:   Patient denies frequent urination, hard to postpone urination, burning/ pain with urination, get up at night to urinate, leakage of urine, stream starts and stops, trouble starting your stream, have to strain to urinate , erection problems, and penile pain.  Gastrointestinal (Upper):   Patient denies nausea, vomiting, and indigestion/ heartburn.  Gastrointestinal (Lower):   Patient denies diarrhea and constipation.  Constitutional:   Patient denies fever, night sweats, weight loss, and fatigue.  Skin:   Patient denies skin rash/ lesion and itching.  Eyes:   Patient denies blurred vision and double vision.  Ears/ Nose/ Throat:   Patient denies sore throat and sinus problems.  Hematologic/Lymphatic:   Patient denies swollen glands and easy bruising.  Cardiovascular:   Patient denies leg swelling and chest pains.  Respiratory:   Patient denies cough and shortness of breath.  Endocrine:   Patient denies excessive thirst.  Musculoskeletal:   Patient denies back pain and joint pain.  Neurological:   Patient denies headaches and dizziness.  Psychologic:   Patient denies depression and anxiety.   Notes: retention      VITAL SIGNS:      08/11/2023 01:14 PM  BP 174/103 mmHg  Pulse 92 /min  Temperature 99.3 F / 37.3 C   GU PHYSICAL EXAMINATION:    Urethral Meatus: Normal size. No lesion, no wart, no discharge, no polyp. Normal  location.  Penis: Penis uncircumcised. No foreskin warts, no cracks. No dorsal peyronie's plaques, no left corporal peyronie's plaques, no right corporal peyronie's plaques, no scarring, no shaft warts. No balanitis, no meatal stenosis.    MULTI-SYSTEM PHYSICAL EXAMINATION:    Constitutional: Well-nourished. No physical deformities. Normally developed. Good grooming. Appears uncomfortable.  Neck: Neck symmetrical, not swollen. Normal tracheal position.  Respiratory: No labored breathing, no use of accessory muscles.   Cardiovascular: Normal temperature, normal extremity pulses, no swelling, no varicosities.  Skin: No paleness, no jaundice, no cyanosis. No lesion, no ulcer, no rash.  Neurologic / Psychiatric: Oriented to time, oriented to place, oriented to person. No depression, no anxiety, no agitation.  Gastrointestinal: No mass, no tenderness, no rigidity, non obese abdomen. Mild suprapubic distention, resolved following catheter placement.  Musculoskeletal: Normal gait and station of head and neck.     Complexity of Data:  Source Of History:  Patient, Family/Caregiver, Medical Record Summary  Lab Test Review:   PSA  Records Review:   Pathology Reports, Previous Doctor Records, Previous Hospital Records, Previous Patient Records  Urine Test Review:   Urinalysis, Urine Culture  Urodynamics Review:   Review Bladder Scan   09/02/22 08/27/21 02/25/21 06/14/20 11/20/19 05/18/19 11/11/18 06/29/18  PSA  Total PSA <0.015 ng/mL <0.015 ng/mL <0.015 ng/mL <0.015 ng/mL <0.015 ng/mL 0.01 ng/mL <0.015 ng/mL <0.015 ng/mL    05/18/19 11/11/18 06/29/18 12/22/17  Hormones  Testosterone , Total 351.2 ng/dL 578.4 ng/dL 69.6 ng/dL 29.5 ng/dL    PROCEDURES:         PVR Ultrasound - 28413  Scanned Volume: 668 cc        Simple Foley Catheterization - 51702  A 18 French coud Foley catheter was inserted into the bladder using sterile technique. The patient was taught routine catheter care. A urinalysis  was sent to the lab. A urine culture was sent to the lab. A leg bag was connected. 700 cc of urine was obtained. Patient was provided an additional bedside bag to take home.         Visit Complexity - G2211    ASSESSMENT:      ICD-10 Details  1 GU:   Urinary Retention - R33.8 Chronic, Exacerbation   PLAN:           Orders Labs Urine Culture CATH          Schedule Return Visit/Planned Activity: Keep Scheduled Appointment - Follow up MD, Schedule Surgery          Document Letter(s):  Created for Patient: Clinical Summary         Notes:   Catheter placed briskly draining clear-yellow urine bring immediate relief to the patient. Urine culture was sent. I will update Dr. Rozanne Corners regarding today's visit. If felt to be appropriate, he will return prior to his upcoming surgery on 5/19 for trial of void. Otherwise he will simply keep the catheter until the surgery date.        Next Appointment:      Next Appointment: 08/23/2023 03:15 PM    Appointment Type: Surgery  Location: [facility] [phone]    Provider: Florencio Hunting, M.D.    Reason for Visit: [facility]/OP Julene Oaks BALLOON DILATION      * Signed by Williemae Harsh, NP on 08/11/23 at 2:04 PM (EDT)*

## 2023-08-23 NOTE — Anesthesia Procedure Notes (Signed)
 Procedure Name: LMA Insertion Date/Time: 08/23/2023 2:41 PM  Performed by: Josetta Niece, CRNAPre-anesthesia Checklist: Patient identified, Emergency Drugs available, Suction available and Patient being monitored Patient Re-evaluated:Patient Re-evaluated prior to induction Oxygen Delivery Method: Circle System Utilized Preoxygenation: Pre-oxygenation with 100% oxygen Induction Type: IV induction Ventilation: Mask ventilation without difficulty LMA: LMA inserted LMA Size: 4.0 Number of attempts: 1 Placement Confirmation: positive ETCO2 Tube secured with: Tape Dental Injury: Teeth and Oropharynx as per pre-operative assessment

## 2023-08-24 ENCOUNTER — Encounter (HOSPITAL_COMMUNITY): Payer: Self-pay | Admitting: Urology

## 2023-08-24 NOTE — Anesthesia Postprocedure Evaluation (Signed)
 Anesthesia Post Note  Patient: [name]  Procedure(s) Performed: CYSTOSCOPY, WITH URETHRAL DILATION (Bladder)     Patient location during evaluation: PACU Anesthesia Type: General Level of consciousness: awake and alert Pain management: pain level controlled Vital Signs Assessment: post-procedure vital signs reviewed and stable Respiratory status: spontaneous breathing, nonlabored ventilation, respiratory function stable and patient connected to nasal cannula oxygen Cardiovascular status: blood pressure returned to baseline and stable Postop Assessment: no apparent nausea or vomiting Anesthetic complications: no  No notable events documented.  Last Vitals:  Vitals:   08/23/23 1545 08/23/23 1600  BP: 120/80 116/76  Pulse: 71 73  Resp: (!) 21 14  Temp:    SpO2: 98% 99%    Last Pain:  Vitals:   08/23/23 1600  TempSrc:   PainSc: 0-No pain                 Estephani Popper L Jaydy Fitzhenry

## 2023-09-11 ENCOUNTER — Ambulatory Visit
Admission: RE | Admit: 2023-09-11 | Discharge: 2023-09-11 | Disposition: A | Discharge status: Home / Self Care | Source: Ambulatory Visit | Attending: Sports Medicine | Admitting: Sports Medicine

## 2023-09-11 DIAGNOSIS — M545 Low back pain, unspecified: Secondary | ICD-10-CM

## 2023-09-11 MED ORDER — GADOPICLENOL 0.5 MMOL/ML IV SOLN
10.0000 mL | Freq: Once | INTRAVENOUS | Status: AC | PRN
  Administered 2023-09-11: 10 mL via INTRAVENOUS

## 2023-09-20 ENCOUNTER — Other Ambulatory Visit: Payer: Self-pay | Admitting: Physician Assistant

## 2023-10-25 ENCOUNTER — Other Ambulatory Visit: Payer: Self-pay

## 2023-10-25 DIAGNOSIS — Z122 Encounter for screening for malignant neoplasm of respiratory organs: Secondary | ICD-10-CM

## 2023-10-25 DIAGNOSIS — Z87891 Personal history of nicotine dependence: Secondary | ICD-10-CM

## 2023-10-25 DIAGNOSIS — F17210 Nicotine dependence, cigarettes, uncomplicated: Secondary | ICD-10-CM

## 2023-11-02 ENCOUNTER — Inpatient Hospital Stay
Admission: RE | Admit: 2023-11-02 | Discharge: 2023-11-02 | Disposition: A | Discharge status: Home / Self Care | Payer: PRIVATE HEALTH INSURANCE | Source: Ambulatory Visit | Attending: Acute Care | Admitting: Acute Care

## 2023-11-02 DIAGNOSIS — F17210 Nicotine dependence, cigarettes, uncomplicated: Secondary | ICD-10-CM

## 2023-11-02 DIAGNOSIS — Z87891 Personal history of nicotine dependence: Secondary | ICD-10-CM

## 2023-11-02 DIAGNOSIS — Z122 Encounter for screening for malignant neoplasm of respiratory organs: Secondary | ICD-10-CM

## 2023-11-04 ENCOUNTER — Other Ambulatory Visit: Payer: Self-pay | Admitting: *Deleted

## 2023-11-04 MED ORDER — ROSUVASTATIN CALCIUM 40 MG PO TABS: 40.0000 mg | ORAL_TABLET | Freq: Every day | ORAL | 0 refills | Status: DC

## 2023-11-04 MED ORDER — EZETIMIBE 10 MG PO TABS: 10.0000 mg | ORAL_TABLET | Freq: Every day | ORAL | 0 refills | Status: DC

## 2023-11-04 NOTE — Telephone Encounter (Signed)
 Requested Prescriptions   Signed Prescriptions Disp Refills   ezetimibe  (ZETIA ) 10 MG tablet 90 tablet 0    Sig: Take 1 tablet (10 mg total) by mouth daily.    Authorizing Provider: PARTHENIA OLIVIA HERO    Ordering User: Maizy Davanzo  C   rosuvastatin  (CRESTOR ) 40 MG tablet 90 tablet 0    Sig: Take 1 tablet (40 mg total) by mouth daily.    Authorizing Provider: PARTHENIA OLIVIA HERO    Ordering User: [email or web address], Mitch Arquette  C

## 2023-11-08 ENCOUNTER — Other Ambulatory Visit: Payer: Self-pay

## 2023-11-08 DIAGNOSIS — Z87891 Personal history of nicotine dependence: Secondary | ICD-10-CM

## 2023-11-08 DIAGNOSIS — Z122 Encounter for screening for malignant neoplasm of respiratory organs: Secondary | ICD-10-CM

## 2023-11-16 ENCOUNTER — Telehealth: Payer: Self-pay | Admitting: Cardiovascular Disease

## 2023-11-16 MED ORDER — EZETIMIBE 10 MG PO TABS: 10.0000 mg | ORAL_TABLET | Freq: Every day | ORAL | 0 refills | Status: DC

## 2023-11-16 NOTE — Telephone Encounter (Signed)
*  STAT* If patient is at the pharmacy, call can be transferred to refill team.   1. Which medications need to be refilled? (please list name of each medication and dose if known)   ezetimibe  (ZETIA ) 10 MG tablet    2. Which pharmacy/location (including street and city if local pharmacy) is medication to be sent to? [id number] - , [employer] - [address]   3. Do they need a 30 day or 90 day supply? 90

## 2024-01-27 ENCOUNTER — Other Ambulatory Visit: Payer: Self-pay | Admitting: Physician Assistant

## 2024-02-07 NOTE — Progress Notes (Signed)
 Cardiology Office Note:  .   Date:  02/14/2024  ID:  Xavier Goodwin, DOB 28-Aug-1960, MRN [id number] PCP: Leonel Cole, MD  [facility] Providers Cardiologist:  Maude Emmer, MD    History of Present Illness: .   Xavier Goodwin is a 63 y.o. male   with history of chest pain, normal NST 2015, NST 2017 EF 48% ? Lat wall ischemia, cath 11/2015 50% ramus, 30% Cfx, calcium  score 266 81st percentile age and sex 10/2015, tobacco abuse.   Saw me in 11/2017 and was still smoking. Plan to repeat calcium  score 2020 not done.  I saw the patient 02/03/22 and was having trouble with his kidneys. NM renal image shows markedly impaired right renal function-7% with normal left.  Patient comes in for f/u. Runs a research scientist (medical). No chest pain, dyspnea, palpitations, edema. No regular exercise outside of work.  Indicates finally quit smoking 05/2023 after 48 packyears. Sees Camie Lites with pulmonary Lung cancer CT done 11/08/23 no cancer  Follows with oncology/urology for prostate cancer post radical prostatectomy and XRT. Also Rx for urethral stricture Limited right kidney function from hydronephrosis and stone. Which ultimately removed      ROS:    Studies Reviewed: SABRA         Prior CV Studies:    NM renal flow 06/2021 IMPRESSION: Normal LEFT renogram.   Markedly impaired/diminished RIGHT renal function with a mildly dilated collecting system and poor uptake/clearance of tracer over the course of the study.   Markedly asymmetric renal function, 93% LEFT versus 7% RIGHT.     Electronically Signed   By: Oneil Kiss M.D.   On: 06/18/2021 14:11 Coronary calcium  score 10/16/15 FINDINGS: Non-cardiac: No significant non cardiac findings on limited lung and soft tissue windows. See separate report from [facility]. Ascending Aorta:  3.6 cm Pericardium: Normal Coronary arteries: Calcium  noted in distal LM, LAD RCA and distal circumflex IMPRESSION: Coronary calcium   score of 266. This was 81st percentile for age and sex matched control. Maude Emmer Electronically Signed   By: Maude Emmer M.D.   On: 10/21/2015 10:32   Cardiac cath 11/25/15 Ramus lesion, 50 %stenosed. Prox Cx lesion, 30 %stenosed.   Myovue abnormal in basal lateral wall no lesion to correspond 50% ostial small IM and 30% calcified proximal LAD Medical Rx. Should not have any flow limiting lesions to cause angina    Risk Assessment/Calculations:             Physical Exam:   VS:  BP 116/76 (BP Location: Left Arm, Patient Position: Sitting)   Pulse 67   Ht 6' 1 (1.854 m)   Wt 220 lb 3.2 oz (99.9 kg)   SpO2 98%   BMI 29.05 kg/m    Wt Readings from Last 3 Encounters:  02/14/24 220 lb 3.2 oz (99.9 kg)  08/23/23 213 lb (96.6 kg)  08/11/23 213 lb (96.6 kg)    Affect appropriate Healthy:  appears stated age HEENT: normal Neck supple with no adenopathy JVP normal no bruits no thyromegaly Lungs clear with no wheezing and good diaphragmatic motion Heart:  S1/S2 no murmur, no rub, gallop or click PMI normal Abdomen: benighn, BS positve, no tenderness, no AAA no bruit.  No HSM or HJR Distal pulses intact with no bruits No edema Neuro non-focal Skin warm and dry No muscular weakness   ASSESSMENT AND PLAN: .     CAD with 50% ramus, 30% prox Cfx cath 2018-no angina, continue  ASA, crestor  and zetia . Active with no chest pain   HLD LDL at goal 06/2022  on Crestor  40 mg once daily, and zetia     Tobacco abuse quit 05/2023 f/u [facility] CT no cancer 11/02/23   Urology:  prostate cancer post XRT/surgery chronic right hydronephrosis from stone-> removed but only 7% right kidney function F/U urology for BMET/PSA    Refill Zetia     Dispo:  F/U in a year   Signed, Maude Emmer, MD

## 2024-02-11 ENCOUNTER — Other Ambulatory Visit: Payer: Self-pay | Admitting: Physician Assistant

## 2024-02-14 ENCOUNTER — Ambulatory Visit: Attending: Cardiovascular Disease | Admitting: Cardiovascular Disease

## 2024-02-14 ENCOUNTER — Encounter: Payer: Self-pay | Admitting: Cardiovascular Disease

## 2024-02-14 VITALS — BP 116/76 | HR 67 | Ht 73.0 in | Wt 220.2 lb

## 2024-02-14 DIAGNOSIS — E785 Hyperlipidemia, unspecified: Secondary | ICD-10-CM | POA: Insufficient documentation

## 2024-02-14 DIAGNOSIS — I2510 Atherosclerotic heart disease of native coronary artery without angina pectoris: Secondary | ICD-10-CM | POA: Insufficient documentation

## 2024-02-14 DIAGNOSIS — Z720 Tobacco use: Secondary | ICD-10-CM | POA: Diagnosis present

## 2024-02-14 MED ORDER — EZETIMIBE 10 MG PO TABS: 10.0000 mg | ORAL_TABLET | Freq: Every day | ORAL | 3 refills | Status: AC

## 2024-02-14 NOTE — Patient Instructions (Signed)
 Medication Instructions:  Your physician recommends that you continue on your current medications as directed. Please refer to the Current Medication list given to you today.  *If you need a refill on your cardiac medications before your next appointment, please call your pharmacy*  Lab Work: NONE ordered at this time of appointment   Testing/Procedures: NONE ordered at this time of appointment   Follow-Up: At [facility], you and your health needs are our priority.  As part of our continuing mission to provide you with exceptional heart care, our providers are all part of one team.  This team includes your primary Cardiologist (physician) and Advanced Practice Providers or APPs (Physician Assistants and Nurse Practitioners) who all work together to provide you with the care you need, when you need it.  Your next appointment:   1 year(s)  Provider:   Maude Emmer, MD    We recommend signing up for the patient portal called MyChart.  Sign up information is provided on this After Visit Summary.  MyChart is used to connect with patients for Virtual Visits (Telemedicine).  Patients are able to view lab/test results, encounter notes, upcoming appointments, etc.  Non-urgent messages can be sent to your provider as well.   To learn more about what you can do with MyChart, go to [email or web address].

## 2024-05-09 ENCOUNTER — Other Ambulatory Visit: Payer: Self-pay | Admitting: Cardiovascular Disease

## 2024-05-09 MED ORDER — ROSUVASTATIN CALCIUM 40 MG PO TABS: 40.0000 mg | ORAL_TABLET | Freq: Every day | ORAL | 0 refills | Status: AC

## 2024-05-09 NOTE — Telephone Encounter (Signed)
 In accordance with refill protocols, please review and address the following requirements before this medication refill can be authorized:  Labs     Lipid Panel within 12 months Last done 2023

## 2024-05-11 ENCOUNTER — Other Ambulatory Visit: Payer: Self-pay | Admitting: Cardiovascular Disease
